# Patient Record
Sex: Female | Born: 1950 | Race: White | Hispanic: No | Smoking: Former smoker
Health system: Southern US, Community
[De-identification: ages and names within clinical notes are randomized; demographics above are authoritative.]

## PROBLEM LIST (undated history)

## (undated) DIAGNOSIS — R599 Enlarged lymph nodes, unspecified: Secondary | ICD-10-CM

## (undated) DIAGNOSIS — K219 Gastro-esophageal reflux disease without esophagitis: Secondary | ICD-10-CM

## (undated) DIAGNOSIS — C801 Malignant (primary) neoplasm, unspecified: Secondary | ICD-10-CM

## (undated) DIAGNOSIS — H269 Unspecified cataract: Secondary | ICD-10-CM

## (undated) DIAGNOSIS — E785 Hyperlipidemia, unspecified: Secondary | ICD-10-CM

## (undated) HISTORY — DX: Hyperlipidemia, unspecified: E78.5

## (undated) HISTORY — DX: Gastro-esophageal reflux disease without esophagitis: K21.9

## (undated) HISTORY — DX: Unspecified cataract: H26.9

## (undated) HISTORY — PX: COLONOSCOPY: SHX174

## (undated) HISTORY — PX: RADIAL KERATOTOMY: SHX217

## (undated) HISTORY — PX: BREAST SURGERY: SHX581

## (undated) HISTORY — DX: Enlarged lymph nodes, unspecified: R59.9

## (undated) HISTORY — DX: Malignant (primary) neoplasm, unspecified: C80.1

---

## 1997-10-04 LAB — HM HEPATITIS C SCREENING LAB: HM HEPATITIS C SCREENING: NEGATIVE

## 1999-10-05 DIAGNOSIS — C801 Malignant (primary) neoplasm, unspecified: Secondary | ICD-10-CM

## 1999-10-05 HISTORY — DX: Malignant (primary) neoplasm, unspecified: C80.1

## 2000-08-17 ENCOUNTER — Encounter (INDEPENDENT_AMBULATORY_CARE_PROVIDER_SITE_OTHER): Payer: Self-pay | Admitting: Specialist

## 2000-08-17 ENCOUNTER — Other Ambulatory Visit: Admission: RE | Admit: 2000-08-17 | Discharge: 2000-08-17 | Payer: Self-pay | Admitting: Radiology

## 2000-09-22 ENCOUNTER — Other Ambulatory Visit: Admission: RE | Admit: 2000-09-22 | Discharge: 2000-09-22 | Payer: Self-pay | Admitting: Surgery

## 2000-09-30 ENCOUNTER — Encounter: Admission: RE | Admit: 2000-09-30 | Discharge: 2000-12-29 | Payer: Self-pay | Admitting: Radiation Oncology

## 2000-10-10 ENCOUNTER — Other Ambulatory Visit: Admission: RE | Admit: 2000-10-10 | Discharge: 2000-10-10 | Payer: Self-pay | Admitting: Surgery

## 2000-10-10 ENCOUNTER — Encounter (INDEPENDENT_AMBULATORY_CARE_PROVIDER_SITE_OTHER): Payer: Self-pay | Admitting: Specialist

## 2003-05-14 ENCOUNTER — Other Ambulatory Visit: Admission: RE | Admit: 2003-05-14 | Discharge: 2003-05-14 | Payer: Self-pay | Admitting: Obstetrics and Gynecology

## 2004-05-25 ENCOUNTER — Other Ambulatory Visit: Admission: RE | Admit: 2004-05-25 | Discharge: 2004-05-25 | Payer: Self-pay | Admitting: Obstetrics and Gynecology

## 2004-07-27 ENCOUNTER — Other Ambulatory Visit: Admission: RE | Admit: 2004-07-27 | Discharge: 2004-07-27 | Payer: Self-pay | Admitting: Radiology

## 2005-02-12 ENCOUNTER — Ambulatory Visit: Payer: Self-pay | Admitting: Oncology

## 2005-05-31 ENCOUNTER — Other Ambulatory Visit: Admission: RE | Admit: 2005-05-31 | Discharge: 2005-05-31 | Payer: Self-pay | Admitting: Obstetrics and Gynecology

## 2005-09-28 ENCOUNTER — Ambulatory Visit: Payer: Self-pay | Admitting: Oncology

## 2006-06-27 ENCOUNTER — Other Ambulatory Visit: Admission: RE | Admit: 2006-06-27 | Discharge: 2006-06-27 | Payer: Self-pay | Admitting: Obstetrics and Gynecology

## 2006-09-22 ENCOUNTER — Ambulatory Visit: Payer: Self-pay | Admitting: Oncology

## 2006-10-04 DIAGNOSIS — N893 Dysplasia of vagina, unspecified: Secondary | ICD-10-CM | POA: Insufficient documentation

## 2006-10-31 LAB — COMPREHENSIVE METABOLIC PANEL
Alkaline Phosphatase: 51 U/L (ref 39–117)
Glucose, Bld: 135 mg/dL — ABNORMAL HIGH (ref 70–99)
Sodium: 139 mEq/L (ref 135–145)
Total Bilirubin: 0.4 mg/dL (ref 0.3–1.2)
Total Protein: 6.4 g/dL (ref 6.0–8.3)

## 2006-10-31 LAB — CBC WITH DIFFERENTIAL/PLATELET
Eosinophils Absolute: 0.1 10*3/uL (ref 0.0–0.5)
LYMPH%: 28.7 % (ref 14.0–48.0)
MCH: 31.5 pg (ref 26.0–34.0)
MCHC: 34.2 g/dL (ref 32.0–36.0)
MCV: 92.1 fL (ref 81.0–101.0)
MONO%: 5.1 % (ref 0.0–13.0)
NEUT#: 4.7 10*3/uL (ref 1.5–6.5)
Platelets: 219 10*3/uL (ref 145–400)
RBC: 3.79 10*6/uL (ref 3.70–5.32)

## 2006-10-31 LAB — LACTATE DEHYDROGENASE: LDH: 151 U/L (ref 94–250)

## 2007-08-07 ENCOUNTER — Other Ambulatory Visit: Admission: RE | Admit: 2007-08-07 | Discharge: 2007-08-07 | Payer: Self-pay | Admitting: Obstetrics and Gynecology

## 2012-02-24 ENCOUNTER — Encounter: Payer: Self-pay | Admitting: Internal Medicine

## 2012-02-24 ENCOUNTER — Ambulatory Visit (INDEPENDENT_AMBULATORY_CARE_PROVIDER_SITE_OTHER): Payer: BC Managed Care – PPO | Admitting: Internal Medicine

## 2012-02-24 VITALS — BP 126/78 | HR 70 | Temp 97.8°F | Resp 18 | Ht 67.0 in | Wt 174.2 lb

## 2012-02-24 DIAGNOSIS — Z789 Other specified health status: Secondary | ICD-10-CM

## 2012-02-24 DIAGNOSIS — C50919 Malignant neoplasm of unspecified site of unspecified female breast: Secondary | ICD-10-CM | POA: Insufficient documentation

## 2012-02-24 DIAGNOSIS — F39 Unspecified mood [affective] disorder: Secondary | ICD-10-CM

## 2012-02-24 DIAGNOSIS — J301 Allergic rhinitis due to pollen: Secondary | ICD-10-CM

## 2012-02-24 DIAGNOSIS — J019 Acute sinusitis, unspecified: Secondary | ICD-10-CM

## 2012-02-24 DIAGNOSIS — R05 Cough: Secondary | ICD-10-CM

## 2012-02-24 DIAGNOSIS — J329 Chronic sinusitis, unspecified: Secondary | ICD-10-CM

## 2012-02-24 DIAGNOSIS — R5383 Other fatigue: Secondary | ICD-10-CM

## 2012-02-24 LAB — POCT CBC
Granulocyte percent: 54.2 %G (ref 37–80)
HCT, POC: 41.1 % (ref 37.7–47.9)
Hemoglobin: 13.6 g/dL (ref 12.2–16.2)
Lymph, poc: 2.3 (ref 0.6–3.4)
MCH, POC: 30.8 pg (ref 27–31.2)
MCHC: 33.1 g/dL (ref 31.8–35.4)
MCV: 9.31 fL — AB (ref 80–97)
MID (cbc): 0.5 (ref 0–0.9)
MPV: 12 fL (ref 0–99.8)
POC Granulocyte: 3.3 (ref 2–6.9)
POC LYMPH PERCENT: 37.9 %L (ref 10–50)
POC MID %: 7.9 %M (ref 0–12)
Platelet Count, POC: 278 10*3/uL (ref 142–424)
RBC: 4.41 M/uL (ref 4.04–5.48)
RDW, POC: 13.5 %
WBC: 6 10*3/uL (ref 4.6–10.2)

## 2012-02-24 LAB — COMPREHENSIVE METABOLIC PANEL
ALT: 15 U/L (ref 0–35)
AST: 17 U/L (ref 0–37)
Albumin: 4.4 g/dL (ref 3.5–5.2)
Alkaline Phosphatase: 60 U/L (ref 39–117)
Calcium: 9.5 mg/dL (ref 8.4–10.5)
Chloride: 105 mEq/L (ref 96–112)
Creat: 0.68 mg/dL (ref 0.50–1.10)
Potassium: 4.4 mEq/L (ref 3.5–5.3)

## 2012-02-24 LAB — TSH: TSH: 2.267 u[IU]/mL (ref 0.350–4.500)

## 2012-02-24 MED ORDER — HYDROCODONE-ACETAMINOPHEN 7.5-500 MG/15ML PO SOLN: 5.0000 mL | Freq: Four times a day (QID) | ORAL | Status: AC | PRN

## 2012-02-24 MED ORDER — FLUTICASONE PROPIONATE 50 MCG/ACT NA SUSP: 2.0000 | Freq: Every day | NASAL | Status: DC

## 2012-02-24 MED ORDER — CEFTRIAXONE SODIUM 1 G IJ SOLR
1.0000 g | INTRAMUSCULAR | Status: DC
  Administered 2012-02-24: 1 g via INTRAMUSCULAR

## 2012-02-24 MED ORDER — AMOXICILLIN 500 MG PO CAPS: 1000.0000 mg | ORAL_CAPSULE | Freq: Two times a day (BID) | ORAL | Status: AC

## 2012-02-24 NOTE — Patient Instructions (Signed)

## 2012-02-24 NOTE — Progress Notes (Signed)
  Subjective:    Patient ID: Theresa Pena, female    DOB: 1951/01/13, 61 y.o.   MRN: [id number]  HPI Has had congestion and fatigue, has spring allergies, no smoke and no asthma. Has copious reen foul discharge out of Right nostril only   Review of Systems Healthy    Objective:   Physical Exam  Constitutional: She is oriented to person, place, and time. She appears well-developed and well-nourished.  HENT:  Right Ear: External ear normal.  Left Ear: External ear normal.  Nose: Mucosal edema, rhinorrhea and sinus tenderness present. Right sinus exhibits maxillary sinus tenderness.  Mouth/Throat: Oropharynx is clear and moist.  Cardiovascular: Normal rate, regular rhythm and normal heart sounds.   Pulmonary/Chest: Effort normal and breath sounds normal.  Neurological: She is alert and oriented to person, place, and time. Coordination normal.  Skin: Skin is warm and dry.  Psychiatric: She has a normal mood and affect.   Cbc,cmet,tsh Results for orders placed in visit on 02/24/12  POCT CBC      Component Value Range   WBC 6.0  4.6 - 10.2 (K/uL)   Lymph, poc 2.3  0.6 - 3.4    POC LYMPH PERCENT 37.9  10 - 50 (%L)   MID (cbc) 0.5  0 - 0.9    POC MID % 7.9  0 - 12 (%M)   POC Granulocyte 3.3  2 - 6.9    Granulocyte percent 54.2  37 - 80 (%G)   RBC 4.41  4.04 - 5.48 (M/uL)   Hemoglobin 13.6  12.2 - 16.2 (g/dL)   HCT, POC 14.7  82.9 - 47.9 (%)   MCV 9.31 (*) 80 - 97 (fL)   MCH, POC 30.8  27 - 31.2 (pg)   MCHC 33.1  31.8 - 35.4 (g/dL)   RDW, POC 56.2     Platelet Count, POC 278  142 - 424 (K/uL)   MPV 12.0  0 - 99.8 (fL)         Assessment & Plan:  Schedule CPE soon Rocephin 1g Lortab elixir, amoxil1g

## 2013-08-24 ENCOUNTER — Ambulatory Visit (INDEPENDENT_AMBULATORY_CARE_PROVIDER_SITE_OTHER): Payer: BC Managed Care – PPO | Admitting: Physician Assistant

## 2013-08-24 VITALS — BP 106/64 | HR 68 | Temp 99.0°F | Resp 18 | Ht 66.75 in | Wt 179.0 lb

## 2013-08-24 DIAGNOSIS — H6990 Unspecified Eustachian tube disorder, unspecified ear: Secondary | ICD-10-CM

## 2013-08-24 DIAGNOSIS — J3489 Other specified disorders of nose and nasal sinuses: Secondary | ICD-10-CM

## 2013-08-24 DIAGNOSIS — R0981 Nasal congestion: Secondary | ICD-10-CM

## 2013-08-24 DIAGNOSIS — H6993 Unspecified Eustachian tube disorder, bilateral: Secondary | ICD-10-CM

## 2013-08-24 DIAGNOSIS — H6980 Other specified disorders of Eustachian tube, unspecified ear: Secondary | ICD-10-CM

## 2013-08-24 DIAGNOSIS — J329 Chronic sinusitis, unspecified: Secondary | ICD-10-CM

## 2013-08-24 DIAGNOSIS — H6983 Other specified disorders of Eustachian tube, bilateral: Secondary | ICD-10-CM

## 2013-08-24 MED ORDER — IPRATROPIUM BROMIDE 0.03 % NA SOLN: 2.0000 | Freq: Two times a day (BID) | NASAL | Status: DC

## 2013-08-24 MED ORDER — AMOXICILLIN-POT CLAVULANATE 875-125 MG PO TABS: 1.0000 | ORAL_TABLET | Freq: Two times a day (BID) | ORAL | Status: DC

## 2013-08-24 NOTE — Progress Notes (Signed)
  Subjective:    Patient ID: Theresa Pena, female    DOB: Apr 25, 1951, 62 y.o.   MRN: [id number]  Cough Associated symptoms include rhinorrhea. Pertinent negatives include no chest pain, chills, ear pain, fever, headaches, sore throat, shortness of breath or wheezing.   62 year old female presents for evaluation of nasal congestion, sinus pressure, bilateral ear pressure/popping, cough, and PND.  Denies fever, chills, nausea, vomiting, headache, dizziness, otalgia, sore throat, SOB, wheezing, or hemoptysis.  She has been taking OTC dayquil/nyquil which has not helped much.  She does have a hx of similar illness each fall. No known sick contacts.  Patient is otherwise doing well with no other concerns today.     Review of Systems  Constitutional: Negative for fever and chills.  HENT: Positive for congestion, rhinorrhea and sinus pressure. Negative for ear pain, sore throat and trouble swallowing.   Respiratory: Positive for cough. Negative for chest tightness, shortness of breath and wheezing.   Cardiovascular: Negative for chest pain.  Gastrointestinal: Negative for nausea, vomiting and abdominal pain.  Neurological: Negative for dizziness and headaches.       Objective:   Physical Exam  Constitutional: She is oriented to person, place, and time. She appears well-developed and well-nourished.  HENT:  Head: Normocephalic and atraumatic.  Right Ear: Hearing, external ear and ear canal normal. A middle ear effusion is present.  Left Ear: Hearing, external ear and ear canal normal. A middle ear effusion is present.  Mouth/Throat: Uvula is midline, oropharynx is clear and moist and mucous membranes are normal.  Eyes: Conjunctivae are normal.  Neck: Normal range of motion. Neck supple.  Cardiovascular: Normal rate, regular rhythm and normal heart sounds.   Pulmonary/Chest: Effort normal and breath sounds normal.  Lymphadenopathy:    She has no cervical adenopathy.  Neurological: She is  alert and oriented to person, place, and time.  Psychiatric: She has a normal mood and affect. Her behavior is normal. Judgment and thought content normal.          Assessment & Plan:  Nasal congestion - Plan: ipratropium (ATROVENT) 0.03 % nasal spray  Sinusitis - Plan: amoxicillin-clavulanate (AUGMENTIN) 875-125 MG per tablet  ETD (eustachian tube dysfunction), bilateral - Plan: ipratropium (ATROVENT) 0.03 % nasal spray  Will treat with Augmentin 875 mg bid x 10 days Atrovent NS twice daily to help with PND and ETD Recommend OTC Delsym for cough. Ok to rx tessalon perles if needed May also take Claritin or Zyrtec daily to help with ETD RTC if symptoms worsening or fail to improve.

## 2013-11-06 ENCOUNTER — Ambulatory Visit (INDEPENDENT_AMBULATORY_CARE_PROVIDER_SITE_OTHER): Payer: BC Managed Care – PPO | Admitting: Family Medicine

## 2013-11-06 VITALS — BP 122/76 | HR 88 | Temp 98.9°F | Resp 16 | Ht 66.0 in | Wt 179.0 lb

## 2013-11-06 DIAGNOSIS — R05 Cough: Secondary | ICD-10-CM

## 2013-11-06 DIAGNOSIS — J101 Influenza due to other identified influenza virus with other respiratory manifestations: Secondary | ICD-10-CM

## 2013-11-06 DIAGNOSIS — R059 Cough, unspecified: Secondary | ICD-10-CM

## 2013-11-06 LAB — POCT INFLUENZA A/B
Influenza A, POC: POSITIVE
Influenza B, POC: NEGATIVE

## 2013-11-06 MED ORDER — OSELTAMIVIR PHOSPHATE 75 MG PO CAPS: 75.0000 mg | ORAL_CAPSULE | Freq: Two times a day (BID) | ORAL | Status: DC

## 2013-11-06 MED ORDER — HYDROCODONE-HOMATROPINE 5-1.5 MG/5ML PO SYRP: 5.0000 mL | ORAL_SOLUTION | Freq: Three times a day (TID) | ORAL | Status: DC | PRN

## 2013-11-06 NOTE — Patient Instructions (Signed)

## 2013-11-06 NOTE — Progress Notes (Signed)
Subjective:  This chart was scribed for Robyn Haber, MD by Donato Schultz, Medical Scribe. This patient was seen in Room 5 and the patient's care was started at 8:38 PM.   Patient ID: Theresa Pena, female    DOB: 04/06/1951, 63 y.o.   MRN: [id number]  HPI HPI Comments: Theresa Pena is a 63 y.o. female who presents to the [facility] complaining of constant voice change and cough that started two days ago.  She states that her symptoms started with chills.  The patient lists abdominal pain and decreased appetite as associated symptoms.  She denies myalgias as an associated symptom.The patient denies getting the flu shot.       Past Medical History  Diagnosis Date   Cancer    Past Surgical History  Procedure Laterality Date   Breast surgery     Family History  Problem Relation Age of Onset   Hyperlipidemia Mother    History   Social History   Marital Status: Single    Spouse Name: N/A    Number of Children: N/A   Years of Education: N/A   Occupational History   Not on file.   Social History Main Topics   Smoking status: Current Every Day Smoker   Smokeless tobacco: Not on file   Alcohol Use: 3.0 oz/week    6 drink(s) per week   Drug Use: No   Sexual Activity: Not on file   Other Topics Concern   Not on file   Social History Narrative   No narrative on file   Allergies  Allergen Reactions   Codeine Nausea Only    Also has dizziness    Review of Systems  Constitutional: Positive for chills and appetite change (decreased).  HENT: Positive for voice change.   Respiratory: Positive for cough.   Gastrointestinal: Positive for abdominal pain.  Musculoskeletal: Negative for myalgias.  All other systems reviewed and are negative.       Objective:  Physical Exam  Nursing note and vitals reviewed. Constitutional: She is oriented to person, place, and time. She appears well-developed and well-nourished. No distress.    HENT:  Head: Normocephalic and atraumatic.  Eyes: EOM are normal.  Neck: Neck supple. No tracheal deviation present.  Cardiovascular: Normal rate.   Pulmonary/Chest: Effort normal and breath sounds normal. No respiratory distress.  Musculoskeletal: Normal range of motion.  Neurological: She is alert and oriented to person, place, and time.  Skin: Skin is warm and dry.  Psychiatric: She has a normal mood and affect. Her behavior is normal.   Chest:  insp and exp ronchi  Results for orders placed in visit on 11/06/13  POCT INFLUENZA A/B      Result Value Range   Influenza A, POC Positive     Influenza B, POC Negative         BP 122/76   Pulse 88   Temp(Src) 98.9 F (37.2 C) (Oral)   Resp 16   Ht 5\' 6"  (1.676 m)   Wt 179 lb (81.194 kg)   BMI 28.91 kg/m2   SpO2 97%  Assessment & Plan:    I personally performed the services described in this documentation, which was scribed in my presence. The recorded information has been reviewed and is accurate.  Influenza A (H1N1) - Plan: oseltamivir (TAMIFLU) 75 MG capsule, HYDROcodone-homatropine (HYCODAN) 5-1.5 MG/5ML syrup  Cough - Plan: POCT Influenza A/B, HYDROcodone-homatropine (HYCODAN) 5-1.5 MG/5ML syrup  Signed, Robyn Haber, MD

## 2015-02-17 ENCOUNTER — Ambulatory Visit: Payer: Self-pay | Admitting: Internal Medicine

## 2015-03-14 ENCOUNTER — Ambulatory Visit (INDEPENDENT_AMBULATORY_CARE_PROVIDER_SITE_OTHER): Payer: 59 | Admitting: Internal Medicine

## 2015-03-14 ENCOUNTER — Encounter: Payer: Self-pay | Admitting: Internal Medicine

## 2015-03-14 VITALS — BP 120/92 | HR 65 | Temp 98.2°F | Resp 18 | Ht 67.0 in | Wt 180.8 lb

## 2015-03-14 DIAGNOSIS — Z0000 Encounter for general adult medical examination without abnormal findings: Secondary | ICD-10-CM

## 2015-03-14 DIAGNOSIS — Z85820 Personal history of malignant melanoma of skin: Secondary | ICD-10-CM

## 2015-03-14 DIAGNOSIS — G4700 Insomnia, unspecified: Secondary | ICD-10-CM | POA: Diagnosis not present

## 2015-03-14 DIAGNOSIS — F411 Generalized anxiety disorder: Secondary | ICD-10-CM | POA: Diagnosis not present

## 2015-03-14 DIAGNOSIS — Z853 Personal history of malignant neoplasm of breast: Secondary | ICD-10-CM | POA: Insufficient documentation

## 2015-03-14 NOTE — Progress Notes (Signed)
Patient ID: Theresa Pena, female   DOB: 24-Mar-1951, 65 y.o.   MRN: [id number]    Location:    P[facility]    Place of Service:    OFFICE   Advanced Directive information   [facility] POA and Living Will  Chief Complaint  Patient presents with  . Establish Care    Establish care    HPI:  64 yo female seen today as a new pt. She has never had a primary care provider. She had diarrhea from mid Feb through mid May but it has resolved. No bloody stools. Last colonoscopy in her 76s and it she had 1 polyp removed that was benign. She had fatigue but that has resolved also. No N/V. She has IBS and had a duodenal ulcer in her 28s. She takes zegerid which helps. She was taking a lot of Magnesium but has reduced dose and that helped with diarrhea. She was gaining weight with diarrhea but that calmed down also. Now at baseline weight.   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 She needs referral to see derm for annual skin check.  She has a past hx right breast cancer in 2001 s/p lumpectomy x 2 and XRT. No chemotx req'd. She completed 5 yrs of tamoxifen tx. Her GYN is at [facility]. Last visit 2 weeks ago and exam nml. 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 Cancer     Past Surgical History  Procedure Laterality Date  . Breast surgery      Patient Care Team: Harle Battiest, MD as PCP - General (Obstetrics and Gynecology)  History   Social History  . Marital Status: Divorced    Spouse Name: N/A  . Number of Children: N/A  . Years of Education: N/A   Occupational History  . Not on file.   Social History Main Topics  . Smoking status: Former Research scientist (life sciences)  . Smokeless tobacco: Never Used  . Alcohol Use: 3.0 - 4.2 oz/week    5-7 Standard drinks or equivalent per week  . Drug Use: No  . Sexual Activity: Not on file   Other Topics Concern  . Not on file   Social History Narrative    Diet:      Do you drink/ eat things with caffeine? Coffee 2-3 cups /day      Marital status:  Divorced                             What year were you married ? 2004      Do you live in a house, apartment,assistred living, condo, trailer, etc.)? House      Is it one or more stories? 1      How many persons live in your home ? 1      Do you have any pets in your home ?(please list)  1 dog      Current or past profession: Residential cleaning      Do you exercise?                              Type & how often: Rarely      Do you have a living will? Yes      Do you have a DNR form?  No                    If not, do you want to discuss one? Yes      Do you have signed POA?HPOA forms?   Yes              If so, please bring to your        appointment           reports that she has quit smoking. She has never used smokeless tobacco. She reports that she drinks about 3.0 - 4.2 oz of alcohol per week. She reports that she does not use illicit drugs.  Family History  Problem Relation Age of Onset  . Hyperlipidemia Mother    Family Status  Relation Status Death Age  . Mother Alive   . Father Deceased   . Sister Alive   . Maternal Grandmother Deceased   . Maternal Grandfather Deceased   . Paternal Grandmother Deceased   . Paternal Grandfather Deceased      There is no immunization history on file for this patient.  Allergies  Allergen Reactions  . Codeine Nausea Only    Also has dizziness    Medications: Patient's Medications  New Prescriptions   No medications on file  Previous Medications   CHOLECALCIFEROL (VITAMIN D) 1000 UNITS TABLET    Take 1,000 Units by mouth daily. Patient unsure of dosage   FISH OIL-OMEGA-3 FATTY ACIDS 1000 MG CAPSULE    Take 1 g by mouth daily. Not sure why she takes this. Not working for fatigue   FLUTICASONE (FLONASE) 50 MCG/ACT NASAL SPRAY    Place 2 sprays into the nose daily.   LORAZEPAM (ATIVAN) 1 MG TABLET    Take 1 tablet by  mouth 3 times a day   MULTIPLE VITAMIN (MULTIVITAMIN) TABLET    Take 1 tablet by mouth daily.   RA CALCIUM HI-CAL PO    Take by mouth. Take 3 tablets 1 time daily   VENLAFAXINE (EFFEXOR) 37.5 MG TABLET    Take 37.5 mg by mouth 2 (two) times daily.   ZOLPIDEM (AMBIEN) 10 MG TABLET    Take 10 mg by mouth once.  Modified Medications   No medications on file  Discontinued Medications   CALCIUM CARBONATE (OS-CAL) 600 MG TABS    Take 600 mg by mouth 2 (two) times daily with a meal. Patient unsure of dosage   HYDROCODONE-HOMATROPINE (HYCODAN) 5-1.5 MG/5ML SYRUP    Take 5 mLs by mouth every 8 (eight) hours as needed for cough.   IPRATROPIUM (ATROVENT) 0.03 % NASAL SPRAY    Place 2 sprays into the nose 2 (two) times daily.   OSELTAMIVIR (TAMIFLU) 75 MG CAPSULE    Take 1 capsule (75 mg total) by mouth 2 (two) times daily.    Review of Systems  Constitutional: Positive for unexpected weight change (weight gain). Negative for fever, chills, diaphoresis, activity change, appetite change and fatigue.  HENT: Negative for ear pain and sore throat.   Eyes: Positive for visual disturbance (eyeglasses).       Dry eyes; cats  Respiratory: Negative for cough, chest tightness and shortness of breath.   Cardiovascular: Negative for chest pain, palpitations and leg swelling.  Gastrointestinal: Positive for diarrhea. Negative for nausea, vomiting, abdominal pain, constipation and blood in stool.  Genitourinary: Negative for dysuria.       Urinary incontinence  Musculoskeletal: Negative for arthralgias.       Joint  stiffness  Neurological: Positive for dizziness (occasional). Negative for tremors, numbness and headaches.  Psychiatric/Behavioral: Negative for sleep disturbance. The patient is not nervous/anxious.     Filed Vitals:   03/14/15 0823  BP: 120/92  Pulse: 65  Temp: 98.2 F (36.8 C)  TempSrc: Oral  Resp: 18  Height: 5\' 7"  (1.702 m)  Weight: 180 lb 12.8 oz (82.01 kg)  SpO2: 97%   Body mass  index is 28.31 kg/(m^2).  Physical Exam  Constitutional: She is oriented to person, place, and time. She appears well-developed and well-nourished.  HENT:  Mouth/Throat: Oropharynx is clear and moist. No oropharyngeal exudate.  Eyes: Pupils are equal, round, and reactive to light. No scleral icterus.  Neck: Neck supple. Carotid bruit is not present. No tracheal deviation present. No thyromegaly present.  Cardiovascular: Normal rate, regular rhythm, normal heart sounds and intact distal pulses.  Exam reveals no gallop and no friction rub.   No murmur heard. No LE edema b/l. no calf TTP.   Pulmonary/Chest: Effort normal and breath sounds normal. No stridor. No respiratory distress. She has no wheezes. She has no rales.  Abdominal: Soft. Bowel sounds are normal. She exhibits no distension and no mass. There is no hepatosplenomegaly. There is no tenderness. There is no rebound and no guarding.  Lymphadenopathy:    She has no cervical adenopathy.  Neurological: She is alert and oriented to person, place, and time. She has normal reflexes.  Skin: Skin is warm and dry. No rash noted.  Psychiatric: She has a normal mood and affect. Her behavior is normal. Judgment and thought content normal.     Labs reviewed: No visits with results within 3 Month(s) from this visit. Latest known visit with results is:  Office Visit on 11/06/2013  Component Date Value Ref Range Status  . Influenza A, POC 11/06/2013 Positive   Final  . Influenza B, POC 11/06/2013 Negative   Final    No results found.   Assessment/Plan   ICD-9-CM ICD-10-CM   1. History of melanoma V10.82 Z85.820 Ambulatory referral to Dermatology  2. Generalized anxiety disorder - stable 300.02 F41.1 CBC with Differential     CMP     TSH  3. Insomnia - stable 780.52 G47.00 TSH  4. History of breast cancer in female V10.3 Z85.3 CBC with Differential     CMP     TSH  5. Well adult exam V70.0 Z00.00 Lipid Panel   --Pt is UTD on  health maintenance. Vaccinations are UTD. Pt maintains a healthy lifestyle overall. Encouraged pt to exercise 30-45 minutes 4-5 times per week. Eat a well balanced diet. Avoid smoking. Limit alcohol intake. Wear seatbelt when riding in the car. Wear sun block (SPF >50) when spending extended times outside.  --Will call with referral appts  --Continue current medications as ordered  --bring copy of advanced directives to be scanned into chart  --Follow up in 1 yr for CPE w pap   Cylis Ayars S. Perlie Gold  [facility] [address] [place], [place] [address] [phone] Cell (Monday-Friday 8 AM - 5 PM) [phone] After 5 PM and follow prompts

## 2015-03-15 LAB — CBC WITH DIFFERENTIAL/PLATELET
Basophils Absolute: 0 10*3/uL (ref 0.0–0.2)
Basos: 1 %
EOS (ABSOLUTE): 0.1 10*3/uL (ref 0.0–0.4)
EOS: 2 %
Hematocrit: 41.2 % (ref 34.0–46.6)
Hemoglobin: 13.6 g/dL (ref 11.1–15.9)
IMMATURE GRANULOCYTES: 0 %
Immature Grans (Abs): 0 10*3/uL (ref 0.0–0.1)
Lymphocytes Absolute: 1.9 10*3/uL (ref 0.7–3.1)
Lymphs: 37 %
MCH: 30.8 pg (ref 26.6–33.0)
MCHC: 33 g/dL (ref 31.5–35.7)
MCV: 93 fL (ref 79–97)
MONOCYTES: 11 %
Monocytes Absolute: 0.6 10*3/uL (ref 0.1–0.9)
NEUTROS PCT: 49 %
Neutrophils Absolute: 2.6 10*3/uL (ref 1.4–7.0)
Platelets: 249 10*3/uL (ref 150–379)
RBC: 4.42 x10E6/uL (ref 3.77–5.28)
RDW: 13.9 % (ref 12.3–15.4)
WBC: 5.2 10*3/uL (ref 3.4–10.8)

## 2015-03-15 LAB — LIPID PANEL
Chol/HDL Ratio: 3.5 ratio units (ref 0.0–4.4)
Cholesterol, Total: 284 mg/dL — ABNORMAL HIGH (ref 100–199)
HDL: 82 mg/dL (ref >39)
LDL CALC: 182 mg/dL — AB (ref 0–99)
Triglycerides: 101 mg/dL (ref 0–149)
VLDL Cholesterol Cal: 20 mg/dL (ref 5–40)

## 2015-03-15 LAB — COMPREHENSIVE METABOLIC PANEL
ALBUMIN: 4.5 g/dL (ref 3.6–4.8)
ALT: 15 IU/L (ref 0–32)
AST: 22 IU/L (ref 0–40)
Albumin/Globulin Ratio: 1.7 (ref 1.1–2.5)
Alkaline Phosphatase: 51 IU/L (ref 39–117)
BUN / CREAT RATIO: 12 (ref 11–26)
BUN: 9 mg/dL (ref 8–27)
Bilirubin Total: 0.6 mg/dL (ref 0.0–1.2)
CO2: 26 mmol/L (ref 18–29)
Calcium: 9.5 mg/dL (ref 8.7–10.3)
Chloride: 99 mmol/L (ref 97–108)
Creatinine, Ser: 0.74 mg/dL (ref 0.57–1.00)
GFR calc non Af Amer: 86 mL/min/{1.73_m2} (ref >59)
GFR, EST AFRICAN AMERICAN: 99 mL/min/{1.73_m2} (ref >59)
GLUCOSE: 85 mg/dL (ref 65–99)
Globulin, Total: 2.6 g/dL (ref 1.5–4.5)
POTASSIUM: 4.5 mmol/L (ref 3.5–5.2)
Sodium: 139 mmol/L (ref 134–144)
Total Protein: 7.1 g/dL (ref 6.0–8.5)

## 2015-03-15 LAB — TSH: TSH: 3.48 u[IU]/mL (ref 0.450–4.500)

## 2015-03-16 NOTE — Patient Instructions (Addendum)
She maintains a healthy lifestyle overall. Encouraged her to exercise 30-45 minutes 4-5 times per week. Eat a well balanced diet. Avoid smoking. Limit alcohol intake. Wear seatbelt when riding in the car. Wear sun block (SPF >50) when spending extended times outside.  Will call with referral appts  Continue current medications as ordered  Bring copy of HC POA and living will to be scanned into system  Follow up in 1 yr for CPE

## 2015-03-20 ENCOUNTER — Other Ambulatory Visit: Payer: Self-pay | Admitting: *Deleted

## 2015-03-20 DIAGNOSIS — E785 Hyperlipidemia, unspecified: Secondary | ICD-10-CM

## 2015-03-20 MED ORDER — SIMVASTATIN 10 MG PO TABS: 10.0000 mg | ORAL_TABLET | Freq: Every day | ORAL | Status: DC

## 2015-03-20 NOTE — Telephone Encounter (Signed)
Medication orders and labs placed due to labwork results per Dr. Eulas Post

## 2015-06-05 ENCOUNTER — Other Ambulatory Visit: Payer: Medicare Other

## 2015-06-05 DIAGNOSIS — E785 Hyperlipidemia, unspecified: Secondary | ICD-10-CM | POA: Diagnosis not present

## 2015-06-06 LAB — ALT: ALT: 18 IU/L (ref 0–32)

## 2015-06-06 LAB — LIPID PANEL
CHOL/HDL RATIO: 3.1 ratio (ref 0.0–4.4)
Cholesterol, Total: 204 mg/dL — ABNORMAL HIGH (ref 100–199)
HDL: 65 mg/dL (ref >39)
LDL CALC: 116 mg/dL — AB (ref 0–99)
Triglycerides: 116 mg/dL (ref 0–149)
VLDL Cholesterol Cal: 23 mg/dL (ref 5–40)

## 2015-08-11 ENCOUNTER — Other Ambulatory Visit: Payer: Self-pay | Admitting: *Deleted

## 2015-08-11 MED ORDER — SIMVASTATIN 10 MG PO TABS: ORAL_TABLET | ORAL | Status: DC

## 2015-08-11 NOTE — Telephone Encounter (Signed)
[facility] 

## 2015-09-11 DIAGNOSIS — Z1231 Encounter for screening mammogram for malignant neoplasm of breast: Secondary | ICD-10-CM | POA: Diagnosis not present

## 2015-09-11 LAB — HM MAMMOGRAPHY

## 2015-09-12 ENCOUNTER — Encounter: Payer: Self-pay | Admitting: *Deleted

## 2016-01-30 ENCOUNTER — Encounter: Payer: Self-pay | Admitting: Internal Medicine

## 2016-01-30 ENCOUNTER — Ambulatory Visit (INDEPENDENT_AMBULATORY_CARE_PROVIDER_SITE_OTHER): Payer: Medicare Other | Admitting: Internal Medicine

## 2016-01-30 VITALS — BP 118/80 | HR 84 | Temp 99.2°F | Resp 20 | Ht 67.0 in | Wt 181.2 lb

## 2016-01-30 DIAGNOSIS — H6692 Otitis media, unspecified, left ear: Secondary | ICD-10-CM | POA: Diagnosis not present

## 2016-01-30 DIAGNOSIS — J301 Allergic rhinitis due to pollen: Secondary | ICD-10-CM | POA: Diagnosis not present

## 2016-01-30 MED ORDER — METHYLPREDNISOLONE 4 MG PO TBPK: ORAL_TABLET | ORAL | Status: DC

## 2016-01-30 MED ORDER — CEFUROXIME AXETIL 250 MG PO TABS: 250.0000 mg | ORAL_TABLET | Freq: Two times a day (BID) | ORAL | Status: DC

## 2016-01-30 MED ORDER — BENZONATATE 200 MG PO CAPS: 200.0000 mg | ORAL_CAPSULE | Freq: Three times a day (TID) | ORAL | Status: DC | PRN

## 2016-01-30 NOTE — Patient Instructions (Addendum)
Influenza test - negative  Push fluids and rest  Take prednisone taper as ordered  Take ceftin 2 times daily x 10 days  Recommend OTC plain claritin, allegra or zyrtec daily for seasonal allergy  Use saline nasal spray as needed to keep nose moist  Follow up as scheduled

## 2016-01-30 NOTE — Progress Notes (Signed)
Patient ID: Theresa Pena, female   DOB: 09-20-1951, 65 y.o.   MRN: [id number]    Location:    PAM   Place of Service:  OFFICE   Chief Complaint  Patient presents with  . Acute Visit    Patient c/o has bad cold and cough    HPI:  64 yo female seen today for cough and cold. She reports 1 day hx harsh cough, nonproductive. No f/c. Denies CP but has chronic SOB. She reports rhinorrhea,, sore throat. No HA/dizzinesss, f/c, nausea, sinus pressure/pain, ear pain/pressure, nasal congestion, post nasal drip. No sick contacts. Aleve helps. She has taken flonase in the past but no dx seasonal allergy. Hx breast CA  Past Medical History  Diagnosis Date  . Cancer [facility])     Past Surgical History  Procedure Laterality Date  . Breast surgery      Patient Care Team: Gildardo Cranker, DO as PCP - General (Internal Medicine)  Social History   Social History  . Marital Status: Divorced    Spouse Name: N/A  . Number of Children: N/A  . Years of Education: N/A   Occupational History  . Not on file.   Social History Main Topics  . Smoking status: Former Research scientist (life sciences)  . Smokeless tobacco: Never Used  . Alcohol Use: 3.0 - 4.2 oz/week    5-7 Standard drinks or equivalent per week  . Drug Use: No  . Sexual Activity: Not on file   Other Topics Concern  . Not on file   Social History Narrative   Diet:      Do you drink/ eat things with caffeine? Coffee 2-3 cups /day      Marital status:  Divorced                             What year were you married ? 2004      Do you live in a house, apartment,assistred living, condo, trailer, etc.)? House      Is it one or more stories? 1      How many persons live in your home ? 1      Do you have any pets in your home ?(please list)  1 dog      Current or past profession: Residential cleaning      Do you exercise?                              Type & how often: Rarely      Do you have a living will? Yes      Do you have a DNR form?   No                     If not, do you want to discuss one? Yes      Do you have signed POA?HPOA forms?   Yes              If so, please bring to your        appointment           reports that she has quit smoking. She has never used smokeless tobacco. She reports that she drinks about 3.0 - 4.2 oz of alcohol per week. She reports that she does not use illicit drugs.  Allergies  Allergen Reactions  . Codeine Nausea Only    Also has dizziness  Medications: Patient's Medications  New Prescriptions   No medications on file  Previous Medications   CHOLECALCIFEROL (VITAMIN D) 1000 UNITS TABLET    Take 1,000 Units by mouth daily. Patient unsure of dosage   FISH OIL-OMEGA-3 FATTY ACIDS 1000 MG CAPSULE    Take 1 g by mouth daily. Not sure why she takes this. Not working for fatigue   FLUTICASONE (FLONASE) 50 MCG/ACT NASAL SPRAY    Place 2 sprays into the nose daily.   LORAZEPAM (ATIVAN) 1 MG TABLET    Take 1 tablet by mouth 3 times a day   MULTIPLE VITAMIN (MULTIVITAMIN) TABLET    Take 1 tablet by mouth daily.   RA CALCIUM HI-CAL PO    Take by mouth. Take 3 tablets 1 time daily   SIMVASTATIN (ZOCOR) 10 MG TABLET    Take one tablet by mouth once daily for cholesterol   VENLAFAXINE (EFFEXOR) 37.5 MG TABLET    Take 37.5 mg by mouth 2 (two) times daily.   ZOLPIDEM (AMBIEN) 10 MG TABLET    Take 10 mg by mouth once.  Modified Medications   No medications on file  Discontinued Medications   No medications on file    Review of Systems  Constitutional: Positive for fatigue.  HENT: Positive for rhinorrhea and sore throat.   Respiratory: Positive for cough and shortness of breath.   All other systems reviewed and are negative.   Filed Vitals:   01/30/16 1034  BP: 118/80  Pulse: 84  Temp: 99.2 F (37.3 C)  TempSrc: Oral  Resp: 20  Height: 5\' 7"  (1.702 m)  Weight: 181 lb 3.2 oz (82.192 kg)  SpO2: 97%   Body mass index is 28.37 kg/(m^2).  Physical Exam  Constitutional: She is oriented to  person, place, and time. She appears well-developed and well-nourished.  Looks ill in NAD  HENT:  Right TM intact, nonbulging no redness. Left TM red, dull, nonbulging but intact. No sinus TTP. Nares with enlarged grey turbinates. Oropharynx cobblestoning and red but no exudate  Eyes: Pupils are equal, round, and reactive to light. Right eye exhibits no discharge. Left eye exhibits no discharge. No scleral icterus.  Neck: Neck supple.  Cardiovascular: Normal rate, regular rhythm, normal heart sounds and intact distal pulses.  Exam reveals no gallop and no friction rub.   No murmur heard. Pulmonary/Chest: No respiratory distress. She has no wheezes. She has no rales. She exhibits no tenderness.  Lymphadenopathy:    She has no cervical adenopathy.  Neurological: She is alert and oriented to person, place, and time.  Skin: Skin is warm and dry. No rash noted.     Labs reviewed: No visits with results within 3 Month(s) from this visit. Latest known visit with results is:  Abstract on 09/12/2015  Component Date Value Ref Range Status  . HM Mammogram 09/11/2015 [facility]: No mammographic evidence of malignancy   Final    No results found.   Assessment/Plan   ICD-9-CM ICD-10-CM   1. Acute left otitis media, recurrence not specified, unspecified otitis media type 382.9 H66.92 cefUROXime (CEFTIN) 250 MG tablet  2. Allergic rhinitis due to pollen 477.0 J30.1 methylPREDNISolone (MEDROL DOSEPAK) 4 MG TBPK tablet     benzonatate (TESSALON) 200 MG capsule    Rapid influenza test done - Neg  Push fluids and rest  Start ceftin 250mg  BID x 10 days  Start prednisone taper as ordered  Recommend OTC plain claritin, allegra or zyrtec daily for seasonal allergy  Use  saline nasal spray as needed to keep nose moist  Follow up as scheduled  Theresa Pena Theresa Pena  [facility] [address] [place], [place] [address] [phone] Cell  (Monday-Friday 8 AM - 5 PM) [phone] After 5 PM and follow prompts

## 2016-04-23 ENCOUNTER — Other Ambulatory Visit: Payer: Self-pay | Admitting: Internal Medicine

## 2016-05-24 ENCOUNTER — Other Ambulatory Visit: Payer: Self-pay | Admitting: Internal Medicine

## 2016-05-27 ENCOUNTER — Other Ambulatory Visit: Payer: Self-pay

## 2016-05-27 DIAGNOSIS — E785 Hyperlipidemia, unspecified: Secondary | ICD-10-CM

## 2016-05-27 DIAGNOSIS — Z5181 Encounter for therapeutic drug level monitoring: Secondary | ICD-10-CM

## 2016-06-03 DIAGNOSIS — H524 Presbyopia: Secondary | ICD-10-CM | POA: Diagnosis not present

## 2016-06-16 ENCOUNTER — Other Ambulatory Visit: Payer: Medicare Other

## 2016-06-16 DIAGNOSIS — Z5181 Encounter for therapeutic drug level monitoring: Secondary | ICD-10-CM

## 2016-06-16 DIAGNOSIS — E785 Hyperlipidemia, unspecified: Secondary | ICD-10-CM

## 2016-06-16 LAB — CBC WITH DIFFERENTIAL/PLATELET
Basophils Absolute: 57 cells/uL (ref 0–200)
Basophils Relative: 1 %
Eosinophils Absolute: 114 cells/uL (ref 15–500)
Eosinophils Relative: 2 %
HEMATOCRIT: 39.2 % (ref 35.0–45.0)
HEMOGLOBIN: 13.2 g/dL (ref 11.7–15.5)
LYMPHS ABS: 1938 {cells}/uL (ref 850–3900)
Lymphocytes Relative: 34 %
MCH: 31.1 pg (ref 27.0–33.0)
MCHC: 33.7 g/dL (ref 32.0–36.0)
MCV: 92.2 fL (ref 80.0–100.0)
MONO ABS: 570 {cells}/uL (ref 200–950)
MPV: 11.8 fL (ref 7.5–12.5)
Monocytes Relative: 10 %
NEUTROS PCT: 53 %
Neutro Abs: 3021 cells/uL (ref 1500–7800)
Platelets: 261 10*3/uL (ref 140–400)
RBC: 4.25 MIL/uL (ref 3.80–5.10)
RDW: 13.4 % (ref 11.0–15.0)
WBC: 5.7 10*3/uL (ref 3.8–10.8)

## 2016-06-16 LAB — COMPLETE METABOLIC PANEL WITH GFR
ALBUMIN: 3.9 g/dL (ref 3.6–5.1)
ALK PHOS: 43 U/L (ref 33–130)
ALT: 15 U/L (ref 6–29)
AST: 20 U/L (ref 10–35)
BILIRUBIN TOTAL: 0.6 mg/dL (ref 0.2–1.2)
BUN: 11 mg/dL (ref 7–25)
CALCIUM: 9.3 mg/dL (ref 8.6–10.4)
CO2: 27 mmol/L (ref 20–31)
CREATININE: 0.73 mg/dL (ref 0.50–0.99)
Chloride: 102 mmol/L (ref 98–110)
GFR, Est African American: >89 mL/min (ref >=60)
GFR, Est Non African American: 86 mL/min (ref >=60)
Glucose, Bld: 97 mg/dL (ref 65–99)
POTASSIUM: 4.7 mmol/L (ref 3.5–5.3)
Sodium: 139 mmol/L (ref 135–146)
TOTAL PROTEIN: 6.7 g/dL (ref 6.1–8.1)

## 2016-06-16 LAB — LIPID PANEL
CHOLESTEROL: 220 mg/dL — AB (ref 125–200)
HDL: 77 mg/dL (ref >=46)
LDL Cholesterol: 121 mg/dL (ref <130)
Total CHOL/HDL Ratio: 2.9 Ratio (ref <=5.0)
Triglycerides: 110 mg/dL (ref <150)
VLDL: 22 mg/dL (ref <30)

## 2016-06-16 LAB — TSH: TSH: 2.9 mIU/L

## 2016-06-18 ENCOUNTER — Encounter: Payer: Self-pay | Admitting: Internal Medicine

## 2016-06-18 ENCOUNTER — Ambulatory Visit (INDEPENDENT_AMBULATORY_CARE_PROVIDER_SITE_OTHER): Payer: Medicare Other | Admitting: Internal Medicine

## 2016-06-18 VITALS — BP 118/78 | HR 65 | Temp 98.3°F | Ht 66.54 in | Wt 181.4 lb

## 2016-06-18 DIAGNOSIS — S93401A Sprain of unspecified ligament of right ankle, initial encounter: Secondary | ICD-10-CM | POA: Diagnosis not present

## 2016-06-18 DIAGNOSIS — Z23 Encounter for immunization: Secondary | ICD-10-CM

## 2016-06-18 DIAGNOSIS — Z0000 Encounter for general adult medical examination without abnormal findings: Secondary | ICD-10-CM | POA: Diagnosis not present

## 2016-06-18 DIAGNOSIS — E2839 Other primary ovarian failure: Secondary | ICD-10-CM | POA: Diagnosis not present

## 2016-06-18 DIAGNOSIS — Z1211 Encounter for screening for malignant neoplasm of colon: Secondary | ICD-10-CM

## 2016-06-18 DIAGNOSIS — Z853 Personal history of malignant neoplasm of breast: Secondary | ICD-10-CM | POA: Diagnosis not present

## 2016-06-18 DIAGNOSIS — G4700 Insomnia, unspecified: Secondary | ICD-10-CM

## 2016-06-18 MED ORDER — ZOLPIDEM TARTRATE 10 MG PO TABS: 10.0000 mg | ORAL_TABLET | Freq: Every evening | ORAL | 0 refills | Status: DC | PRN

## 2016-06-18 NOTE — Progress Notes (Signed)
Patient ID: Radwa Fellinger, female   DOB: Mar 11, 1951, 65 y.o.   MRN: [id number]   Location:  [facility]  Place of Service:  OFFICE  Provider: Arletha Grippe, DO  Patient Care Team: Gildardo Cranker, DO as PCP - General (Internal Medicine) Webb Laws, OD as Referring Physician (Optometry) Druscilla Brownie, MD as Consulting Physician (Dermatology)  Extended Emergency Contact Information Primary Emergency Contact: Karner,Carolyn Address: [address]          [place], [place] [address] [place] Phone: [phone] Work Phone: [phone] Mobile Phone: [phone] Relation: Sister  Code Status: FULL CODE Goals of Care: Advanced Directive information Advanced Directives 06/18/2016  Does patient have an advance directive? Yes  Type of Paramedic of [place];Living will  Does patient want to make changes to advanced directive? No - Patient declined  Copy of advanced directive(s) in chart? No - copy requested     Chief Complaint  Patient presents with  . Annual Exam  . Flu Vaccine    decline    HPI: Patient is a 65 y.o. female seen in today for an annual wellness exam. She sprain her ankle several weeks ago when she stepped into a hole while walking dog. She does not want further w/u.   Last colonoscopy in her 71s and it she had 1 polyp removed that was benign. She had fatigue but that has resolved also. No N/V. She has IBS and had a duodenal ulcer in her 37s. She takes zegerid which helps. She was taking a lot of Magnesium but has reduced dose and that helped with diarrhea. She was gaining weight with diarrhea but that calmed down also. Now at baseline weight.   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 She needs referral to see derm for annual skin check.  She has a past hx right breast cancer in 2001 s/p lumpectomy x 2 and XRT. No chemotx req'd. She completed 5 yrs of tamoxifen tx. Her GYN is at [facility]. 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 Yes No No  Number falls in past yr: 1 - -  Injury with Fall? Yes - -   MMSE - Mini Mental State Exam 06/18/2016  Orientation to time 5  Orientation to Place 5  Registration 3  Attention/ Calculation 5  Recall 2  Language- name 2 objects 2  Language- repeat 1  Language- follow 3 step command 3  Language- read & follow direction 1  Write a sentence 1  Copy design 1  Total score 29     Health Maintenance  Topic Date Due  . Hepatitis C Screening  Jul 11, 1951  . TETANUS/TDAP  06/12/1969  . COLONOSCOPY  06/12/2000  . DEXA SCAN  06/13/2015  . PNA vac Low Risk Adult (1 of 2 - PCV13) 06/13/2015  . INFLUENZA VACCINE  05/04/2016  . MAMMOGRAM  09/10/2017  . ZOSTAVAX  Completed    Urinary incontinence? YES - stress incontinence  Functional Status Survey: Is the patient deaf or have difficulty hearing?: No Does the patient have difficulty seeing, even when wearing glasses/contacts?: No Does the patient have difficulty concentrating, remembering, or making decisions?: No Does the patient have  difficulty walking or climbing stairs?: No Does the patient have difficulty dressing or bathing?: No Does the patient have difficulty doing errands alone such as visiting a doctor's office or shopping?: No  Exercise? No regular routine  Diet? Mostly healthy food choices  Vision Screening Comments: Patients last eye visit was 06/03/2016 with Dr. Einar Gip  Hearing: no issues    Dentition: she gets routine cleaning on a regular basis  Pain: right ankle sprain 2 mos ago after stepping into hole. She did not seek medical attention  Past Medical History:  Diagnosis Date  . Cancer [facility])     Past Surgical History:    Procedure Laterality Date  . BREAST SURGERY      Family History  Problem Relation Age of Onset  . Hyperlipidemia Mother    Family Status  Relation Status  . Mother Alive  . Father Deceased  . Sister Alive  . Maternal Grandmother Deceased  . Maternal Grandfather Deceased  . Paternal Grandmother Deceased  . Paternal Grandfather Deceased    Social History   Social History  . Marital status: Divorced    Spouse name: N/A  . Number of children: N/A  . Years of education: N/A   Occupational History  . Not on file.   Social History Main Topics  . Smoking status: Former Research scientist (life sciences)  . Smokeless tobacco: Never Used  . Alcohol use 7.8 - 9.0 oz/week    5 - 7 Standard drinks or equivalent, 8 Glasses of wine per week  . Drug use: No  . Sexual activity: Not on file   Other Topics Concern  . Not on file   Social History Narrative   Diet:      Do you drink/ eat things with caffeine? Coffee 2-3 cups /day      Marital status:  Divorced                             What year were you married ? 2004      Do you live in a house, apartment,assistred living, condo, trailer, etc.)? House      Is it one or more stories? 1      How many persons live in your home ? 1      Do you have any pets in your home ?(please list)  1 dog      Current or past profession: Residential cleaning      Do you exercise?                              Type & how often: Rarely      Do you have a living will? Yes      Do you have a DNR form?   No                    If not, do you want to discuss one? Yes      Do you have signed POA?HPOA forms?   Yes              If so, please bring to your        appointment          Allergies  Allergen Reactions  . Codeine Nausea Only    Also has dizziness      Medication List       Accurate as of 06/18/16  3:33  PM. Always use your most recent med list.          B-12 PO Take 1 tablet by mouth daily at 3 pm.   benzonatate 200 MG capsule Commonly  known as:  TESSALON Take 1 capsule (200 mg total) by mouth 3 (three) times daily as needed for cough.   cefUROXime 250 MG tablet Commonly known as:  CEFTIN Take 1 tablet (250 mg total) by mouth 2 (two) times daily with a meal.   cholecalciferol 1000 units tablet Commonly known as:  VITAMIN D Take 1,000 Units by mouth daily. Patient unsure of dosage   fish oil-omega-3 fatty acids 1000 MG capsule Take 1 g by mouth daily. Not sure why she takes this. Not working for fatigue   fluticasone 50 MCG/ACT nasal spray Commonly known as:  FLONASE Place 2 sprays into the nose daily.   LORazepam 1 MG tablet Commonly known as:  ATIVAN Take 1 tablet by mouth 3 times a day   methylPREDNISolone 4 MG Tbpk tablet Commonly known as:  MEDROL DOSEPAK Use as directed   multivitamin tablet Take 1 tablet by mouth daily.   RA CALCIUM HI-CAL PO Take by mouth. Take 3 tablets 1 time daily   simvastatin 10 MG tablet Commonly known as:  ZOCOR Take one tablet by mouth once daily for cholesterol   venlafaxine 37.5 MG tablet Commonly known as:  EFFEXOR TAKE 1 TABLET BY MOUTH TWICE DAILY   zolpidem 10 MG tablet Commonly known as:  AMBIEN Take 10 mg by mouth once.        Review of Systems:  Review of Systems  Genitourinary: Positive for frequency and urgency.  All other systems reviewed and are negative.   Physical Exam: Vitals:   06/18/16 1451  BP: 118/78  Pulse: 65  Temp: 98.3 F (36.8 C)  TempSrc: Oral  SpO2: 95%  Weight: 181 lb 6.4 oz (82.3 kg)  Height: 5' 6.53" (1.69 m)   Body mass index is 28.81 kg/m. Physical Exam  Constitutional: She is oriented to person, place, and time. She appears well-developed and well-nourished. No distress.  HENT:  Head: Normocephalic and atraumatic.  Right Ear: Hearing, tympanic membrane, external ear and ear canal normal.  Left Ear: Hearing, tympanic membrane, external ear and ear canal normal.  Mouth/Throat: Uvula is midline, oropharynx is  clear and moist and mucous membranes are normal. She does not have dentures.  Eyes: Conjunctivae, EOM and lids are normal. Pupils are equal, round, and reactive to light. No scleral icterus.  Neck: Trachea normal and normal range of motion. Neck supple. Carotid bruit is not present. No thyroid mass and no thyromegaly present.  Cardiovascular: Normal rate, regular rhythm, normal heart sounds and intact distal pulses.  Exam reveals no gallop and no friction rub.   No murmur heard. No carotid bruit b/l. No LE edema b/l. No calf TTP.   Pulmonary/Chest: Effort normal and breath sounds normal. She has no wheezes. She has no rhonchi. She has no rales. Right breast exhibits skin change (scar tissue palpable lateral breast). Right breast exhibits no inverted nipple, no mass, no nipple discharge and no tenderness. Left breast exhibits no inverted nipple, no mass, no nipple discharge, no skin change and no tenderness. Breasts are asymmetrical.  Right breast scar  Abdominal: Soft. Normal appearance, normal aorta and bowel sounds are normal. She exhibits no pulsatile midline mass and no mass. There is no hepatosplenomegaly. There is no tenderness. There is no rigidity, no rebound and no guarding.  No hernia.  Musculoskeletal: Normal range of motion. She exhibits edema (right foot/ankle swelling with reduced ROM) and tenderness.  Lymphadenopathy:       Head (right side): No posterior auricular adenopathy present.       Head (left side): No posterior auricular adenopathy present.    She has no cervical adenopathy.       Right: No supraclavicular adenopathy present.       Left: No supraclavicular adenopathy present.  Neurological: She is alert and oriented to person, place, and time. She has normal strength and normal reflexes. No cranial nerve deficit. Gait normal.  Skin: Skin is warm, dry and intact. No rash noted. Nails show no clubbing.  Psychiatric: She has a normal mood and affect. Her speech is normal and  behavior is normal. Thought content normal. Cognition and memory are normal.    Labs reviewed:  Basic Metabolic Panel:  Recent Labs  06/16/16 0827  NA 139  K 4.7  CL 102  CO2 27  GLUCOSE 97  BUN 11  CREATININE 0.73  CALCIUM 9.3  TSH 2.90   Liver Function Tests:  Recent Labs  06/16/16 0827  AST 20  ALT 15  ALKPHOS 43  BILITOT 0.6  PROT 6.7  ALBUMIN 3.9   No results for input(s): LIPASE, AMYLASE in the last 8760 hours. No results for input(s): AMMONIA in the last 8760 hours. CBC:  Recent Labs  06/16/16 0827  WBC 5.7  NEUTROABS 3,021  HGB 13.2  HCT 39.2  MCV 92.2  PLT 261   Lipid Panel:  Recent Labs  06/16/16 0827  CHOL 220*  HDL 77  LDLCALC 121  TRIG 110  CHOLHDL 2.9   No results found for: HGBA1C  Procedures: No results found.  Assessment/Plan   ICD-9-CM ICD-10-CM   1. Well adult exam V70.0 Z00.00   2. Right ankle sprain, initial encounter 845.00 S93.401A   3. Insomnia 780.52 G47.00 zolpidem (AMBIEN) 10 MG tablet  4. History of breast cancer in female V10.3 Z85.3   5. Estrogen deficiency 256.39 E28.39 DG Bone Density  6. Colon cancer screening V76.51 Z12.11 Ambulatory referral to Gastroenterology  7. Encounter for immunization Z23 Z23 Flu Vaccine QUAD 36+ mos IM  8. Need for vaccination with 13-polyvalent pneumococcal conjugate vaccine V03.82 Z23 Pneumococcal conjugate vaccine 13-valent      Pt is UTD on health maintenance. Vaccinations are UTD. Pt maintains a healthy lifestyle. Encouraged pt to exercise 30-45 minutes 4-5 times per week. Eat a well balanced diet. Avoid smoking. Limit alcohol intake. Wear seatbelt when riding in the car. Wear sun block (SPF >50) when spending extended times outside.  T/c xray right foot/ankle if no better in next 4 weeks  Continue current medications as ordered  Follow up in 6 mos for routine visit.  Ivylynn Hoppes S. Perlie Gold  [facility] [address] [place], [place] [address] [phone] Cell (Monday-Friday 8 AM - 5 PM) [phone] After 5 PM and follow prompts

## 2016-06-18 NOTE — Patient Instructions (Signed)
Encouraged her to exercise 30-45 minutes 4-5 times per week. Eat a well balanced diet. Avoid smoking. Limit alcohol intake. Wear seatbelt when riding in the car. Wear sun block (SPF >50) when spending extended times outside.  Will call with referral/imaging appts  Flu shot and prevnar vaccine given today  Continue current medications as ordered  Follow up in 6 mos for routine visit.

## 2016-07-07 ENCOUNTER — Encounter: Payer: Self-pay | Admitting: Internal Medicine

## 2016-07-21 ENCOUNTER — Encounter: Payer: Self-pay | Admitting: Internal Medicine

## 2016-07-29 DIAGNOSIS — D485 Neoplasm of uncertain behavior of skin: Secondary | ICD-10-CM | POA: Diagnosis not present

## 2016-07-29 DIAGNOSIS — Z85820 Personal history of malignant melanoma of skin: Secondary | ICD-10-CM | POA: Diagnosis not present

## 2016-07-29 DIAGNOSIS — D235 Other benign neoplasm of skin of trunk: Secondary | ICD-10-CM | POA: Diagnosis not present

## 2016-07-29 DIAGNOSIS — L821 Other seborrheic keratosis: Secondary | ICD-10-CM | POA: Diagnosis not present

## 2016-07-29 DIAGNOSIS — L570 Actinic keratosis: Secondary | ICD-10-CM | POA: Diagnosis not present

## 2016-07-29 DIAGNOSIS — L820 Inflamed seborrheic keratosis: Secondary | ICD-10-CM | POA: Diagnosis not present

## 2016-08-13 ENCOUNTER — Encounter: Payer: Self-pay | Admitting: Internal Medicine

## 2016-08-13 ENCOUNTER — Encounter: Payer: Self-pay | Admitting: Gastroenterology

## 2016-09-10 ENCOUNTER — Telehealth: Payer: Self-pay | Admitting: Internal Medicine

## 2016-09-10 NOTE — Telephone Encounter (Signed)
left msg asking pt to confirm this lab/AWV appt w/ nurse and CPE w/ Dr. Eulas Post. VDM (DD)

## 2016-10-07 DIAGNOSIS — Z853 Personal history of malignant neoplasm of breast: Secondary | ICD-10-CM | POA: Diagnosis not present

## 2016-10-07 DIAGNOSIS — Z1231 Encounter for screening mammogram for malignant neoplasm of breast: Secondary | ICD-10-CM | POA: Diagnosis not present

## 2016-10-07 DIAGNOSIS — M8588 Other specified disorders of bone density and structure, other site: Secondary | ICD-10-CM | POA: Diagnosis not present

## 2016-10-07 DIAGNOSIS — Z780 Asymptomatic menopausal state: Secondary | ICD-10-CM | POA: Diagnosis not present

## 2016-10-07 LAB — HM DEXA SCAN

## 2016-10-07 LAB — HM MAMMOGRAPHY

## 2016-10-11 ENCOUNTER — Encounter: Payer: Self-pay | Admitting: *Deleted

## 2016-10-13 ENCOUNTER — Encounter: Payer: Self-pay | Admitting: *Deleted

## 2016-10-13 ENCOUNTER — Ambulatory Visit: Payer: Medicare Other | Admitting: *Deleted

## 2016-10-13 VITALS — Ht 67.0 in | Wt 184.2 lb

## 2016-10-13 DIAGNOSIS — Z1211 Encounter for screening for malignant neoplasm of colon: Secondary | ICD-10-CM

## 2016-10-13 NOTE — Progress Notes (Signed)
Pt presented for PV 10/13/16, scheduled colon 10/22/16. During visit pt stated that she has some upper abdominal pain issues, pain with drinking cold fluids, burning,reflux, heartburn. Has had hx of duodenal ulcer and has a cousin who had esophageal cancer. Pt requested office visit for evaluation. Per pt request cancelled colonoscopy, scheduled ov with Dr. Loletha Carrow for evaluation. If endoscopy needed, ECL can be scheduled or colonoscopy rescheduled. Pt information, history and medications completed.

## 2016-10-21 ENCOUNTER — Other Ambulatory Visit: Payer: Self-pay | Admitting: *Deleted

## 2016-10-21 ENCOUNTER — Other Ambulatory Visit: Payer: Self-pay | Admitting: Internal Medicine

## 2016-10-21 MED ORDER — ZOLPIDEM TARTRATE 10 MG PO TABS: 10.0000 mg | ORAL_TABLET | Freq: Every evening | ORAL | 0 refills | Status: DC | PRN

## 2016-10-21 NOTE — Telephone Encounter (Signed)
[facility] 

## 2016-10-22 ENCOUNTER — Encounter: Payer: Self-pay | Admitting: Gastroenterology

## 2016-11-19 ENCOUNTER — Ambulatory Visit: Payer: Medicare Other | Admitting: Gastroenterology

## 2016-11-22 ENCOUNTER — Other Ambulatory Visit: Payer: Self-pay | Admitting: Internal Medicine

## 2016-12-20 ENCOUNTER — Other Ambulatory Visit: Payer: Self-pay | Admitting: Internal Medicine

## 2016-12-24 ENCOUNTER — Encounter: Payer: Self-pay | Admitting: Gastroenterology

## 2016-12-24 ENCOUNTER — Ambulatory Visit (INDEPENDENT_AMBULATORY_CARE_PROVIDER_SITE_OTHER): Payer: Medicare Other | Admitting: Gastroenterology

## 2016-12-24 ENCOUNTER — Encounter (INDEPENDENT_AMBULATORY_CARE_PROVIDER_SITE_OTHER): Payer: Self-pay

## 2016-12-24 VITALS — BP 132/84 | Ht 67.5 in | Wt 180.0 lb

## 2016-12-24 DIAGNOSIS — Z1211 Encounter for screening for malignant neoplasm of colon: Secondary | ICD-10-CM | POA: Diagnosis not present

## 2016-12-24 DIAGNOSIS — K219 Gastro-esophageal reflux disease without esophagitis: Secondary | ICD-10-CM

## 2016-12-24 DIAGNOSIS — R1013 Epigastric pain: Secondary | ICD-10-CM

## 2016-12-24 MED ORDER — NA SULFATE-K SULFATE-MG SULF 17.5-3.13-1.6 GM/177ML PO SOLN: 1.0000 | Freq: Once | ORAL | 0 refills | Status: AC

## 2016-12-24 MED ORDER — RANITIDINE HCL 150 MG PO TABS: 150.0000 mg | ORAL_TABLET | Freq: Two times a day (BID) | ORAL | 6 refills | Status: DC

## 2016-12-24 NOTE — Progress Notes (Signed)
[facility] Consult Note:  History: Theresa Pena 12/24/2016  Referring physician: Gildardo Cranker, DO  Reason for consult/chief complaint: Gastroesophageal Reflux (stomach burning, increase of reflux in last 2 yrs) and family history of esophageal cancer (Cousin had esophgeal cancer)   Subjective  HPI:  This is a 66 year old woman recently referred by primary care for a screening colonoscopy. She reports having had a colonoscopy approximately age 52, findings are unknown with no report available. In the nurse previsit she described upper digestive symptoms and a history of an ulcer. She therefore wished to cancel her colonoscopy and wait for an office appointment to discuss her symptoms before proceeding. She describes many years of a burning dyspepsia triggered by certain foods. She does not get nausea, vomiting, dysphagia, early satiety. She has had some purposeful weight loss over the last year. She became increasingly concerned because the symptoms seemed more frequent in the last couple of years and she lost a young cousin to esophageal cancer. ROS:  Review of Systems  Constitutional: Negative for appetite change and unexpected weight change.  HENT: Negative for mouth sores and voice change.   Eyes: Negative for pain and redness.  Respiratory: Negative for cough and shortness of breath.   Cardiovascular: Negative for chest pain and palpitations.  Genitourinary: Negative for dysuria and hematuria.  Musculoskeletal: Negative for arthralgias and myalgias.  Skin: Negative for pallor and rash.  Neurological: Negative for weakness and headaches.  Hematological: Negative for adenopathy.     Past Medical History: Past Medical History:  Diagnosis Date  . Cancer ([facility]) 2001   breast   . Cataracts, bilateral   . GERD (gastroesophageal reflux disease)   . Hyperlipidemia      Past Surgical History: Past Surgical History:  Procedure Laterality Date  .  BREAST SURGERY    . RADIAL KERATOTOMY       Family History: Family History  Problem Relation Age of Onset  . Hyperlipidemia Mother   . Esophageal cancer Cousin 41  . Colon cancer Neg Hx   . Stomach cancer Neg Hx     Social History: Social History   Social History  . Marital status: Divorced    Spouse name: N/A  . Number of children: N/A  . Years of education: N/A   Social History Main Topics  . Smoking status: Former Smoker    Quit date: 10/04/1990  . Smokeless tobacco: Never Used  . Alcohol use 7.8 - 9.0 oz/week    8 Glasses of wine, 5 - 7 Standard drinks or equivalent per week  . Drug use: No  . Sexual activity: Not Asked   Other Topics Concern  . None   Social History Narrative   Diet:      Do you drink/ eat things with caffeine? Coffee 2-3 cups /day      Marital status:  Divorced                             What year were you married ? 2004      Do you live in a house, apartment,assistred living, condo, trailer, etc.)? House      Is it one or more stories? 1      How many persons live in your home ? 1      Do you have any pets in your home ?(please list)  1 dog      Current or past profession: Residential cleaning  Do you exercise?                              Type & how often: Rarely      Do you have a living will? Yes      Do you have a DNR form?   No                    If not, do you want to discuss one? Yes      Do you have signed POA?HPOA forms?   Yes              If so, please bring to your        appointment          Allergies: Allergies  Allergen Reactions  . Codeine Nausea Only    Also has dizziness  . Omeprazole Other (See Comments)    GI upset     Outpatient Meds: Current Outpatient Prescriptions  Medication Sig Dispense Refill  . cholecalciferol (VITAMIN D) 1000 UNITS tablet Take 1,000 Units by mouth daily. Patient unsure of dosage    . Cyanocobalamin (B-12 PO) Take 1 tablet by mouth daily at 3 pm.    . fish oil-omega-3  fatty acids 1000 MG capsule Take 1 g by mouth daily. Not sure why she takes this. Not working for fatigue    . LORazepam (ATIVAN) 1 MG tablet Take 1 tablet by mouth 3 times a day    . Multiple Vitamin (MULTIVITAMIN) tablet Take 1 tablet by mouth daily.    Marland Kitchen RA CALCIUM HI-CAL PO Take by mouth. Take 3 tablets 1 time daily    . ranitidine (ZANTAC) 150 MG tablet Take 1 tablet (150 mg total) by mouth 2 (two) times daily. One in the AM and 1/2 QHS 45 tablet 6  . simvastatin (ZOCOR) 10 MG tablet TAKE 1 TABLET BY MOUTH ONCE DAILY FOR CHOLESTEROL 90 tablet 1  . terbinafine (LAMISIL) 250 MG tablet Take 250 mg by mouth daily.  3  . venlafaxine (EFFEXOR) 37.5 MG tablet TAKE 1 TABLET BY MOUTH TWICE DAILY 60 tablet 0  . zolpidem (AMBIEN) 10 MG tablet Take 1 tablet (10 mg total) by mouth at bedtime as needed for sleep. 90 tablet 0  . Na Sulfate-K Sulfate-Mg Sulf 17.5-3.13-1.6 GM/180ML SOLN Take 1 kit by mouth once. 354 mL 0   No current facility-administered medications for this visit.       ___________________________________________________________________ Objective   Exam:  BP 132/84   Ht 5' 7.5" (1.715 m)   Wt 180 lb (81.6 kg)   BMI 27.78 kg/m    General: this is a(n) well-appearing woman, normal muscle mass, normal vocal quality   Eyes: sclera anicteric, no redness  ENT: oral mucosa moist without lesions, no cervical or supraclavicular lymphadenopathy, good dentition  CV: RRR without murmur, S1/S2, no JVD, no peripheral edema  Resp: clear to auscultation bilaterally, normal RR and effort noted  GI: soft, no tenderness, with active bowel sounds. No guarding or palpable organomegaly noted.  Skin; warm and dry, no rash or jaundice noted  Neuro: awake, alert and oriented x 3. Normal gross motor function and fluent speech  Assessment: Encounter Diagnoses  Name Primary?  . Gastroesophageal reflux disease, esophagitis presence not specified Yes  . Dyspepsia   . Special screening for  malignant neoplasms, colon     She carries a diagnosis of duodenal ulcer made when  she was in her early 88s, perhaps on an upper GI series. This diagnosis is somewhat in doubt. She does not take aspirin or NSAIDs. Possibly H. pylori or nonulcer dyspepsia.  Plan:  EGD Screening colonoscopy. She is agreeable to both.  The benefits and risks of the planned procedure were described in detail with the patient or (when appropriate) their health care proxy.  Risks were outlined as including, but not limited to, bleeding, infection, perforation, adverse medication reaction leading to cardiac or pulmonary decompensation, or pancreatitis (if ERCP).  The limitation of incomplete mucosal visualization was also discussed.  No guarantees or warranties were given.   Thank you for the courtesy of this consult.  Please call me with any questions or concerns.  Nelida Meuse III  CC: Gildardo Cranker, DO

## 2016-12-24 NOTE — Patient Instructions (Signed)
If you are age 67 or older, your body mass index should be between 23-30. Your Body mass index is 27.78 kg/m. If this is out of the aforementioned range listed, please consider follow up with your Primary Care Provider.  If you are age 65 or younger, your body mass index should be between 19-25. Your Body mass index is 27.78 kg/m. If this is out of the aformentioned range listed, please consider follow up with your Primary Care Provider.   You have been scheduled for an endoscopy and colonoscopy. Please follow the written instructions given to you at your visit today. Please pick up your prep supplies at the pharmacy within the next 1-3 days. If you use inhalers (even only as needed), please bring them with you on the day of your procedure. Your physician has requested that you go to www.startemmi.com and enter the access code given to you at your visit today. This web site gives a general overview about your procedure. However, you should still follow specific instructions given to you by our office regarding your preparation for the procedure.  Thank you for choosing [facility]  Dr Wilfrid Lund III

## 2017-01-05 ENCOUNTER — Telehealth: Payer: Self-pay | Admitting: Internal Medicine

## 2017-01-05 NOTE — Telephone Encounter (Signed)
left msg asking pt to call about AWV/CPE appts. explained that AWV (initial) can be done now and CPE is due in Sep. 2018. VDM (DD)

## 2017-01-14 ENCOUNTER — Encounter: Payer: Self-pay | Admitting: Gastroenterology

## 2017-01-20 ENCOUNTER — Other Ambulatory Visit: Payer: Self-pay | Admitting: Internal Medicine

## 2017-01-25 ENCOUNTER — Other Ambulatory Visit: Payer: Self-pay | Admitting: Internal Medicine

## 2017-01-28 ENCOUNTER — Ambulatory Visit (AMBULATORY_SURGERY_CENTER): Payer: Medicare Other | Admitting: Gastroenterology

## 2017-01-28 ENCOUNTER — Encounter: Payer: Self-pay | Admitting: Gastroenterology

## 2017-01-28 VITALS — BP 153/75 | HR 63 | Temp 98.9°F | Resp 14 | Ht 67.5 in | Wt 180.0 lb

## 2017-01-28 DIAGNOSIS — Z1211 Encounter for screening for malignant neoplasm of colon: Secondary | ICD-10-CM

## 2017-01-28 DIAGNOSIS — K219 Gastro-esophageal reflux disease without esophagitis: Secondary | ICD-10-CM | POA: Diagnosis not present

## 2017-01-28 DIAGNOSIS — D125 Benign neoplasm of sigmoid colon: Secondary | ICD-10-CM | POA: Diagnosis not present

## 2017-01-28 DIAGNOSIS — Z1212 Encounter for screening for malignant neoplasm of rectum: Secondary | ICD-10-CM

## 2017-01-28 DIAGNOSIS — D128 Benign neoplasm of rectum: Secondary | ICD-10-CM | POA: Diagnosis not present

## 2017-01-28 DIAGNOSIS — D129 Benign neoplasm of anus and anal canal: Secondary | ICD-10-CM

## 2017-01-28 DIAGNOSIS — K635 Polyp of colon: Secondary | ICD-10-CM

## 2017-01-28 MED ORDER — SODIUM CHLORIDE 0.9 % IV SOLN: 500.0000 mL | INTRAVENOUS | Status: DC

## 2017-01-28 NOTE — Progress Notes (Signed)
To recovery, report to Scott, RN, VSS 

## 2017-01-28 NOTE — Op Note (Signed)
[place] Patient Name: Theresa Pena Procedure Date: 01/28/2017 3:27 PM MRN: [id number] Endoscopist: Mallie Mussel L. Loletha Carrow , MD Age: 66 Referring MD:  Date of Birth: 10-Dec-1950 Gender: Female Account #: [id number] Procedure:                Colonoscopy Indications:              Screening for colorectal malignant neoplasm                            (patient reports a normal colonoscopy over 10 years                            ago) Medicines:                Monitored Anesthesia Care Procedure:                Pre-Anesthesia Assessment:                           - Prior to the procedure, a History and Physical                            was performed, and patient medications and                            allergies were reviewed. The patient's tolerance of                            previous anesthesia was also reviewed. The risks                            and benefits of the procedure and the sedation                            options and risks were discussed with the patient.                            All questions were answered, and informed consent                            was obtained. Prior Anticoagulants: The patient has                            taken no previous anticoagulant or antiplatelet                            agents. ASA Grade Assessment: II - A patient with                            mild systemic disease. After reviewing the risks                            and benefits, the patient was deemed in  satisfactory condition to undergo the procedure.                           After obtaining informed consent, the colonoscope                            was passed under direct vision. Throughout the                            procedure, the patient's blood pressure, pulse, and                            oxygen saturations were monitored continuously. The                            Colonoscope was introduced through the anus and                   advanced to the the cecum, identified by                            appendiceal orifice and ileocecal valve. The                            colonoscopy was performed without difficulty. The                            patient tolerated the procedure well. The quality                            of the bowel preparation was good. The ileocecal                            valve, appendiceal orifice, and rectum were                            photographed. The quality of the bowel preparation                            was evaluated using the BBPS [facility] Bowel                            Preparation Scale) with scores of: Right Colon = 2,                            Transverse Colon = 2 and Left Colon = 2. The total                            BBPS score equals 6. The bowel preparation used was                            SUPREP. Scope In: 3:42:14 PM Scope Out: 4:00:08 PM Scope Withdrawal Time: 0 hours 12 minutes 45 seconds  Total Procedure Duration: 0 hours 17 minutes 54 seconds  Findings:                 The perianal and digital rectal examinations were                            normal.                           Two sessile polyps were found in the rectum and                            distal sigmoid colon. The polyps were 4 mm in size.                            These polyps were removed with a cold snare.                            Resection and retrieval were complete.                           The exam was otherwise without abnormality on                            direct and retroflexion views. Complications:            No immediate complications. Estimated Blood Loss:     Estimated blood loss: none. Impression:               - Two 4 mm polyps in the rectum and in the distal                            sigmoid colon, removed with a cold snare. Resected                            and retrieved.                           - The examination was otherwise normal on direct                             and retroflexion views. Recommendation:           - Patient has a contact number available for                            emergencies. The signs and symptoms of potential                            delayed complications were discussed with the                            patient. Return to normal activities tomorrow.                            Written discharge instructions were provided to the  patient.                           - Resume previous diet.                           - Continue present medications.                           - Await pathology results.                           - Repeat colonoscopy is recommended for                            surveillance. The colonoscopy date will be                            determined after pathology results from today's                            exam become available for review. Henry L. Loletha Carrow, MD 01/28/2017 4:12:30 PM This report has been signed electronically.

## 2017-01-28 NOTE — Patient Instructions (Signed)
YOU HAD AN ENDOSCOPIC PROCEDURE TODAY AT [facility]:   Refer to the procedure report that was given to you for any specific questions about what was found during the examination.  If the procedure report does not answer your questions, please call your gastroenterologist to clarify.  If you requested that your care partner not be given the details of your procedure findings, then the procedure report has been included in a sealed envelope for you to review at your convenience later.  YOU SHOULD EXPECT: Some feelings of bloating in the abdomen. Passage of more gas than usual.  Walking can help get rid of the air that was put into your GI tract during the procedure and reduce the bloating. If you had a lower endoscopy (such as a colonoscopy or flexible sigmoidoscopy) you may notice spotting of blood in your stool or on the toilet paper. If you underwent a bowel prep for your procedure, you may not have a normal bowel movement for a few days.  Please Note:  You might notice some irritation and congestion in your nose or some drainage.  This is from the oxygen used during your procedure.  There is no need for concern and it should clear up in a day or so.  SYMPTOMS TO REPORT IMMEDIATELY:   Following lower endoscopy (colonoscopy or flexible sigmoidoscopy):  Excessive amounts of blood in the stool  Significant tenderness or worsening of abdominal pains  Swelling of the abdomen that is new, acute  Fever of 100F or higher   Following upper endoscopy (EGD)  Vomiting of blood or coffee ground material  New chest pain or pain under the shoulder blades  Painful or persistently difficult swallowing  New shortness of breath  Fever of 100F or higher  Black, tarry-looking stools  For urgent or emergent issues, a gastroenterologist can be reached at any hour by calling [phone].   DIET:  We do recommend a small meal at first, but then you may proceed to your regular diet.  Drink  plenty of fluids but you should avoid alcoholic beverages for 24 hours.  ACTIVITY:  You should plan to take it easy for the rest of today and you should NOT DRIVE or use heavy machinery until tomorrow (because of the sedation medicines used during the test).    FOLLOW UP: Our staff will call the number listed on your records the next business day following your procedure to check on you and address any questions or concerns that you may have regarding the information given to you following your procedure. If we do not reach you, we will leave a message.  However, if you are feeling well and you are not experiencing any problems, there is no need to return our call.  We will assume that you have returned to your regular daily activities without incident.  If any biopsies were taken you will be contacted by phone or by letter within the next 1-3 weeks.  Please call us at [phone] if you have not heard about the biopsies in 3 weeks.    SIGNATURES/CONFIDENTIALITY: You and/or your care partner have signed paperwork which will be entered into your electronic medical record.  These signatures attest to the fact that that the information above on your After Visit Summary has been reviewed and is understood.  Full responsibility of the confidentiality of this discharge information lies with you and/or your care-partner.  Polyp information given.  Hiatal hernia and anti reflux information given.

## 2017-01-28 NOTE — Progress Notes (Signed)
Pt's states no medical or surgical changes since previsit or office visit. 

## 2017-01-28 NOTE — Progress Notes (Signed)
Called to room to assist during endoscopic procedure.  Patient ID and intended procedure confirmed with present staff. Received instructions for my participation in the procedure from the performing physician.  

## 2017-01-28 NOTE — Op Note (Signed)
[place] Patient Name: Theresa Pena Procedure Date: 01/28/2017 3:27 PM MRN: [id number] Endoscopist: Mallie Mussel L. Loletha Carrow , MD Age: 66 Referring MD:  Date of Birth: 01-Jan-1951 Gender: Female Account #: [id number] Procedure:                Upper GI endoscopy Indications:              Heartburn Medicines:                Monitored Anesthesia Care Procedure:                Pre-Anesthesia Assessment:                           - Prior to the procedure, a History and Physical                            was performed, and patient medications and                            allergies were reviewed. The patient's tolerance of                            previous anesthesia was also reviewed. The risks                            and benefits of the procedure and the sedation                            options and risks were discussed with the patient.                            All questions were answered, and informed consent                            was obtained. Prior Anticoagulants: The patient has                            taken no previous anticoagulant or antiplatelet                            agents. ASA Grade Assessment: II - A patient with                            mild systemic disease. After reviewing the risks                            and benefits, the patient was deemed in                            satisfactory condition to undergo the procedure.                           After obtaining informed consent, the endoscope was  passed under direct vision. Throughout the                            procedure, the patient's blood pressure, pulse, and                            oxygen saturations were monitored continuously. The                            Endoscope was introduced through the mouth, and                            advanced to the second part of duodenum. The upper                            GI endoscopy was accomplished without  difficulty.                            The patient tolerated the procedure well. Scope In: Scope Out: Findings:                 A small hiatal hernia was present.                           The stomach was normal.                           The cardia and gastric fundus were normal on                            retroflexion.                           The examined duodenum was normal. Complications:            No immediate complications. Estimated Blood Loss:     Estimated blood loss: none. Impression:               - Small hiatal hernia.                           - Normal stomach.                           - Normal examined duodenum.                           - No specimens collected. Recommendation:           - Patient has a contact number available for                            emergencies. The signs and symptoms of potential                            delayed complications were discussed with the  patient. Return to normal activities tomorrow.                            Written discharge instructions were provided to the                            patient.                           - Resume previous diet.                           - Continue present medications.                           - Follow an antireflux regimen indefinitely.                           - See the other procedure note for documentation of                            additional recommendations. Davion Meara L. Loletha Carrow, MD 01/28/2017 4:10:08 PM This report has been signed electronically.

## 2017-01-31 ENCOUNTER — Telehealth: Payer: Self-pay | Admitting: *Deleted

## 2017-01-31 NOTE — Telephone Encounter (Signed)
  Follow up Call-  Call back number 01/28/2017  Post procedure Call Back phone  # [phone]  Permission to leave phone message Yes  Some recent data might be hidden     Patient questions:  Do you have a fever, pain , or abdominal swelling? No. Pain Score  0 *  Have you tolerated food without any problems? Yes.    Have you been able to return to your normal activities? Yes.    Do you have any questions about your discharge instructions: Diet   No. Medications  No. Follow up visit  No.  Do you have questions or concerns about your Care? No.  Actions: * If pain score is 4 or above: No action needed, pain <4.

## 2017-02-03 ENCOUNTER — Encounter: Payer: Self-pay | Admitting: Gastroenterology

## 2017-03-09 ENCOUNTER — Encounter: Payer: Self-pay | Admitting: Internal Medicine

## 2017-03-25 ENCOUNTER — Other Ambulatory Visit: Payer: Self-pay | Admitting: Internal Medicine

## 2017-06-20 ENCOUNTER — Ambulatory Visit: Payer: Medicare Other

## 2017-06-20 ENCOUNTER — Other Ambulatory Visit: Payer: Self-pay

## 2017-06-20 ENCOUNTER — Other Ambulatory Visit: Payer: Medicare Other

## 2017-06-20 DIAGNOSIS — E785 Hyperlipidemia, unspecified: Secondary | ICD-10-CM

## 2017-06-20 DIAGNOSIS — Z79899 Other long term (current) drug therapy: Secondary | ICD-10-CM

## 2017-06-22 ENCOUNTER — Other Ambulatory Visit: Payer: Medicare Other

## 2017-06-22 ENCOUNTER — Ambulatory Visit: Payer: Medicare Other

## 2017-06-24 ENCOUNTER — Encounter: Payer: Medicare Other | Admitting: Internal Medicine

## 2017-06-26 ENCOUNTER — Other Ambulatory Visit: Payer: Self-pay | Admitting: Gastroenterology

## 2017-06-26 ENCOUNTER — Other Ambulatory Visit: Payer: Self-pay | Admitting: Internal Medicine

## 2017-06-28 NOTE — Telephone Encounter (Signed)
Refill request for Zantac 150mg  BID.Last seen 01-2017.please advise.

## 2017-06-30 DIAGNOSIS — H25099 Other age-related incipient cataract, unspecified eye: Secondary | ICD-10-CM | POA: Diagnosis not present

## 2017-07-05 ENCOUNTER — Other Ambulatory Visit: Payer: Self-pay | Admitting: Internal Medicine

## 2017-07-22 DIAGNOSIS — Z124 Encounter for screening for malignant neoplasm of cervix: Secondary | ICD-10-CM | POA: Diagnosis not present

## 2017-07-22 DIAGNOSIS — N951 Menopausal and female climacteric states: Secondary | ICD-10-CM | POA: Insufficient documentation

## 2017-07-22 DIAGNOSIS — K219 Gastro-esophageal reflux disease without esophagitis: Secondary | ICD-10-CM | POA: Insufficient documentation

## 2017-07-22 DIAGNOSIS — Z01419 Encounter for gynecological examination (general) (routine) without abnormal findings: Secondary | ICD-10-CM | POA: Diagnosis not present

## 2017-08-18 DIAGNOSIS — L821 Other seborrheic keratosis: Secondary | ICD-10-CM | POA: Diagnosis not present

## 2017-08-18 DIAGNOSIS — D485 Neoplasm of uncertain behavior of skin: Secondary | ICD-10-CM | POA: Diagnosis not present

## 2017-08-18 DIAGNOSIS — L814 Other melanin hyperpigmentation: Secondary | ICD-10-CM | POA: Diagnosis not present

## 2017-08-18 DIAGNOSIS — L570 Actinic keratosis: Secondary | ICD-10-CM | POA: Diagnosis not present

## 2017-08-18 DIAGNOSIS — C44719 Basal cell carcinoma of skin of left lower limb, including hip: Secondary | ICD-10-CM | POA: Diagnosis not present

## 2017-08-18 DIAGNOSIS — D225 Melanocytic nevi of trunk: Secondary | ICD-10-CM | POA: Diagnosis not present

## 2017-09-20 ENCOUNTER — Other Ambulatory Visit: Payer: Medicare Other

## 2017-09-20 DIAGNOSIS — E785 Hyperlipidemia, unspecified: Secondary | ICD-10-CM

## 2017-09-20 DIAGNOSIS — Z79899 Other long term (current) drug therapy: Secondary | ICD-10-CM

## 2017-09-20 LAB — CBC WITH DIFFERENTIAL/PLATELET
BASOS ABS: 31 {cells}/uL (ref 0–200)
Basophils Relative: 0.6 %
EOS ABS: 88 {cells}/uL (ref 15–500)
Eosinophils Relative: 1.7 %
HEMATOCRIT: 37.7 % (ref 35.0–45.0)
Hemoglobin: 12.7 g/dL (ref 11.7–15.5)
LYMPHS ABS: 1560 {cells}/uL (ref 850–3900)
MCH: 31.3 pg (ref 27.0–33.0)
MCHC: 33.7 g/dL (ref 32.0–36.0)
MCV: 92.9 fL (ref 80.0–100.0)
MPV: 12.1 fL (ref 7.5–12.5)
Monocytes Relative: 8.5 %
NEUTROS PCT: 59.2 %
Neutro Abs: 3078 cells/uL (ref 1500–7800)
PLATELETS: 231 10*3/uL (ref 140–400)
RBC: 4.06 10*6/uL (ref 3.80–5.10)
RDW: 12.6 % (ref 11.0–15.0)
TOTAL LYMPHOCYTE: 30 %
WBC: 5.2 10*3/uL (ref 3.8–10.8)
WBCMIX: 442 {cells}/uL (ref 200–950)

## 2017-09-20 LAB — LIPID PANEL
CHOLESTEROL: 204 mg/dL — AB (ref <200)
HDL: 64 mg/dL (ref >50)
LDL CHOLESTEROL (CALC): 116 mg/dL — AB
Non-HDL Cholesterol (Calc): 140 mg/dL (calc) — ABNORMAL HIGH (ref <130)
Total CHOL/HDL Ratio: 3.2 (calc) (ref <5.0)
Triglycerides: 128 mg/dL (ref <150)

## 2017-09-20 LAB — COMPLETE METABOLIC PANEL WITH GFR
AG Ratio: 1.7 (calc) (ref 1.0–2.5)
ALBUMIN MSPROF: 3.9 g/dL (ref 3.6–5.1)
ALKALINE PHOSPHATASE (APISO): 41 U/L (ref 33–130)
ALT: 14 U/L (ref 6–29)
AST: 17 U/L (ref 10–35)
BUN: 15 mg/dL (ref 7–25)
CO2: 30 mmol/L (ref 20–32)
CREATININE: 0.69 mg/dL (ref 0.50–0.99)
Calcium: 9 mg/dL (ref 8.6–10.4)
Chloride: 100 mmol/L (ref 98–110)
GFR, EST AFRICAN AMERICAN: 104 mL/min/{1.73_m2} (ref >=60)
GFR, EST NON AFRICAN AMERICAN: 90 mL/min/{1.73_m2} (ref >=60)
GLOBULIN: 2.3 g/dL (ref 1.9–3.7)
Glucose, Bld: 96 mg/dL (ref 65–99)
Potassium: 4.5 mmol/L (ref 3.5–5.3)
SODIUM: 136 mmol/L (ref 135–146)
TOTAL PROTEIN: 6.2 g/dL (ref 6.1–8.1)
Total Bilirubin: 0.5 mg/dL (ref 0.2–1.2)

## 2017-09-20 LAB — TSH: TSH: 3.3 m[IU]/L (ref 0.40–4.50)

## 2017-09-23 ENCOUNTER — Encounter: Payer: Self-pay | Admitting: Internal Medicine

## 2017-09-23 ENCOUNTER — Ambulatory Visit (INDEPENDENT_AMBULATORY_CARE_PROVIDER_SITE_OTHER): Payer: Medicare Other | Admitting: Internal Medicine

## 2017-09-23 ENCOUNTER — Ambulatory Visit (INDEPENDENT_AMBULATORY_CARE_PROVIDER_SITE_OTHER): Payer: Medicare Other

## 2017-09-23 VITALS — BP 130/74 | HR 72 | Temp 98.2°F | Ht 68.0 in | Wt 181.0 lb

## 2017-09-23 DIAGNOSIS — G4700 Insomnia, unspecified: Secondary | ICD-10-CM | POA: Diagnosis not present

## 2017-09-23 DIAGNOSIS — F39 Unspecified mood [affective] disorder: Secondary | ICD-10-CM

## 2017-09-23 DIAGNOSIS — Z0000 Encounter for general adult medical examination without abnormal findings: Secondary | ICD-10-CM | POA: Diagnosis not present

## 2017-09-23 DIAGNOSIS — Z853 Personal history of malignant neoplasm of breast: Secondary | ICD-10-CM | POA: Diagnosis not present

## 2017-09-23 DIAGNOSIS — Z23 Encounter for immunization: Secondary | ICD-10-CM | POA: Diagnosis not present

## 2017-09-23 DIAGNOSIS — E782 Mixed hyperlipidemia: Secondary | ICD-10-CM

## 2017-09-23 DIAGNOSIS — Z79899 Other long term (current) drug therapy: Secondary | ICD-10-CM | POA: Diagnosis not present

## 2017-09-23 MED ORDER — VENLAFAXINE HCL 37.5 MG PO TABS: 37.5000 mg | ORAL_TABLET | Freq: Two times a day (BID) | ORAL | 3 refills | Status: DC

## 2017-09-23 MED ORDER — TETANUS-DIPHTH-ACELL PERTUSSIS 5-2.5-18.5 LF-MCG/0.5 IM SUSP: 0.5000 mL | Freq: Once | INTRAMUSCULAR | 0 refills | Status: AC

## 2017-09-23 NOTE — Patient Instructions (Signed)
Ms. Theresa Pena , Thank you for taking time to come for your Medicare Wellness Visit. I appreciate your ongoing commitment to your health goals. Please review the following plan we discussed and let me know if I can assist you in the future.   Screening recommendations/referrals: Colonoscopy up to date. Due 01/29/27 Mammogram up to date. Due 10/11/2018 Bone Density up to date Recommended yearly ophthalmology/optometry visit for glaucoma screening and checkup Recommended yearly dental visit for hygiene and checkup  Vaccinations: Influenza vaccine given today. Due 2019 fall season Pneumococcal vaccine 23 given today. Up to date Tdap vaccine due, prescription sent to pharmacy Shingles vaccine due, let Theresa Pena know if you decide to get this    Advanced directives: Please bring Theresa Pena a copy of your living will and health care power of attorney  Conditions/risks identified: none  Next appointment: [name], RN 09/29/2018 @ 10am   Preventive Care 40 Years and Older, Female Preventive care refers to lifestyle choices and visits with your health care provider that can promote health and wellness. What does preventive care include?  A yearly physical exam. This is also called an annual well check.  Dental exams once or twice a year.  Routine eye exams. Ask your health care provider how often you should have your eyes checked.  Personal lifestyle choices, including:  Daily care of your teeth and gums.  Regular physical activity.  Eating a healthy diet.  Avoiding tobacco and drug use.  Limiting alcohol use.  Practicing safe sex.  Taking low-dose aspirin every day.  Taking vitamin and mineral supplements as recommended by your health care provider. What happens during an annual well check? The services and screenings done by your health care provider during your annual well check will depend on your age, overall health, lifestyle risk factors, and family history of disease. Counseling    Your health care provider may ask you questions about your:  Alcohol use.  Tobacco use.  Drug use.  Emotional well-being.  Home and relationship well-being.  Sexual activity.  Eating habits.  History of falls.  Memory and ability to understand (cognition).  Work and work Statistician.  Reproductive health. Screening  You may have the following tests or measurements:  Height, weight, and BMI.  Blood pressure.  Lipid and cholesterol levels. These may be checked every 5 years, or more frequently if you are over 74 years old.  Skin check.  Lung cancer screening. You may have this screening every year starting at age 60 if you have a 30-pack-year history of smoking and currently smoke or have quit within the past 15 years.  Fecal occult blood test (FOBT) of the stool. You may have this test every year starting at age 42.  Flexible sigmoidoscopy or colonoscopy. You may have a sigmoidoscopy every 5 years or a colonoscopy every 10 years starting at age 33.  Hepatitis C blood test.  Hepatitis B blood test.  Sexually transmitted disease (STD) testing.  Diabetes screening. This is done by checking your blood sugar (glucose) after you have not eaten for a while (fasting). You may have this done every 1-3 years.  Bone density scan. This is done to screen for osteoporosis. You may have this done starting at age 36.  Mammogram. This may be done every 1-2 years. Talk to your health care provider about how often you should have regular mammograms. Talk with your health care provider about your test results, treatment options, and if necessary, the need for more tests. Vaccines  Your health care provider may recommend certain vaccines, such as:  Influenza vaccine. This is recommended every year.  Tetanus, diphtheria, and acellular pertussis (Tdap, Td) vaccine. You may need a Td booster every 10 years.  Zoster vaccine. You may need this after age 17.  Pneumococcal 13-valent  conjugate (PCV13) vaccine. One dose is recommended after age 14.  Pneumococcal polysaccharide (PPSV23) vaccine. One dose is recommended after age 55. Talk to your health care provider about which screenings and vaccines you need and how often you need them. This information is not intended to replace advice given to you by your health care provider. Make sure you discuss any questions you have with your health care provider. Document Released: 10/17/2015 Document Revised: 06/09/2016 Document Reviewed: 07/22/2015 Elsevier Interactive Patient Education  2017 [place] Prevention in the Home Falls can cause injuries. They can happen to people of all ages. There are many things you can do to make your home safe and to help prevent falls. What can I do on the outside of my home?  Regularly fix the edges of walkways and driveways and fix any cracks.  Remove anything that might make you trip as you walk through a door, such as a raised step or threshold.  Trim any bushes or trees on the path to your home.  Use bright outdoor lighting.  Clear any walking paths of anything that might make someone trip, such as rocks or tools.  Regularly check to see if handrails are loose or broken. Make sure that both sides of any steps have handrails.  Any raised decks and porches should have guardrails on the edges.  Have any leaves, snow, or ice cleared regularly.  Use sand or salt on walking paths during winter.  Clean up any spills in your garage right away. This includes oil or grease spills. What can I do in the bathroom?  Use night lights.  Install grab bars by the toilet and in the tub and shower. Do not use towel bars as grab bars.  Use non-skid mats or decals in the tub or shower.  If you need to sit down in the shower, use a plastic, non-slip stool.  Keep the floor dry. Clean up any water that spills on the floor as soon as it happens.  Remove soap buildup in the tub or  shower regularly.  Attach bath mats securely with double-sided non-slip rug tape.  Do not have throw rugs and other things on the floor that can make you trip. What can I do in the bedroom?  Use night lights.  Make sure that you have a light by your bed that is easy to reach.  Do not use any sheets or blankets that are too big for your bed. They should not hang down onto the floor.  Have a firm chair that has side arms. You can use this for support while you get dressed.  Do not have throw rugs and other things on the floor that can make you trip. What can I do in the kitchen?  Clean up any spills right away.  Avoid walking on wet floors.  Keep items that you use a lot in easy-to-reach places.  If you need to reach something above you, use a strong step stool that has a grab bar.  Keep electrical cords out of the way.  Do not use floor polish or wax that makes floors slippery. If you must use wax, use non-skid floor wax.  Do  not have throw rugs and other things on the floor that can make you trip. What can I do with my stairs?  Do not leave any items on the stairs.  Make sure that there are handrails on both sides of the stairs and use them. Fix handrails that are broken or loose. Make sure that handrails are as long as the stairways.  Check any carpeting to make sure that it is firmly attached to the stairs. Fix any carpet that is loose or worn.  Avoid having throw rugs at the top or bottom of the stairs. If you do have throw rugs, attach them to the floor with carpet tape.  Make sure that you have a light switch at the top of the stairs and the bottom of the stairs. If you do not have them, ask someone to add them for you. What else can I do to help prevent falls?  Wear shoes that:  Do not have high heels.  Have rubber bottoms.  Are comfortable and fit you well.  Are closed at the toe. Do not wear sandals.  If you use a stepladder:  Make sure that it is fully  opened. Do not climb a closed stepladder.  Make sure that both sides of the stepladder are locked into place.  Ask someone to hold it for you, if possible.  Clearly mark and make sure that you can see:  Any grab bars or handrails.  First and last steps.  Where the edge of each step is.  Use tools that help you move around (mobility aids) if they are needed. These include:  Canes.  Walkers.  Scooters.  Crutches.  Turn on the lights when you go into a dark area. Replace any light bulbs as soon as they burn out.  Set up your furniture so you have a clear path. Avoid moving your furniture around.  If any of your floors are uneven, fix them.  If there are any pets around you, be aware of where they are.  Review your medicines with your doctor. Some medicines can make you feel dizzy. This can increase your chance of falling. Ask your doctor what other things that you can do to help prevent falls. This information is not intended to replace advice given to you by your health care provider. Make sure you discuss any questions you have with your health care provider. Document Released: 07/17/2009 Document Revised: 02/26/2016 Document Reviewed: 10/25/2014 Elsevier Interactive Patient Education  2017 [employer].

## 2017-09-23 NOTE — Progress Notes (Signed)
Subjective:   Theresa Pena is a 66 y.o. female who presents for an Initial Medicare Annual Wellness Visit.        Objective:    Today's Vitals   09/23/17 0933  BP: 130/74  Pulse: 72  Temp: 98.2 F (36.8 C)  TempSrc: Oral  SpO2: 97%  Weight: 181 lb (82.1 kg)  Height: 5\' 8"  (1.727 m)   Body mass index is 27.52 kg/m.  Advanced Directives 09/23/2017 01/28/2017 06/18/2016 01/30/2016  Does Patient Have a Medical Advance Directive? Yes Yes Yes Yes  Type of Paramedic of [place];Living will [place];Living will [place];Living will [place];Living will  Does patient want to make changes to medical advance directive? No - Patient declined - No - Patient declined No - Patient declined  Copy of [place] in Chart? No - copy requested No - copy requested No - copy requested No - copy requested    Current Medications (verified) Outpatient Encounter Medications as of 09/23/2017  Medication Sig  . cholecalciferol (VITAMIN D) 1000 UNITS tablet Take 1,000 Units by mouth daily. Patient unsure of dosage  . Cyanocobalamin (B-12 PO) Take 1 tablet by mouth daily at 3 pm.  . Cyanocobalamin (VITAMIN B 12 PO) Take 1 tablet by mouth.  . fish oil-omega-3 fatty acids 1000 MG capsule Take 1 g by mouth daily. Not sure why she takes this. Not working for fatigue  . LORazepam (ATIVAN) 1 MG tablet Take 1 tablet by mouth 3 times a day  . Multiple Vitamin (MULTIVITAMIN) tablet Take 1 tablet by mouth daily.  Marland Kitchen RA CALCIUM HI-CAL PO Take by mouth. Take 3 tablets 1 time daily  . ranitidine (ZANTAC) 150 MG tablet TAKE 1 TABLET BY MOUTH EVERY MORNING AND 1/2 TABLET EVERY NIGHT AT BEDTIME  . simvastatin (ZOCOR) 10 MG tablet TAKE 1 TABLET BY MOUTH ONCE DAILY FOR CHOLESTEROL  . Tdap (BOOSTRIX) 5-2.5-18.5 LF-MCG/0.5 injection Inject 0.5 mLs into the muscle once.  . venlafaxine (EFFEXOR) 37.5 MG tablet TAKE 1  TABLET BY MOUTH TWICE DAILY  . zolpidem (AMBIEN) 10 MG tablet Take 1 tablet (10 mg total) by mouth at bedtime as needed for sleep.   Facility-Administered Encounter Medications as of 09/23/2017  Medication  . 0.9 %  sodium chloride infusion    Allergies (verified) Codeine and Omeprazole   History: Past Medical History:  Diagnosis Date  . Cancer ([place]) 2001   breast   . Cataracts, bilateral   . GERD (gastroesophageal reflux disease)   . Hyperlipidemia    Past Surgical History:  Procedure Laterality Date  . BREAST SURGERY    . RADIAL KERATOTOMY     Family History  Problem Relation Age of Onset  . Hyperlipidemia Mother   . Esophageal cancer Cousin 28  . Colon cancer Neg Hx   . Stomach cancer Neg Hx    Social History   Socioeconomic History  . Marital status: Divorced    Spouse name: None  . Number of children: None  . Years of education: None  . Highest education level: None  Social Needs  . Financial resource strain: Not hard at all  . Food insecurity - worry: Never true  . Food insecurity - inability: Never true  . Transportation needs - medical: No  . Transportation needs - non-medical: No  Occupational History  . None  Tobacco Use  . Smoking status: Former Smoker    Last attempt to quit: 10/04/1990  Years since quitting: 26.9  . Smokeless tobacco: Never Used  Substance and Sexual Activity  . Alcohol use: Yes    Alcohol/week: 7.8 - 9.0 oz    Types: 8 Glasses of wine, 5 - 7 Standard drinks or equivalent per week  . Drug use: No  . Sexual activity: None  Other Topics Concern  . None  Social History Narrative   Diet:      Do you drink/ eat things with caffeine? Coffee 2-3 cups /day      Marital status:  Divorced                             What year were you married ? 2004      Do you live in a house, apartment,assistred living, condo, trailer, etc.)? House      Is it one or more stories? 1      How many persons live in your home ? 1      Do you  have any pets in your home ?(please list)  1 dog      Current or past profession: Residential cleaning      Do you exercise?                              Type & how often: Rarely      Do you have a living will? Yes      Do you have a DNR form?   No                    If not, do you want to discuss one? Yes      Do you have signed POA?HPOA forms?   Yes              If so, please bring to your        appointment       Tobacco Counseling Counseling given: Not Answered   Clinical Intake:  Pre-visit preparation completed: No  Pain : No/denies pain     Nutritional Status: BMI 25 -29 Overweight Nutritional Risks: None Diabetes: No  How often do you need to have someone help you when you read instructions, pamphlets, or other written materials from your doctor or pharmacy?: 1 - Never What is the last grade level you completed in school?: [employer]  Interpreter Needed?: No  Information entered by :: [name], RN   Activities of Daily Living In your present state of health, do you have any difficulty performing the following activities: 09/23/2017  Hearing? N  Vision? N  Difficulty concentrating or making decisions? Y  Walking or climbing stairs? N  Dressing or bathing? N  Doing errands, shopping? N  Preparing Food and eating ? N  Using the Toilet? N  In the past six months, have you accidently leaked urine? Y  Do you have problems with loss of bowel control? N  Managing your Medications? N  Managing your Finances? N  Housekeeping or managing your Housekeeping? N  Some recent data might be hidden     Immunizations and Health Maintenance Immunization History  Administered Date(s) Administered  . Influenza,inj,Quad PF,6+ Mos 06/18/2016  . Pneumococcal Conjugate-13 06/18/2016  . Zoster 06/05/2015   Health Maintenance Due  Topic Date Due  . TETANUS/TDAP  06/12/1969  . INFLUENZA VACCINE  05/04/2017  . PNA vac Low Risk Adult (2 of 2 -  PPSV23) 06/18/2017     Patient Care Team: Gildardo Cranker, DO as PCP - General (Internal Medicine) Webb Laws, [place] as Referring Physician (Optometry) Druscilla Brownie, MD as Consulting Physician (Dermatology)  Indicate any recent Medical Services you may have received from other than [facility] providers in the past year (date may be approximate).     Assessment:   This is a routine wellness examination for [place].  Hearing/Vision screen Hearing Screening Comments: Pt reports a little decline with hearing- no need for hearing screen Vision Screening Comments: Sees Dr. Lynelle Doctor annually  Dietary issues and exercise activities discussed: Current Exercise Habits: The patient has a physically strenous job, but has no regular exercise apart from work., Exercise limited by: None identified  Goals    . Weight (lb) < 170 lb (77.1 kg)     Pt would like to get her weight to 170 lbs      Depression Screen PHQ 2/9 Scores 09/23/2017 06/18/2016  PHQ - 2 Score 1 2    Fall Risk Fall Risk  09/23/2017 06/18/2016 01/30/2016 03/14/2015  Falls in the past year? No Yes No No  Number falls in past yr: - 1 - -  Injury with Fall? - Yes - -    Is the patient's home free of loose throw rugs in walkways, pet beds, electrical cords, etc?   yes      Grab bars in the bathroom? yes      Handrails on the stairs?   yes      Adequate lighting?   yes  Timed Get Up and Go Performed 18 seconds, fall risk  Cognitive Function: MMSE - Mini Mental State Exam 09/23/2017 06/18/2016  Orientation to time 5 5  Orientation to Place 5 5  Registration 3 3  Attention/ Calculation 5 5  Recall 2 2  Language- name 2 objects 2 2  Language- repeat 1 1  Language- follow 3 step command 3 3  Language- read & follow direction 1 1  Write a sentence 1 1  Copy design 1 1  Total score 29 29        Screening Tests Health Maintenance  Topic Date Due  . TETANUS/TDAP  06/12/1969  . INFLUENZA VACCINE  05/04/2017  . PNA vac Low Risk Adult (2  of 2 - PPSV23) 06/18/2017  . MAMMOGRAM  10/07/2018  . COLONOSCOPY  01/28/2022  . DEXA SCAN  Completed  . Hepatitis C Screening  Completed    Qualifies for Shingles Vaccine? Yes, educated and will get later in 2019  Cancer Screenings: Lung: Low Dose CT Chest recommended if Age 13-80 years, 30 pack-year currently smoking OR have quit w/in 15years. Patient does not qualify. Breast: Up to date on Mammogram? Yes   Up to date of Bone Density/Dexa? Yes Colorectal: up to date  Additional Screenings:  Hepatitis B/HIV/Syphillis: Not indicated Hepatitis C Screening: Pt has had tested previously and was negative     Plan:    I have personally reviewed and addressed the Medicare Annual Wellness questionnaire and have noted the following in the patient's chart:  A. Medical and social history B. Use of alcohol, tobacco or illicit drugs  C. Current medications and supplements D. Functional ability and status E.  Nutritional status F.  Physical activity G. Advance directives H. List of other physicians I.  Hospitalizations, surgeries, and ER visits in previous 12 months J.  [place] to include hearing, vision, cognitive, depression L. Referrals and appointments - none  In addition,  I have reviewed and discussed with patient certain preventive protocols, quality metrics, and best practice recommendations. A written personalized care plan for preventive services as well as general preventive health recommendations were provided to patient.  See attached scanned questionnaire for additional information.   Signed,   [name], RN Nurse Health Advisor   Quick Notes   Health Maintenance: PNA 23 and flu vaccine given today. TDAP prescription sent to pharmacy. Pt will get Shingles vaccine later in 2019     Abnormal Screen: MMSE 29/30. Did not pass clock drawing     Patient Concerns: Would like zocor reviewed by doctor     Nurse Concerns: none

## 2017-09-23 NOTE — Patient Instructions (Addendum)
STOP EFFEXOR XR  START GENERIC EFFEXOR 37.5 mg 2 times daily   Continue other medications as ordered  Follow up with mammogram as scheduled  Follow up in 1 yr for CPE/EV. Fasting labs prior to appt  Keeping You Healthy  Get These Tests  Blood Pressure- Have your blood pressure checked by your healthcare provider at least once a year.  Normal blood pressure is 120/80.  Weight- Have your body mass index (BMI) calculated to screen for obesity.  BMI is a measure of body fat based on height and weight.  You can calculate your own BMI at [employer]  Cholesterol- Have your cholesterol checked every year.  Diabetes- Have your blood sugar checked every year if you have high blood pressure, high cholesterol, a family history of diabetes or if you are overweight.  Pap Test - Have a pap test every 1 to 5 years if you have been sexually active.  If you are older than 65 and recent pap tests have been normal you may not need additional pap tests.  In addition, if you have had a hysterectomy  for benign disease additional pap tests are not necessary.  Mammogram-Yearly mammograms are essential for early detection of breast cancer  Screening for Colon Cancer- Colonoscopy starting at age 50. Screening may begin sooner depending on your family history and other health conditions.  Follow up colonoscopy as directed by your Gastroenterologist.  Screening for Osteoporosis- Screening begins at age 79 with bone density scanning, sooner if you are at higher risk for developing Osteoporosis.  Get these medicines  Calcium with Vitamin D- Your body requires 1200-1500 mg of Calcium a day and [phone] IU of Vitamin D a day.  You can only absorb 500 mg of Calcium at a time therefore Calcium must be taken in 2 or 3 separate doses throughout the day.  Hormones- Hormone therapy has been associated with increased risk for certain cancers and heart disease.  Talk to your healthcare provider about if you  need relief from menopausal symptoms.  Aspirin- Ask your healthcare provider about taking Aspirin to prevent Heart Disease and Stroke.  Get these Immuniztions  Flu shot- Every fall  Pneumonia shot- Once after the age of 64; if you are younger ask your healthcare provider if you need a pneumonia shot.  Tetanus- Every ten years.  Zostavax- Once after the age of 44 to prevent shingles.  Take these steps  Don't smoke- Your healthcare provider can help you quit. For tips on how to quit, ask your healthcare provider or go to www.smokefree.gov or call 1-800 QUIT-NOW.  Be physically active- Exercise 5 days a week for a minimum of 30 minutes.  If you are not already physically active, start slow and gradually work up to 30 minutes of moderate physical activity.  Try walking, dancing, bike riding, swimming, etc.  Eat a healthy diet- Eat a variety of healthy foods such as fruits, vegetables, whole grains, low fat milk, low fat cheeses, yogurt, lean meats, chicken, fish, eggs, dried beans, tofu, etc.  For more information go to www.thenutritionsource.org  Dental visit- Brush and floss teeth twice daily; visit your dentist twice a year.  Eye exam- Visit your Optometrist or Ophthalmologist yearly.  Drink alcohol in moderation- Limit alcohol intake to one drink or less a day.  Never drink and drive.  Depression- Your emotional health is as important as your physical health.  If you're feeling down or losing interest in things you normally enjoy, please talk to your  healthcare provider.  Seat Belts- can save your life; always wear one  Smoke/Carbon Monoxide detectors- These detectors need to be installed on the appropriate level of your home.  Replace batteries at least once a year.  Violence- If anyone is threatening or hurting you, please tell your healthcare provider.  Living Will/ Health care power of attorney- Discuss with your healthcare provider and family.

## 2017-09-23 NOTE — Progress Notes (Signed)
Patient ID: Theresa Pena, female   DOB: 1951/09/26, 66 y.o.   MRN: [id number]   Location:  [facility]  Place of Service:  OFFICE  Provider: Arletha Grippe, DO  Patient Care Team: Gildardo Cranker, DO as PCP - General (Internal Medicine) Webb Laws, [place] as Referring Physician (Optometry) Druscilla Brownie, MD as Consulting Physician (Dermatology)  Extended Emergency Contact Information Primary Emergency Contact: Dishman,Carolyn Address: [address]          [place], [place] [address] Johnnette Litter of [place] Phone: [phone] Work Phone: [phone] Mobile Phone: [phone] Relation: Sister  Code Status: FULL CODE Goals of Care: Advanced Directive information Advanced Directives 09/23/2017  Does Patient Have a Medical Advance Directive? Yes  Type of Paramedic of [place];Living will  Does patient want to make changes to medical advance directive? No - Patient declined  Copy of [place] in Chart? No - copy requested     Chief Complaint  Patient presents with  . Annual Exam    Yearly Exam  . MMSE    29/30 Did Not Pass Clock Drawing    HPI: Patient is a 66 y.o. female seen in today for a comprehensive exam.  Reviewed AWV note from today. Lab results reviewed with pt  Hiatal hernia/hx duodenal ulcer/IBS - colonoscopy done in Apr 2018 revealed x2 tubular adenomas; EGD in Apr 2018 that showed hiatal hernia. She takes zantac BID. She has breakthrough heartburn. Omeprazole caused diarrhea.   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 Followed by dermatology. [place] excised from left leg in early Dec 2018 and she will f/u in Jan for further tx  hx right breast cancer - in 2001 s/p lumpectomy x 2 and XRT. No chemotx req'd. She completed 5 yrs of tamoxifen tx. Her GYN is at [facility]. 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 Mammogram scheduled for early  2019.  Anxiety/mood d/o- she reports breakthrough nervous stomach sensation, breathing heavy and tremors. Her effexor dose was changed to XR 75mg  daily instead of 37.5 BID by GYN last month. She was tolerated BID dosing without issues. Rarely needs lorazepam.  Insomnia - stable on prn ambien  Hyperlipidemia - mixed; stable on simvastatin. LDL 116 (prev 121).   Depression screen [facility] 2/9 09/23/2017 09/23/2017 06/18/2016  Decreased Interest 0 0 1  Down, Depressed, Hopeless 1 1 1   PHQ - 2 Score 1 1 2     Fall Risk  09/23/2017 09/23/2017 06/18/2016 01/30/2016 03/14/2015  Falls in the past year? No No Yes No No  Number falls in past yr: - - 1 - -  Injury with Fall? - - Yes - -   MMSE - Mini Mental State Exam 09/23/2017 06/18/2016  Orientation to time 5 5  Orientation to Place 5 5  Registration 3 3  Attention/ Calculation 5 5  Recall 2 2  Language- name 2 objects 2 2  Language- repeat 1 1  Language- follow 3 step command 3 3  Language- read & follow direction 1 1  Write a sentence 1 1  Copy design 1 1  Total score 29 29     Health Maintenance  Topic Date Due  . TETANUS/TDAP  06/12/1969  . MAMMOGRAM  10/07/2018  . COLONOSCOPY  01/28/2022  . INFLUENZA VACCINE  Completed  . DEXA SCAN  Completed  . Hepatitis C Screening  Completed  . PNA vac Low Risk Adult  Completed    Past Medical History:  Diagnosis Date  . Cancer ([place]) 2001   breast   . Cataracts, bilateral   . GERD (gastroesophageal reflux disease)   . Hyperlipidemia     Past Surgical History:  Procedure Laterality Date  . BREAST SURGERY    . RADIAL KERATOTOMY      Family History  Problem Relation Age of Onset  . Hyperlipidemia Mother   . Esophageal cancer Cousin 77  . Colon cancer Neg Hx   . Stomach cancer Neg Hx    Family Status  Relation Name Status  . Mother  Alive  . Father  Deceased  . Sister  Alive  . MGM  Deceased  . MGF  Deceased  . PGM  Deceased  . PGF  Deceased  . Cousin  (Not Specified)   . Neg Hx  (Not Specified)    Social History   Socioeconomic History  . Marital status: Divorced    Spouse name: Not on file  . Number of children: Not on file  . Years of education: Not on file  . Highest education level: Not on file  Social Needs  . Financial resource strain: Not hard at all  . Food insecurity - worry: Never true  . Food insecurity - inability: Never true  . Transportation needs - medical: No  . Transportation needs - non-medical: No  Occupational History  . Not on file  Tobacco Use  . Smoking status: Former Smoker    Last attempt to quit: 10/04/1990    Years since quitting: 26.9  . Smokeless tobacco: Never Used  Substance and Sexual Activity  . Alcohol use: Yes    Alcohol/week: 7.8 - 9.0 oz    Types: 8 Glasses of wine, 5 - 7 Standard drinks or equivalent per week  . Drug use: No  . Sexual activity: Not on file  Other Topics Concern  . Not on file  Social History Narrative   Diet:      Do you drink/ eat things with caffeine? Coffee 2-3 cups /day      Marital status:  Divorced                             What year were you married ? 2004      Do you live in a house, apartment,assistred living, condo, trailer, etc.)? House      Is it one or more stories? 1      How many persons live in your home ? 1      Do you have any pets in your home ?(please list)  1 dog      Current or past profession: Residential cleaning      Do you exercise?                              Type & how often: Rarely      Do you have a living will? Yes      Do you have a DNR form?   No                    If not, do you want to discuss one? Yes      Do you have signed POA?HPOA forms?   Yes              If so, please bring to your  appointment       Allergies  Allergen Reactions  . Codeine Nausea Only    Also has dizziness  . Omeprazole Other (See Comments)    GI upset     Allergies as of 09/23/2017      Reactions   Codeine Nausea Only   Also has dizziness    Omeprazole Other (See Comments)   GI upset       Medication List        Accurate as of 09/23/17 10:15 AM. Always use your most recent med list.          B-12 PO Take 1 tablet by mouth daily at 3 pm.   cholecalciferol 1000 units tablet Commonly known as:  VITAMIN D Take 1,000 Units by mouth daily. Patient unsure of dosage   fish oil-omega-3 fatty acids 1000 MG capsule Take 1 g by mouth daily. Not sure why she takes this. Not working for fatigue   LORazepam 1 MG tablet Commonly known as:  ATIVAN Take 1 tablet by mouth 3 times a day   multivitamin tablet Take 1 tablet by mouth daily.   RA CALCIUM HI-CAL PO Take by mouth. Take 3 tablets 1 time daily   ranitidine 150 MG tablet Commonly known as:  ZANTAC TAKE 1 TABLET BY MOUTH EVERY MORNING AND 1/2 TABLET EVERY NIGHT AT BEDTIME   simvastatin 10 MG tablet Commonly known as:  ZOCOR TAKE 1 TABLET BY MOUTH ONCE DAILY FOR CHOLESTEROL   Tdap 5-2.5-18.5 LF-MCG/0.5 injection Commonly known as:  BOOSTRIX Inject 0.5 mLs into the muscle once for 1 dose.   venlafaxine 37.5 MG tablet Commonly known as:  EFFEXOR TAKE 1 TABLET BY MOUTH TWICE DAILY   VITAMIN B 12 PO Take 1 tablet by mouth.   zolpidem 10 MG tablet Commonly known as:  AMBIEN Take 1 tablet (10 mg total) by mouth at bedtime as needed for sleep.        Review of Systems:  Review of Systems  Gastrointestinal: Positive for abdominal distention.  Psychiatric/Behavioral: Positive for sleep disturbance. The patient is nervous/anxious.   All other systems reviewed and are negative.   Physical Exam: Vitals:   09/23/17 0951  BP: 130/74  Pulse: 72  Temp: 98.2 F (36.8 C)  TempSrc: Oral  SpO2: 97%  Weight: 181 lb (82.1 kg)  Height: 5\' 8"  (1.727 m)   Body mass index is 27.52 kg/m. Physical Exam  Constitutional: She is oriented to person, place, and time. She appears well-developed and well-nourished. No distress.  HENT:  Head: Normocephalic and  atraumatic.  Right Ear: External ear normal.  Left Ear: External ear normal.  Mouth/Throat: Oropharynx is clear and moist. No oropharyngeal exudate.  MMM; no oral thrush  Eyes: EOM are normal. Pupils are equal, round, and reactive to light. No scleral icterus.  Neck: Normal range of motion. Neck supple. Carotid bruit is not present. No tracheal deviation present. No thyromegaly present.  Cardiovascular: Normal rate, regular rhythm and intact distal pulses. Exam reveals no gallop and no friction rub.  No murmur heard. No LE edema b/l. No calf TTP  Pulmonary/Chest: Effort normal and breath sounds normal. No respiratory distress. She has no wheezes. She has no rales. She exhibits no tenderness. Right breast exhibits skin change (right scar). Right breast exhibits no inverted nipple, no mass, no nipple discharge and no tenderness. Left breast exhibits skin change (left scar tissue palpable). Left breast exhibits no inverted nipple, no mass, no nipple discharge and no tenderness.  Breasts are asymmetrical.  No rhonchi  Abdominal: Soft. Bowel sounds are normal. She exhibits distension. She exhibits no mass. There is no hepatosplenomegaly or hepatomegaly. There is no tenderness. There is no rebound and no guarding. No hernia.  Genitourinary:  Genitourinary Comments: Deferred to GYN  Musculoskeletal: She exhibits edema (right ankle). She exhibits no deformity.  Lymphadenopathy:    She has no cervical adenopathy.  Neurological: She is alert and oriented to person, place, and time. She has normal reflexes.  Skin: Skin is warm and dry. No rash noted.  Right ACW tattoo  Psychiatric: She has a normal mood and affect. Her behavior is normal. Judgment and thought content normal.  Vitals reviewed.   Labs reviewed:  Basic Metabolic Panel: Recent Labs    09/20/17 0828  NA 136  K 4.5  CL 100  CO2 30  GLUCOSE 96  BUN 15  CREATININE 0.69  CALCIUM 9.0  TSH 3.30   Liver Function Tests: Recent Labs      09/20/17 0828  AST 17  ALT 14  BILITOT 0.5  PROT 6.2   No results for input(s): LIPASE, AMYLASE in the last 8760 hours. No results for input(s): AMMONIA in the last 8760 hours. CBC: Recent Labs    09/20/17 0828  WBC 5.2  NEUTROABS 3,078  HGB 12.7  HCT 37.7  MCV 92.9  PLT 231   Lipid Panel: Recent Labs    09/20/17 0828  CHOL 204*  HDL 64  TRIG 128  CHOLHDL 3.2   No results found for: HGBA1C  Procedures: No results found. ECG OBTAINED AND REVIEWED BY MYSELF - NSR @ 60 bpm, nml axis, PACs, LAE, RBBB. No acute ischemic changes. No other ECG available to compare.  Assessment/Plan   ICD-10-CM   1. Well adult exam Z00.00   2. Mixed hyperlipidemia E78.2   3. High risk medication use Z79.899   4. Mood disorder (HCC) F39 venlafaxine (EFFEXOR) 37.5 MG tablet  5. Insomnia, unspecified type G47.00   6. History of breast cancer in female Z85.3    STOP EFFEXOR XR  START GENERIC EFFEXOR 37.5 mg 2 times daily   Continue other medications as ordered  Follow up with mammogram as scheduled  Follow up in 1 yr for CPE/EV. Fasting labs prior to appt (cbc w diff, cmp, lipid, tsh)  Keeping You Healthy handout given  Cordella Register. Perlie Gold  [facility] [address] [place], [place] [address] [phone] Cell (Monday-Friday 8 AM - 5 PM) [phone] After 5 PM and follow prompts

## 2017-10-20 DIAGNOSIS — C44719 Basal cell carcinoma of skin of left lower limb, including hip: Secondary | ICD-10-CM | POA: Diagnosis not present

## 2017-10-20 DIAGNOSIS — L988 Other specified disorders of the skin and subcutaneous tissue: Secondary | ICD-10-CM | POA: Diagnosis not present

## 2017-11-04 DIAGNOSIS — Z1231 Encounter for screening mammogram for malignant neoplasm of breast: Secondary | ICD-10-CM | POA: Diagnosis not present

## 2017-11-04 DIAGNOSIS — Z853 Personal history of malignant neoplasm of breast: Secondary | ICD-10-CM | POA: Diagnosis not present

## 2017-11-04 LAB — HM MAMMOGRAPHY

## 2017-12-15 DIAGNOSIS — L905 Scar conditions and fibrosis of skin: Secondary | ICD-10-CM | POA: Diagnosis not present

## 2017-12-15 DIAGNOSIS — Z85828 Personal history of other malignant neoplasm of skin: Secondary | ICD-10-CM | POA: Diagnosis not present

## 2018-01-03 ENCOUNTER — Other Ambulatory Visit: Payer: Self-pay | Admitting: Internal Medicine

## 2018-02-23 ENCOUNTER — Other Ambulatory Visit: Payer: Self-pay | Admitting: Internal Medicine

## 2018-02-23 DIAGNOSIS — F39 Unspecified mood [affective] disorder: Secondary | ICD-10-CM

## 2018-04-25 ENCOUNTER — Encounter: Payer: Self-pay | Admitting: Internal Medicine

## 2018-05-24 ENCOUNTER — Encounter: Payer: Self-pay | Admitting: Internal Medicine

## 2018-05-31 ENCOUNTER — Other Ambulatory Visit: Payer: Self-pay

## 2018-05-31 DIAGNOSIS — Z79899 Other long term (current) drug therapy: Secondary | ICD-10-CM

## 2018-05-31 DIAGNOSIS — E782 Mixed hyperlipidemia: Secondary | ICD-10-CM

## 2018-06-29 DIAGNOSIS — D225 Melanocytic nevi of trunk: Secondary | ICD-10-CM | POA: Diagnosis not present

## 2018-06-29 DIAGNOSIS — Z85820 Personal history of malignant melanoma of skin: Secondary | ICD-10-CM | POA: Diagnosis not present

## 2018-06-29 DIAGNOSIS — D1801 Hemangioma of skin and subcutaneous tissue: Secondary | ICD-10-CM | POA: Diagnosis not present

## 2018-06-29 DIAGNOSIS — L821 Other seborrheic keratosis: Secondary | ICD-10-CM | POA: Diagnosis not present

## 2018-07-14 ENCOUNTER — Other Ambulatory Visit: Payer: Self-pay | Admitting: Internal Medicine

## 2018-09-25 ENCOUNTER — Other Ambulatory Visit: Payer: Medicare Other

## 2018-09-29 ENCOUNTER — Encounter: Payer: Medicare Other | Admitting: Internal Medicine

## 2018-09-29 ENCOUNTER — Ambulatory Visit (INDEPENDENT_AMBULATORY_CARE_PROVIDER_SITE_OTHER): Payer: Medicare Other

## 2018-09-29 VITALS — BP 142/82 | HR 65 | Temp 98.0°F | Ht 68.0 in | Wt 184.0 lb

## 2018-09-29 DIAGNOSIS — Z1239 Encounter for other screening for malignant neoplasm of breast: Secondary | ICD-10-CM

## 2018-09-29 DIAGNOSIS — E2839 Other primary ovarian failure: Secondary | ICD-10-CM

## 2018-09-29 DIAGNOSIS — E782 Mixed hyperlipidemia: Secondary | ICD-10-CM | POA: Diagnosis not present

## 2018-09-29 DIAGNOSIS — Z79899 Other long term (current) drug therapy: Secondary | ICD-10-CM

## 2018-09-29 DIAGNOSIS — Z0000 Encounter for general adult medical examination without abnormal findings: Secondary | ICD-10-CM

## 2018-09-29 LAB — CBC WITH DIFFERENTIAL/PLATELET
Absolute Monocytes: 449 cells/uL (ref 200–950)
BASOS ABS: 41 {cells}/uL (ref 0–200)
Basophils Relative: 0.8 %
EOS PCT: 1.4 %
Eosinophils Absolute: 71 cells/uL (ref 15–500)
HEMATOCRIT: 39.2 % (ref 35.0–45.0)
Hemoglobin: 13.2 g/dL (ref 11.7–15.5)
LYMPHS ABS: 1775 {cells}/uL (ref 850–3900)
MCH: 30.7 pg (ref 27.0–33.0)
MCHC: 33.7 g/dL (ref 32.0–36.0)
MCV: 91.2 fL (ref 80.0–100.0)
MPV: 12.2 fL (ref 7.5–12.5)
Monocytes Relative: 8.8 %
NEUTROS PCT: 54.2 %
Neutro Abs: 2764 cells/uL (ref 1500–7800)
Platelets: 234 10*3/uL (ref 140–400)
RBC: 4.3 10*6/uL (ref 3.80–5.10)
RDW: 12.8 % (ref 11.0–15.0)
Total Lymphocyte: 34.8 %
WBC: 5.1 10*3/uL (ref 3.8–10.8)

## 2018-09-29 LAB — LIPID PANEL
Cholesterol: 251 mg/dL — ABNORMAL HIGH (ref <200)
HDL: 74 mg/dL (ref >50)
LDL Cholesterol (Calc): 155 mg/dL (calc) — ABNORMAL HIGH
Non-HDL Cholesterol (Calc): 177 mg/dL (calc) — ABNORMAL HIGH (ref <130)
TRIGLYCERIDES: 108 mg/dL (ref <150)
Total CHOL/HDL Ratio: 3.4 (calc) (ref <5.0)

## 2018-09-29 LAB — COMPLETE METABOLIC PANEL WITH GFR
AG Ratio: 1.6 (calc) (ref 1.0–2.5)
ALT: 15 U/L (ref 6–29)
AST: 17 U/L (ref 10–35)
Albumin: 4.1 g/dL (ref 3.6–5.1)
Alkaline phosphatase (APISO): 52 U/L (ref 33–130)
BILIRUBIN TOTAL: 0.4 mg/dL (ref 0.2–1.2)
BUN: 12 mg/dL (ref 7–25)
CHLORIDE: 103 mmol/L (ref 98–110)
CO2: 27 mmol/L (ref 20–32)
Calcium: 9.4 mg/dL (ref 8.6–10.4)
Creat: 0.78 mg/dL (ref 0.50–0.99)
GFR, EST AFRICAN AMERICAN: 91 mL/min/{1.73_m2} (ref >=60)
GFR, Est Non African American: 78 mL/min/{1.73_m2} (ref >=60)
GLOBULIN: 2.6 g/dL (ref 1.9–3.7)
Glucose, Bld: 84 mg/dL (ref 65–99)
POTASSIUM: 4.4 mmol/L (ref 3.5–5.3)
Sodium: 137 mmol/L (ref 135–146)
TOTAL PROTEIN: 6.7 g/dL (ref 6.1–8.1)

## 2018-09-29 LAB — TSH: TSH: 2.46 mIU/L (ref 0.40–4.50)

## 2018-09-29 NOTE — Patient Instructions (Signed)
Ms. Theresa Pena , Thank you for taking time to come for your Medicare Wellness Visit. I appreciate your ongoing commitment to your health goals. Please review the following plan we discussed and let me know if I can assist you in the future.   Screening recommendations/referrals: Colonoscopy up to date, due 01/29/2027 Mammogram up to date, due 11/2018, ordered  Bone Density due, ordered  Recommended yearly ophthalmology/optometry visit for glaucoma screening and checkup Recommended yearly dental visit for hygiene and checkup  Vaccinations: Influenza vaccine up to date Pneumococcal vaccine up to date, completed Tdap vaccine due, ordered to pharmacy Shingles vaccine due, ordered to pharmacy    Advanced directives: In chart  Conditions/risks identified: none  Next appointment: [name], NP 10/20/2018 @ 1:30pm            Medicare Wellness Visit 10/18/2066 @ 10am   Preventive Care 68 Years and Older, Female Preventive care refers to lifestyle choices and visits with your health care provider that can promote health and wellness. What does preventive care include?  A yearly physical exam. This is also called an annual well check.  Dental exams once or twice a year.  Routine eye exams. Ask your health care provider how often you should have your eyes checked.  Personal lifestyle choices, including:  Daily care of your teeth and gums.  Regular physical activity.  Eating a healthy diet.  Avoiding tobacco and drug use.  Limiting alcohol use.  Practicing safe sex.  Taking low-dose aspirin every day.  Taking vitamin and mineral supplements as recommended by your health care provider. What happens during an annual well check? The services and screenings done by your health care provider during your annual well check will depend on your age, overall health, lifestyle risk factors, and family history of disease. Counseling  Your health care provider may ask you questions about  your:  Alcohol use.  Tobacco use.  Drug use.  Emotional well-being.  Home and relationship well-being.  Sexual activity.  Eating habits.  History of falls.  Memory and ability to understand (cognition).  Work and work Statistician.  Reproductive health. Screening  You may have the following tests or measurements:  Height, weight, and BMI.  Blood pressure.  Lipid and cholesterol levels. These may be checked every 5 years, or more frequently if you are over 90 years old.  Skin check.  Lung cancer screening. You may have this screening every year starting at age 84 if you have a 30-pack-year history of smoking and currently smoke or have quit within the past 15 years.  Fecal occult blood test (FOBT) of the stool. You may have this test every year starting at age 22.  Flexible sigmoidoscopy or colonoscopy. You may have a sigmoidoscopy every 5 years or a colonoscopy every 10 years starting at age 48.  Hepatitis C blood test.  Hepatitis B blood test.  Sexually transmitted disease (STD) testing.  Diabetes screening. This is done by checking your blood sugar (glucose) after you have not eaten for a while (fasting). You may have this done every 1-3 years.  Bone density scan. This is done to screen for osteoporosis. You may have this done starting at age 39.  Mammogram. This may be done every 1-2 years. Talk to your health care provider about how often you should have regular mammograms. Talk with your health care provider about your test results, treatment options, and if necessary, the need for more tests. Vaccines  Your health care provider may recommend certain  vaccines, such as:  Influenza vaccine. This is recommended every year.  Tetanus, diphtheria, and acellular pertussis (Tdap, Td) vaccine. You may need a Td booster every 10 years.  Zoster vaccine. You may need this after age 31.  Pneumococcal 13-valent conjugate (PCV13) vaccine. One dose is recommended  after age 42.  Pneumococcal polysaccharide (PPSV23) vaccine. One dose is recommended after age 57. Talk to your health care provider about which screenings and vaccines you need and how often you need them. This information is not intended to replace advice given to you by your health care provider. Make sure you discuss any questions you have with your health care provider. Document Released: 10/17/2015 Document Revised: 06/09/2016 Document Reviewed: 07/22/2015 Elsevier Interactive Patient Education  2017 [place] Prevention in the Home Falls can cause injuries. They can happen to people of all ages. There are many things you can do to make your home safe and to help prevent falls. What can I do on the outside of my home?  Regularly fix the edges of walkways and driveways and fix any cracks.  Remove anything that might make you trip as you walk through a door, such as a raised step or threshold.  Trim any bushes or trees on the path to your home.  Use bright outdoor lighting.  Clear any walking paths of anything that might make someone trip, such as rocks or tools.  Regularly check to see if handrails are loose or broken. Make sure that both sides of any steps have handrails.  Any raised decks and porches should have guardrails on the edges.  Have any leaves, snow, or ice cleared regularly.  Use sand or salt on walking paths during winter.  Clean up any spills in your garage right away. This includes oil or grease spills. What can I do in the bathroom?  Use night lights.  Install grab bars by the toilet and in the tub and shower. Do not use towel bars as grab bars.  Use non-skid mats or decals in the tub or shower.  If you need to sit down in the shower, use a plastic, non-slip stool.  Keep the floor dry. Clean up any water that spills on the floor as soon as it happens.  Remove soap buildup in the tub or shower regularly.  Attach bath mats securely with  double-sided non-slip rug tape.  Do not have throw rugs and other things on the floor that can make you trip. What can I do in the bedroom?  Use night lights.  Make sure that you have a light by your bed that is easy to reach.  Do not use any sheets or blankets that are too big for your bed. They should not hang down onto the floor.  Have a firm chair that has side arms. You can use this for support while you get dressed.  Do not have throw rugs and other things on the floor that can make you trip. What can I do in the kitchen?  Clean up any spills right away.  Avoid walking on wet floors.  Keep items that you use a lot in easy-to-reach places.  If you need to reach something above you, use a strong step stool that has a grab bar.  Keep electrical cords out of the way.  Do not use floor polish or wax that makes floors slippery. If you must use wax, use non-skid floor wax.  Do not have throw rugs and other things  on the floor that can make you trip. What can I do with my stairs?  Do not leave any items on the stairs.  Make sure that there are handrails on both sides of the stairs and use them. Fix handrails that are broken or loose. Make sure that handrails are as long as the stairways.  Check any carpeting to make sure that it is firmly attached to the stairs. Fix any carpet that is loose or worn.  Avoid having throw rugs at the top or bottom of the stairs. If you do have throw rugs, attach them to the floor with carpet tape.  Make sure that you have a light switch at the top of the stairs and the bottom of the stairs. If you do not have them, ask someone to add them for you. What else can I do to help prevent falls?  Wear shoes that:  Do not have high heels.  Have rubber bottoms.  Are comfortable and fit you well.  Are closed at the toe. Do not wear sandals.  If you use a stepladder:  Make sure that it is fully opened. Do not climb a closed stepladder.  Make  sure that both sides of the stepladder are locked into place.  Ask someone to hold it for you, if possible.  Clearly mark and make sure that you can see:  Any grab bars or handrails.  First and last steps.  Where the edge of each step is.  Use tools that help you move around (mobility aids) if they are needed. These include:  Canes.  Walkers.  Scooters.  Crutches.  Turn on the lights when you go into a dark area. Replace any light bulbs as soon as they burn out.  Set up your furniture so you have a clear path. Avoid moving your furniture around.  If any of your floors are uneven, fix them.  If there are any pets around you, be aware of where they are.  Review your medicines with your doctor. Some medicines can make you feel dizzy. This can increase your chance of falling. Ask your doctor what other things that you can do to help prevent falls. This information is not intended to replace advice given to you by your health care provider. Make sure you discuss any questions you have with your health care provider. Document Released: 07/17/2009 Document Revised: 02/26/2016 Document Reviewed: 10/25/2014 Elsevier Interactive Patient Education  2017 [employer].

## 2018-09-29 NOTE — Progress Notes (Addendum)
Subjective:   Brieanne Mignone is a 67 y.o. female who presents for Medicare Annual (Subsequent) preventive examination.  Last AWV-09/23/2017    Objective:     Vitals: BP (!) 142/82 (BP Location: Left Arm, Patient Position: Sitting)   Pulse 65   Temp 98 F (36.7 C) (Oral)   Ht 5\' 8"  (1.727 m)   Wt 184 lb (83.5 kg)   SpO2 97%   BMI 27.98 kg/m   Body mass index is 27.98 kg/m.  Advanced Directives 09/29/2018 09/23/2017 01/28/2017 06/18/2016 01/30/2016  Does Patient Have a Medical Advance Directive? Yes Yes Yes Yes Yes  Type of Paramedic of [place];Living will [place];Living will [place];Living will [place];Living will [place];Living will  Does patient want to make changes to medical advance directive? No - Patient declined No - Patient declined - No - Patient declined No - Patient declined  Copy of [place] in Chart? No - copy requested No - copy requested No - copy requested No - copy requested No - copy requested    Tobacco Social History   Tobacco Use  Smoking Status Former Smoker  . Last attempt to quit: 10/04/1990  . Years since quitting: 28.0  Smokeless Tobacco Never Used     Counseling given: Not Answered   Clinical Intake:  Pre-visit preparation completed: No  Pain : No/denies pain     Diabetes: No  How often do you need to have someone help you when you read instructions, pamphlets, or other written materials from your doctor or pharmacy?: 1 - Never What is the last grade level you completed in school?: 2 years college  Interpreter Needed?: No  Information entered by :: [name], RN  Past Medical History:  Diagnosis Date  . Cancer ([place]) 2001   breast   . Cataracts, bilateral   . GERD (gastroesophageal reflux disease)   . Hyperlipidemia    Past Surgical History:  Procedure Laterality Date  . BREAST SURGERY    .  RADIAL KERATOTOMY     Family History  Problem Relation Age of Onset  . Hyperlipidemia Mother   . Esophageal cancer Cousin 45  . Colon cancer Neg Hx   . Stomach cancer Neg Hx    Social History   Socioeconomic History  . Marital status: Divorced    Spouse name: Not on file  . Number of children: Not on file  . Years of education: Not on file  . Highest education level: Not on file  Occupational History  . Not on file  Social Needs  . Financial resource strain: Not hard at all  . Food insecurity:    Worry: Never true    Inability: Never true  . Transportation needs:    Medical: No    Non-medical: No  Tobacco Use  . Smoking status: Former Smoker    Last attempt to quit: 10/04/1990    Years since quitting: 28.0  . Smokeless tobacco: Never Used  Substance and Sexual Activity  . Alcohol use: Yes    Alcohol/week: 13.0 - 15.0 standard drinks    Types: 8 Glasses of wine, 5 - 7 Standard drinks or equivalent per week  . Drug use: No  . Sexual activity: Not on file  Lifestyle  . Physical activity:    Days per week: 4 days    Minutes per session: 60 min  . Stress: Only a little  Relationships  .  Social connections:    Talks on phone: Twice a week    Gets together: Once a week    Attends religious service: Never    Active member of club or organization: No    Attends meetings of clubs or organizations: Never    Relationship status: Divorced  Other Topics Concern  . Not on file  Social History Narrative   Diet:      Do you drink/ eat things with caffeine? Coffee 2-3 cups /day      Marital status:  Divorced                             What year were you married ? 2004      Do you live in a house, apartment,assistred living, condo, trailer, etc.)? House      Is it one or more stories? 1      How many persons live in your home ? 1      Do you have any pets in your home ?(please list)  1 dog      Current or past profession: Residential cleaning      Do you exercise?                               Type & how often: Rarely      Do you have a living will? Yes      Do you have a DNR form?   No                    If not, do you want to discuss one? Yes      Do you have signed POA?HPOA forms?   Yes              If so, please bring to your        appointment       Outpatient Encounter Medications as of 09/29/2018  Medication Sig  . cholecalciferol (VITAMIN D) 1000 UNITS tablet Take 1,000 Units by mouth daily. Patient unsure of dosage  . Cyanocobalamin (VITAMIN B 12 PO) Take 1 tablet by mouth.  . fish oil-omega-3 fatty acids 1000 MG capsule Take 1 g by mouth daily. Not sure why she takes this. Not working for fatigue  . LORazepam (ATIVAN) 1 MG tablet Take 1 tablet by mouth 3 times a day  . Multiple Vitamin (MULTIVITAMIN) tablet Take 1 tablet by mouth daily.  Marland Kitchen RA CALCIUM HI-CAL PO Take by mouth. Take 3 tablets 1 time daily  . ranitidine (ZANTAC) 150 MG tablet TAKE 1 TABLET BY MOUTH EVERY MORNING AND 1/2 TABLET EVERY NIGHT AT BEDTIME  . simvastatin (ZOCOR) 10 MG tablet TAKE 1 TABLET BY MOUTH ONCE DAILY FOR CHOLESTEROL  . Tdap (BOOSTRIX) 5-2.5-18.5 LF-MCG/0.5 injection Inject 0.5 mLs into the muscle once.  . venlafaxine (EFFEXOR) 37.5 MG tablet TAKE 1 TABLET(37.5 MG) BY MOUTH TWICE DAILY  . zolpidem (AMBIEN) 10 MG tablet Take 1 tablet (10 mg total) by mouth at bedtime as needed for sleep.  Marland Kitchen Zoster Vaccine Adjuvanted [facility]) injection Inject 0.5 mLs into the muscle once.  . [DISCONTINUED] Cyanocobalamin (B-12 PO) Take 1 tablet by mouth daily at 3 pm.   No facility-administered encounter medications on file as of 09/29/2018.     Activities of Daily Living In your present state of health, do you have any difficulty performing the following  activities: 09/29/2018  Hearing? N  Vision? N  Difficulty concentrating or making decisions? N  Walking or climbing stairs? N  Dressing or bathing? N  Doing errands, shopping? N  Preparing Food and eating ? N  Using  the Toilet? N  In the past six months, have you accidently leaked urine? Y  Comment wears pads for stress incontinence  Do you have problems with loss of bowel control? N  Managing your Medications? N  Managing your Finances? N  Housekeeping or managing your Housekeeping? N  Some recent data might be hidden    Patient Care Team: Lauree Chandler, NP as PCP - General (Geriatric Medicine) Webb Laws, [place] as Referring Physician (Optometry) Druscilla Brownie, MD as Consulting Physician (Dermatology)    Assessment:   This is a routine wellness examination for [place].  Exercise Activities and Dietary recommendations Current Exercise Habits: The patient has a physically strenous job, but has no regular exercise apart from work., Exercise limited by: None identified  Goals    . Weight (lb) < 170 lb (77.1 kg)     Pt would like to get her weight to 170 lbs       Fall Risk Fall Risk  09/29/2018 09/23/2017 09/23/2017 06/18/2016 01/30/2016  Falls in the past year? 0 No No Yes No  Number falls in past yr: - - - 1 -  Injury with Fall? - - - Yes -   Is the patient's home free of loose throw rugs in walkways, pet beds, electrical cords, etc?   yes      Grab bars in the bathroom? no      Handrails on the stairs?   yes      Adequate lighting?   yes  Depression Screen PHQ 2/9 Scores 09/29/2018 09/23/2017 09/23/2017 06/18/2016  PHQ - 2 Score 0 1 1 2      Cognitive Function MMSE - Mini Mental State Exam 09/29/2018 09/23/2017 06/18/2016  Orientation to time 5 5 5   Orientation to Place 5 5 5   Registration 3 3 3   Attention/ Calculation 5 5 5   Recall 3 2 2   Language- name 2 objects 2 2 2   Language- repeat 1 1 1   Language- follow 3 step command 3 3 3   Language- read & follow direction 1 1 1   Write a sentence 1 1 1   Copy design 1 1 1   Total score 30 29 29         Immunization History  Administered Date(s) Administered  . Influenza,inj,Quad PF,6+ Mos 06/18/2016, 09/23/2017  .  Influenza-Unspecified 09/01/2018  . Pneumococcal Conjugate-13 06/18/2016  . Pneumococcal Polysaccharide-23 09/23/2017  . Zoster 06/05/2015    Qualifies for Shingles Vaccine? Yes, educated and ordered to pharmacy  Screening Tests Health Maintenance  Topic Date Due  . Samul Dada  06[birth year]/1969  . INFLUENZA VACCINE  05/04/2018  . MAMMOGRAM  10/07/2018  . COLONOSCOPY  01/28/2022  . DEXA SCAN  Completed  . Hepatitis C Screening  Completed  . PNA vac Low Risk Adult  Completed    Cancer Screenings: Lung: Low Dose CT Chest recommended if Age 89-80 years, 30 pack-year currently smoking OR have quit w/in 15years. Patient does not qualify. Breast:  Up to date on Mammogram? Due, ordered Up to date of Bone Density/Dexa? Due, ordered Colorectal: up to date  Additional Screenings:  Hepatitis C Screening: declined TDAP due: ordered    Plan:    I have personally reviewed and addressed the Medicare Annual Wellness questionnaire and have noted the  following in the patient's chart:  A. Medical and social history B. Use of alcohol, tobacco or illicit drugs  C. Current medications and supplements D. Functional ability and status E.  Nutritional status F.  Physical activity G. Advance directives H. List of other physicians I.  Hospitalizations, surgeries, and ER visits in previous 12 months J.  [place] to include hearing, vision, cognitive, depression L. Referrals and appointments - none  In addition, I have reviewed and discussed with patient certain preventive protocols, quality metrics, and best practice recommendations. A written personalized care plan for preventive services as well as general preventive health recommendations were provided to patient.  See attached scanned questionnaire for additional information.   Signed,   [name], RN Nurse Health Advisor  Patient Concerns: None

## 2018-10-03 ENCOUNTER — Encounter: Payer: Self-pay | Admitting: Nurse Practitioner

## 2018-10-20 ENCOUNTER — Ambulatory Visit (INDEPENDENT_AMBULATORY_CARE_PROVIDER_SITE_OTHER): Payer: Medicare Other | Admitting: Nurse Practitioner

## 2018-10-20 ENCOUNTER — Encounter: Payer: Self-pay | Admitting: Nurse Practitioner

## 2018-10-20 VITALS — BP 118/78 | HR 60 | Temp 98.3°F | Ht 68.0 in | Wt 184.0 lb

## 2018-10-20 DIAGNOSIS — K219 Gastro-esophageal reflux disease without esophagitis: Secondary | ICD-10-CM

## 2018-10-20 DIAGNOSIS — F419 Anxiety disorder, unspecified: Secondary | ICD-10-CM | POA: Diagnosis not present

## 2018-10-20 DIAGNOSIS — Z0000 Encounter for general adult medical examination without abnormal findings: Secondary | ICD-10-CM | POA: Diagnosis not present

## 2018-10-20 DIAGNOSIS — G4700 Insomnia, unspecified: Secondary | ICD-10-CM

## 2018-10-20 DIAGNOSIS — E782 Mixed hyperlipidemia: Secondary | ICD-10-CM | POA: Diagnosis not present

## 2018-10-20 DIAGNOSIS — J069 Acute upper respiratory infection, unspecified: Secondary | ICD-10-CM

## 2018-10-20 DIAGNOSIS — C50919 Malignant neoplasm of unspecified site of unspecified female breast: Secondary | ICD-10-CM

## 2018-10-20 MED ORDER — LORAZEPAM 0.5 MG PO TABS: 0.2500 mg | ORAL_TABLET | Freq: Every day | ORAL | Status: DC | PRN

## 2018-10-20 MED ORDER — ZOLPIDEM TARTRATE 5 MG PO TABS: 2.5000 mg | ORAL_TABLET | Freq: Every evening | ORAL | 0 refills | Status: DC | PRN

## 2018-10-20 NOTE — Progress Notes (Signed)
Provider: Lauree Chandler, NP  Patient Care Team: Lauree Chandler, NP as PCP - General (Geriatric Medicine) Webb Laws, [place] as Referring Physician (Optometry) Druscilla Brownie, MD as Consulting Physician (Dermatology)  Extended Emergency Contact Information Primary Emergency Contact: Hardaway,Carolyn Address: [address]          [place], [place] [address] Johnnette Litter of [place] Phone: [phone] Work Phone: [phone] Mobile Phone: [phone] Relation: Sister Allergies  Allergen Reactions  . Codeine Nausea Only    Also has dizziness  . Omeprazole Other (See Comments)    GI upset    Code Status: FULL  Goals of Care: Advanced Directive information Advanced Directives 09/29/2018  Does Patient Have a Medical Advance Directive? Yes  Type of Paramedic of [place];Living will  Does patient want to make changes to medical advance directive? No - Patient declined  Copy of [place] in Chart? No - copy requested     Chief Complaint  Patient presents with  . Medical Management of Chronic Issues    Extended Visit and EKG. AWV completed 09/29/2018. Patient c/o stomach discomfort   . Rash    Patient c/o rash under left breast   . URI    Cold Symptoms     HPI: Patient is a 68 y.o. female seen in today for an annual wellness exam.    Diet? Attempts to follow heart healthy diet.   Exercise? 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    Dentition: seeing dentist every 6 months.   Ophthalmology appt: yearly  Routine specialist: GYN ([facility]), gets breast exams with them. Dermatology   Hyperlipidemia- was not consistent with taking simvastatin through December. LDL elevated and she started taking medication back consistently after labs resulted.   Insomnia- will need Ambien. occasionally, will only take 2.5 mg at night IF Needed. A Rx will last along time. Last refill was 2018  URI- has  had sinus congestion for several days, today is better. Worse in the morning but improves throughout the day. Feeling better today.   Depression screen [facility] 2/9 09/29/2018 09/23/2017 09/23/2017 06/18/2016  Decreased Interest 0 0 0 1  Down, Depressed, Hopeless 0 1 1 1   PHQ - 2 Score 0 1 1 2     Fall Risk  10/20/2018 09/29/2018 09/23/2017 09/23/2017 06/18/2016  Falls in the past year? 1 0 No No Yes  Number falls in past yr: 0 - - - 1  Injury with Fall? 0 - - - Yes   MMSE - Mini Mental State Exam 09/29/2018 09/23/2017 06/18/2016  Orientation to time 5 5 5   Orientation to Place 5 5 5   Registration 3 3 3   Attention/ Calculation 5 5 5   Recall 3 2 2   Language- name 2 objects 2 2 2   Language- repeat 1 1 1   Language- follow 3 step command 3 3 3   Language- read & follow direction 1 1 1   Write a sentence 1 1 1   Copy design 1 1 1   Total score 30 29 29      Health Maintenance  Topic Date Due  . TETANUS/TDAP  06/12/1969  . MAMMOGRAM  11/05/2019  . COLONOSCOPY  01/28/2022  . INFLUENZA VACCINE  Completed  . DEXA SCAN  Completed  . Hepatitis C Screening  Completed  . PNA vac Low Risk Adult  Completed     Past Medical History:  Diagnosis Date  . Cancer ([place]) 2001   breast   . Cataracts,  bilateral   . GERD (gastroesophageal reflux disease)   . Hyperlipidemia     Past Surgical History:  Procedure Laterality Date  . BREAST SURGERY    . RADIAL KERATOTOMY      Social History   Socioeconomic History  . Marital status: Divorced    Spouse name: Not on file  . Number of children: Not on file  . Years of education: Not on file  . Highest education level: Not on file  Occupational History  . Not on file  Social Needs  . Financial resource strain: Not hard at all  . Food insecurity:    Worry: Never true    Inability: Never true  . Transportation needs:    Medical: No    Non-medical: No  Tobacco Use  . Smoking status: Former Smoker    Last attempt to quit: 10/04/1990    Years  since quitting: 28.0  . Smokeless tobacco: Never Used  Substance and Sexual Activity  . Alcohol use: Yes    Alcohol/week: 13.0 - 15.0 standard drinks    Types: 8 Glasses of wine, 5 - 7 Standard drinks or equivalent per week  . Drug use: No  . Sexual activity: Not on file  Lifestyle  . Physical activity:    Days per week: 4 days    Minutes per session: 60 min  . Stress: Only a little  Relationships  . Social connections:    Talks on phone: Twice a week    Gets together: Once a week    Attends religious service: Never    Active member of club or organization: No    Attends meetings of clubs or organizations: Never    Relationship status: Divorced  Other Topics Concern  . Not on file  Social History Narrative   Diet:      Do you drink/ eat things with caffeine? Coffee 2-3 cups /day      Marital status:  Divorced                             What year were you married ? 2004      Do you live in a house, apartment,assistred living, condo, trailer, etc.)? House      Is it one or more stories? 1      How many persons live in your home ? 1      Do you have any pets in your home ?(please list)  1 dog      Current or past profession: Residential cleaning      Do you exercise?                              Type & how often: Rarely      Do you have a living will? Yes      Do you have a DNR form?   No                    If not, do you want to discuss one? Yes      Do you have signed POA?HPOA forms?   Yes              If so, please bring to your        appointment       Family History  Problem Relation Age of Onset  . Hyperlipidemia Mother   .  Esophageal cancer Cousin 74  . Colon cancer Neg Hx   . Stomach cancer Neg Hx     Review of Systems:  Review of Systems  Constitutional: Negative for chills, fever and weight loss.  HENT: Positive for congestion. Negative for sore throat and tinnitus.   Respiratory: Negative for cough, sputum production and shortness of breath.     Cardiovascular: Negative for chest pain, palpitations and leg swelling.  Gastrointestinal: Negative for abdominal pain, constipation, diarrhea and heartburn.  Genitourinary: Negative for dysuria, frequency and urgency.  Musculoskeletal: Negative for back pain, falls, joint pain and myalgias.  Skin: Negative.   Neurological: Negative for dizziness and headaches.  Psychiatric/Behavioral: Negative for depression and memory loss. The patient does not have insomnia.      Allergies as of 10/20/2018      Reactions   Codeine Nausea Only   Also has dizziness   Omeprazole Other (See Comments)   GI upset       Medication List       Accurate as of October 20, 2018  2:21 PM. Always use your most recent med list.        cholecalciferol 1000 units tablet Commonly known as:  VITAMIN D Take 1,000 Units by mouth daily. Patient unsure of dosage   fish oil-omega-3 fatty acids 1000 MG capsule Take 1 g by mouth daily. Not sure why she takes this. Not working for fatigue   LORazepam 0.5 MG tablet Commonly known as:  ATIVAN Take 0.5 tablets (0.25 mg total) by mouth daily as needed for anxiety.   multivitamin tablet Take 1 tablet by mouth daily.   RA CALCIUM HI-CAL PO Take by mouth. Take 3 tablets 1 time daily   simvastatin 10 MG tablet Commonly known as:  ZOCOR TAKE 1 TABLET BY MOUTH ONCE DAILY FOR CHOLESTEROL   TAGAMET PO Take by mouth 2 (two) times daily.   Tdap 5-2.5-18.5 LF-MCG/0.5 injection Commonly known as:  BOOSTRIX Inject 0.5 mLs into the muscle once.   venlafaxine 37.5 MG tablet Commonly known as:  EFFEXOR TAKE 1 TABLET(37.5 MG) BY MOUTH TWICE DAILY   VITAMIN B 12 PO Take 1 tablet by mouth.   zolpidem 5 MG tablet Commonly known as:  AMBIEN Take 0.5 tablets (2.5 mg total) by mouth at bedtime as needed for sleep.   Zoster Vaccine Adjuvanted injection Commonly known as:  SHINGRIX Inject 0.5 mLs into the muscle once.         Physical Exam: Vitals:   10/20/18  1339  BP: 118/78  Pulse: 60  Temp: 98.3 F (36.8 C)  TempSrc: Oral  SpO2: 96%  Weight: 184 lb (83.5 kg)  Height: 5\' 8"  (1.727 m)   Body mass index is 27.98 kg/m. Physical Exam Vitals signs reviewed.  Constitutional:      General: She is not in acute distress.    Appearance: She is well-developed.  HENT:     Head: Normocephalic and atraumatic.     Right Ear: External ear normal.     Left Ear: External ear normal.     Mouth/Throat:     Pharynx: No oropharyngeal exudate.  Eyes:     General: No scleral icterus.    Pupils: Pupils are equal, round, and reactive to light.  Neck:     Musculoskeletal: Normal range of motion and neck supple.     Thyroid: No thyromegaly.     Vascular: No carotid bruit.     Trachea: No tracheal deviation.  Cardiovascular:  Rate and Rhythm: Normal rate and regular rhythm.     Heart sounds: No murmur. No friction rub. No gallop.      Comments: No LE edema b/l. No calf TTP Pulmonary:     Effort: Pulmonary effort is normal. No respiratory distress.     Breath sounds: Normal breath sounds. No wheezing or rales.  Abdominal:     General: Bowel sounds are normal. There is no distension.     Palpations: Abdomen is soft. There is no hepatomegaly or mass.     Tenderness: There is no abdominal tenderness. There is no guarding or rebound.     Hernia: No hernia is present.  Genitourinary:    Comments: Deferred to GYN Musculoskeletal:        General: No deformity.  Lymphadenopathy:     Cervical: Cervical adenopathy present.     Left cervical: Superficial cervical adenopathy (tender, with URI) present. No deep or posterior cervical adenopathy.  Skin:    General: Skin is warm and dry.     Findings: No rash.     Comments: Right ACW tattoo  Neurological:     Mental Status: She is alert and oriented to person, place, and time.     Deep Tendon Reflexes: Reflexes are normal and symmetric.  Psychiatric:        Behavior: Behavior normal.        Thought  Content: Thought content normal.        Judgment: Judgment normal.     Labs reviewed: Basic Metabolic Panel: Recent Labs    09/29/18 1017  NA 137  K 4.4  CL 103  CO2 27  GLUCOSE 84  BUN 12  CREATININE 0.78  CALCIUM 9.4  TSH 2.46   Liver Function Tests: Recent Labs    09/29/18 1017  AST 17  ALT 15  BILITOT 0.4  PROT 6.7   No results for input(s): LIPASE, AMYLASE in the last 8760 hours. No results for input(s): AMMONIA in the last 8760 hours. CBC: Recent Labs    09/29/18 1017  WBC 5.1  NEUTROABS 2,764  HGB 13.2  HCT 39.2  MCV 91.2  PLT 234   Lipid Panel: Recent Labs    09/29/18 1017  CHOL 251*  HDL 74  LDLCALC 155*  TRIG 108  CHOLHDL 3.4   No results found for: HGBA1C  Procedures: No results found.  Assessment/Plan 1. Mixed hyperlipidemia -has restarted zocor, dietary modifications encouraged - EKG 12-Lead- SR with PVS, RBBB noted rate 68 - COMPLETE METABOLIC PANEL WITH GFR; Future - Lipid panel; Future  2. Anxiety -controlled on Effexor twice daily - LORazepam (ATIVAN) 0.5 MG tablet; Take 0.5 tablets (0.25 mg total) by mouth daily as needed for anxiety.  3. Insomnia, unspecified type -controlled occasionally  - zolpidem (AMBIEN) 5 MG tablet; Take 0.5 tablets (2.5 mg total) by mouth at bedtime as needed for sleep.  Dispense: 30 tablet; Refill: 0  4. Wellness examination - The patient was counseled regarding the appropriate use of alcohol, regular self-examination of the breasts on a monthly basis, prevention of dental and periodontal disease, diet, regular sustained exercise for at least 30 minutes 5 times per week, routine screening interval for mammogram as recommended by the [place] and ACOG, importance of regular PAP smears, tobacco use,  and recommended schedule for GI hemoccult testing, colonoscopy, cholesterol, thyroid and diabetes screening.  5. Gastroesophageal reflux disease without esophagitis She stopped zantac with  worsening of symptoms. Started on tagamet with improvement. Will  call with dose she is taking. Discussed dietary modifications.   6. Malignant neoplasm of female breast, unspecified estrogen receptor status, unspecified laterality, unspecified site of breast ([place]) Breast cancer in 2001 s/p lumpectomy x 2 and XRT. No chemotx req'd. She completed 5 yrs of tamoxifen tx. mammogram is up to date. Reports she has been released from oncologist.   7. URI improving. Continue symptom management.   Next appt: 6 months, lab work prior to appt.  Carlos American. [place], [facility] [phone]

## 2018-10-20 NOTE — Patient Instructions (Signed)
Call with dosage of tagamet    Health Maintenance, Female Adopting a healthy lifestyle and getting preventive care can go a long way to promote health and wellness. Talk with your health care provider about what schedule of regular examinations is right for you. This is a good chance for you to check in with your provider about disease prevention and staying healthy. In between checkups, there are plenty of things you can do on your own. Experts have done a lot of research about which lifestyle changes and preventive measures are most likely to keep you healthy. Ask your health care provider for more information. Weight and diet Eat a healthy diet  Be sure to include plenty of vegetables, fruits, low-fat dairy products, and lean protein.  Do not eat a lot of foods high in solid fats, added sugars, or salt.  Get regular exercise. This is one of the most important things you can do for your health. ? Most adults should exercise for at least 150 minutes each week. The exercise should increase your heart rate and make you sweat (moderate-intensity exercise). ? Most adults should also do strengthening exercises at least twice a week. This is in addition to the moderate-intensity exercise. Maintain a healthy weight  Body mass index (BMI) is a measurement that can be used to identify possible weight problems. It estimates body fat based on height and weight. Your health care provider can help determine your BMI and help you achieve or maintain a healthy weight.  For females 83 years of age and older: ? A BMI below 18.5 is considered underweight. ? A BMI of 18.5 to 24.9 is normal. ? A BMI of 25 to 29.9 is considered overweight. ? A BMI of 30 and above is considered obese. Watch levels of cholesterol and blood lipids  You should start having your blood tested for lipids and cholesterol at 69 years of age, then have this test every 5 years.  You may need to have your cholesterol levels checked  more often if: ? Your lipid or cholesterol levels are high. ? You are older than 69 years of age. ? You are at high risk for heart disease. Cancer screening Lung Cancer  Lung cancer screening is recommended for adults 44-24 years old who are at high risk for lung cancer because of a history of smoking.  A yearly low-dose CT scan of the lungs is recommended for people who: ? Currently smoke. ? Have quit within the past 15 years. ? Have at least a 30-pack-year history of smoking. A pack year is smoking an average of one pack of cigarettes a day for 1 year.  Yearly screening should continue until it has been 15 years since you quit.  Yearly screening should stop if you develop a health problem that would prevent you from having lung cancer treatment. Breast Cancer  Practice breast self-awareness. This means understanding how your breasts normally appear and feel.  It also means doing regular breast self-exams. Let your health care provider know about any changes, no matter how small.  If you are in your 20s or 30s, you should have a clinical breast exam (CBE) by a health care provider every 1-3 years as part of a regular health exam.  If you are 82 or older, have a CBE every year. Also consider having a breast X-ray (mammogram) every year.  If you have a family history of breast cancer, talk to your health care provider about genetic screening.  If you  are at high risk for breast cancer, talk to your health care provider about having an MRI and a mammogram every year.  Breast cancer gene (BRCA) assessment is recommended for women who have family members with BRCA-related cancers. BRCA-related cancers include: ? Breast. ? Ovarian. ? Tubal. ? Peritoneal cancers.  Results of the assessment will determine the need for genetic counseling and BRCA1 and BRCA2 testing. Cervical Cancer Your health care provider may recommend that you be screened regularly for cancer of the pelvic organs  (ovaries, uterus, and vagina). This screening involves a pelvic examination, including checking for microscopic changes to the surface of your cervix (Pap test). You may be encouraged to have this screening done every 3 years, beginning at age 21.  For women ages 30-65, health care providers may recommend pelvic exams and Pap testing every 3 years, or they may recommend the Pap and pelvic exam, combined with testing for human papilloma virus (HPV), every 5 years. Some types of HPV increase your risk of cervical cancer. Testing for HPV may also be done on women of any age with unclear Pap test results.  Other health care providers may not recommend any screening for nonpregnant women who are considered low risk for pelvic cancer and who do not have symptoms. Ask your health care provider if a screening pelvic exam is right for you.  If you have had past treatment for cervical cancer or a condition that could lead to cancer, you need Pap tests and screening for cancer for at least 20 years after your treatment. If Pap tests have been discontinued, your risk factors (such as having a new sexual partner) need to be reassessed to determine if screening should resume. Some women have medical problems that increase the chance of getting cervical cancer. In these cases, your health care provider may recommend more frequent screening and Pap tests. Colorectal Cancer  This type of cancer can be detected and often prevented.  Routine colorectal cancer screening usually begins at 69 years of age and continues through 69 years of age.  Your health care provider may recommend screening at an earlier age if you have risk factors for colon cancer.  Your health care provider may also recommend using home test kits to check for hidden blood in the stool.  A small camera at the end of a tube can be used to examine your colon directly (sigmoidoscopy or colonoscopy). This is done to check for the earliest forms of  colorectal cancer.  Routine screening usually begins at age 50.  Direct examination of the colon should be repeated every 5-10 years through 69 years of age. However, you may need to be screened more often if early forms of precancerous polyps or small growths are found. Skin Cancer  Check your skin from head to toe regularly.  Tell your health care provider about any new moles or changes in moles, especially if there is a change in a mole's shape or color.  Also tell your health care provider if you have a mole that is larger than the size of a pencil eraser.  Always use sunscreen. Apply sunscreen liberally and repeatedly throughout the day.  Protect yourself by wearing long sleeves, pants, a wide-brimmed hat, and sunglasses whenever you are outside. Heart disease, diabetes, and high blood pressure  High blood pressure causes heart disease and increases the risk of stroke. High blood pressure is more likely to develop in: ? People who have blood pressure in the high end of   the normal range (130-139/85-89 mm Hg). ? People who are overweight or obese. ? People who are African American.  If you are 56-68 years of age, have your blood pressure checked every 3-5 years. If you are 49 years of age or older, have your blood pressure checked every year. You should have your blood pressure measured twice-once when you are at a hospital or clinic, and once when you are not at a hospital or clinic. Record the average of the two measurements. To check your blood pressure when you are not at a hospital or clinic, you can use: ? An automated blood pressure machine at a pharmacy. ? A home blood pressure monitor.  If you are between 17 years and 65 years old, ask your health care provider if you should take aspirin to prevent strokes.  Have regular diabetes screenings. This involves taking a blood sample to check your fasting blood sugar level. ? If you are at a normal weight and have a low risk for  diabetes, have this test once every three years after 69 years of age. ? If you are overweight and have a high risk for diabetes, consider being tested at a younger age or more often. Preventing infection Hepatitis B  If you have a higher risk for hepatitis B, you should be screened for this virus. You are considered at high risk for hepatitis B if: ? You were born in a country where hepatitis B is common. Ask your health care provider which countries are considered high risk. ? Your parents were born in a high-risk country, and you have not been immunized against hepatitis B (hepatitis B vaccine). ? You have HIV or AIDS. ? You use needles to inject street drugs. ? You live with someone who has hepatitis B. ? You have had sex with someone who has hepatitis B. ? You get hemodialysis treatment. ? You take certain medicines for conditions, including cancer, organ transplantation, and autoimmune conditions. Hepatitis C  Blood testing is recommended for: ? Everyone born from [birth year] through 1966. ? Anyone with known risk factors for hepatitis C. Sexually transmitted infections (STIs)  You should be screened for sexually transmitted infections (STIs) including gonorrhea and chlamydia if: ? You are sexually active and are younger than 69 years of age. ? You are older than 69 years of age and your health care provider tells you that you are at risk for this type of infection. ? Your sexual activity has changed since you were last screened and you are at an increased risk for chlamydia or gonorrhea. Ask your health care provider if you are at risk.  If you do not have HIV, but are at risk, it may be recommended that you take a prescription medicine daily to prevent HIV infection. This is called pre-exposure prophylaxis (PrEP). You are considered at risk if: ? You are sexually active and do not regularly use condoms or know the HIV status of your partner(s). ? You take drugs by injection. ? You are  sexually active with a partner who has HIV. Talk with your health care provider about whether you are at high risk of being infected with HIV. If you choose to begin PrEP, you should first be tested for HIV. You should then be tested every 3 months for as long as you are taking PrEP. Pregnancy  If you are premenopausal and you may become pregnant, ask your health care provider about preconception counseling.  If you may become pregnant, take 400  to 800 micrograms (mcg) of folic acid every day.  If you want to prevent pregnancy, talk to your health care provider about birth control (contraception). Osteoporosis and menopause  Osteoporosis is a disease in which the bones lose minerals and strength with aging. This can result in serious bone fractures. Your risk for osteoporosis can be identified using a bone density scan.  If you are 19 years of age or older, or if you are at risk for osteoporosis and fractures, ask your health care provider if you should be screened.  Ask your health care provider whether you should take a calcium or vitamin D supplement to lower your risk for osteoporosis.  Menopause may have certain physical symptoms and risks.  Hormone replacement therapy may reduce some of these symptoms and risks. Talk to your health care provider about whether hormone replacement therapy is right for you. Follow these instructions at home:  Schedule regular health, dental, and eye exams.  Stay current with your immunizations.  Do not use any tobacco products including cigarettes, chewing tobacco, or electronic cigarettes.  If you are pregnant, do not drink alcohol.  If you are breastfeeding, limit how much and how often you drink alcohol.  Limit alcohol intake to no more than 1 drink per day for nonpregnant women. One drink equals 12 ounces of beer, 5 ounces of wine, or 1 ounces of hard liquor.  Do not use street drugs.  Do not share needles.  Ask your health care  provider for help if you need support or information about quitting drugs.  Tell your health care provider if you often feel depressed.  Tell your health care provider if you have ever been abused or do not feel safe at home. This information is not intended to replace advice given to you by your health care provider. Make sure you discuss any questions you have with your health care provider. Document Released: 04/05/2011 Document Revised: 02/26/2016 Document Reviewed: 06/24/2015 Elsevier Interactive Patient Education  2019 [employer]  Heartburn Heartburn is a type of pain or discomfort that can happen in the throat or chest. It is often described as a burning pain. It may also cause a bad, acid-like taste in the mouth. Heartburn may feel worse when you lie down or bend over, and it is often worse at night. Heartburn may be caused by stomach contents that move back up into the esophagus (reflux). Follow these instructions at home: Eating and drinking   Avoid certain foods and drinks as told by your health care provider. This may include: ? Coffee and tea (with or without caffeine). ? Drinks that contain alcohol. ? Energy drinks and sports drinks. ? Carbonated drinks or sodas. ? Chocolate and cocoa. ? Peppermint and mint flavorings. ? Garlic and onions. ? Horseradish. ? Spicy and acidic foods, including peppers, chili powder, curry powder, vinegar, hot sauces, and barbecue sauce. ? Citrus fruit juices and citrus fruits, such as oranges, lemons, and limes. ? Tomato-based foods, such as red sauce, chili, salsa, and pizza with red sauce. ? Fried and fatty foods, such as donuts, french fries, potato chips, and high-fat dressings. ? High-fat meats, such as hot dogs and fatty cuts of red and white meats, such as rib eye steak, sausage, ham, and bacon. ? High-fat dairy items, such as whole milk, butter, and cream cheese.  Eat small, frequent meals instead of large meals.  Avoid drinking  large amounts of liquid with your meals.  Avoid eating meals during the 2-3  hours before bedtime.  Avoid lying down right after you eat.  Do not exercise right after you eat. Lifestyle      If you are overweight, reduce your weight to an amount that is healthy for you. Ask your health care provider for guidance about a safe weight loss goal.  Do not use any products that contain nicotine or tobacco, such as cigarettes, e-cigarettes, and chewing tobacco. These can make your symptoms worse. If you need help quitting, ask your health care provider.  Wear loose-fitting clothing. Do not wear anything tight around your waist that causes pressure on your abdomen.  Raise (elevate) the head of your bed about 6 inches (15 cm) when you sleep.  Try to reduce your stress, such as with yoga or meditation. If you need help reducing stress, ask your health care provider. General instructions  Pay attention to any changes in your symptoms.  Take over-the-counter and prescription medicines only as told by your health care provider. ? Do not take aspirin, ibuprofen, or other NSAIDs unless your health care provider told you to do so. ? Stop medicines only as told by your health care provider. If you stop taking some medicines too quickly, your symptoms may get worse.  Keep all follow-up visits as told by your health care provider. This is important. Contact a health care provider if:  You have new symptoms.  You have unexplained weight loss.  You have difficulty swallowing, or it hurts to swallow.  You have wheezing or a persistent cough.  Your symptoms do not improve with treatment.  You have frequent heartburn for more than 2 weeks. Get help right away if:  You have pain in your arms, neck, jaw, teeth, or back.  You feel sweaty, dizzy, or light-headed.  You have chest pain or shortness of breath.  You vomit and your vomit looks like blood or coffee grounds.  Your stool is bloody or  black. These symptoms may represent a serious problem that is an emergency. Do not wait to see if the symptoms will go away. Get medical help right away. Call your local emergency services (911 in the [place] Do not drive yourself to the hospital. Summary  Heartburn is a type of pain or discomfort that can happen in the throat or chest. It is often described as a burning pain. It may also cause a bad, acid-like taste in the mouth.  Avoid certain foods and drinks as told by your health care provider.  Take over-the-counter and prescription medicines only as told by your health care provider. Do not take aspirin, ibuprofen, or other NSAIDs unless your health care provider told you to do so.  Contact a health care provider if your symptoms do not improve or they get worse. This information is not intended to replace advice given to you by your health care provider. Make sure you discuss any questions you have with your health care provider. Document Released: 02/06/2009 Document Revised: 02/20/2018 Document Reviewed: 02/20/2018 Elsevier Interactive Patient Education  2019 [employer].

## 2018-11-17 DIAGNOSIS — Z853 Personal history of malignant neoplasm of breast: Secondary | ICD-10-CM | POA: Diagnosis not present

## 2018-11-17 DIAGNOSIS — Z8269 Family history of other diseases of the musculoskeletal system and connective tissue: Secondary | ICD-10-CM | POA: Diagnosis not present

## 2018-11-17 DIAGNOSIS — Z8262 Family history of osteoporosis: Secondary | ICD-10-CM | POA: Diagnosis not present

## 2018-11-17 DIAGNOSIS — Z1231 Encounter for screening mammogram for malignant neoplasm of breast: Secondary | ICD-10-CM | POA: Diagnosis not present

## 2018-11-17 DIAGNOSIS — M8588 Other specified disorders of bone density and structure, other site: Secondary | ICD-10-CM | POA: Diagnosis not present

## 2018-11-17 LAB — HM DEXA SCAN

## 2018-11-17 LAB — HM MAMMOGRAPHY

## 2018-11-20 ENCOUNTER — Encounter: Payer: Self-pay | Admitting: *Deleted

## 2018-11-23 ENCOUNTER — Telehealth: Payer: Self-pay

## 2018-11-23 ENCOUNTER — Encounter: Payer: Self-pay | Admitting: *Deleted

## 2018-11-23 NOTE — Telephone Encounter (Signed)
Called patient regarding  Normal bone density results left message for her to return call

## 2018-11-24 NOTE — Telephone Encounter (Signed)
Call placed to patient and provided her with her bone density results

## 2019-01-22 ENCOUNTER — Other Ambulatory Visit: Payer: Self-pay | Admitting: *Deleted

## 2019-01-22 MED ORDER — SIMVASTATIN 10 MG PO TABS: ORAL_TABLET | ORAL | 1 refills | Status: DC

## 2019-01-22 NOTE — Telephone Encounter (Signed)
[facility] 

## 2019-01-28 ENCOUNTER — Encounter: Payer: Self-pay | Admitting: Nurse Practitioner

## 2019-01-29 ENCOUNTER — Other Ambulatory Visit: Payer: Self-pay | Admitting: *Deleted

## 2019-01-29 DIAGNOSIS — F39 Unspecified mood [affective] disorder: Secondary | ICD-10-CM

## 2019-01-29 MED ORDER — VENLAFAXINE HCL 37.5 MG PO TABS: ORAL_TABLET | ORAL | 3 refills | Status: DC

## 2019-01-29 NOTE — Telephone Encounter (Signed)
[facility] 

## 2019-04-20 ENCOUNTER — Other Ambulatory Visit: Payer: Medicare Other

## 2019-04-20 ENCOUNTER — Other Ambulatory Visit: Payer: Self-pay

## 2019-04-20 DIAGNOSIS — E782 Mixed hyperlipidemia: Secondary | ICD-10-CM

## 2019-04-21 LAB — COMPLETE METABOLIC PANEL WITH GFR
AG Ratio: 1.6 (calc) (ref 1.0–2.5)
ALT: 14 U/L (ref 6–29)
AST: 14 U/L (ref 10–35)
Albumin: 3.9 g/dL (ref 3.6–5.1)
Alkaline phosphatase (APISO): 44 U/L (ref 37–153)
BUN: 11 mg/dL (ref 7–25)
CO2: 27 mmol/L (ref 20–32)
Calcium: 9 mg/dL (ref 8.6–10.4)
Chloride: 105 mmol/L (ref 98–110)
Creat: 0.8 mg/dL (ref 0.50–0.99)
GFR, Est African American: 88 mL/min/{1.73_m2} (ref >=60)
GFR, Est Non African American: 76 mL/min/{1.73_m2} (ref >=60)
Globulin: 2.4 g/dL (calc) (ref 1.9–3.7)
Glucose, Bld: 95 mg/dL (ref 65–99)
Potassium: 4.9 mmol/L (ref 3.5–5.3)
Sodium: 139 mmol/L (ref 135–146)
Total Bilirubin: 0.3 mg/dL (ref 0.2–1.2)
Total Protein: 6.3 g/dL (ref 6.1–8.1)

## 2019-04-21 LAB — LIPID PANEL
Cholesterol: 215 mg/dL — ABNORMAL HIGH (ref <200)
HDL: 71 mg/dL (ref >=50)
LDL Cholesterol (Calc): 121 mg/dL (calc) — ABNORMAL HIGH
Non-HDL Cholesterol (Calc): 144 mg/dL (calc) — ABNORMAL HIGH (ref <130)
Total CHOL/HDL Ratio: 3 (calc) (ref <5.0)
Triglycerides: 122 mg/dL (ref <150)

## 2019-04-26 ENCOUNTER — Other Ambulatory Visit: Payer: Self-pay

## 2019-04-26 ENCOUNTER — Ambulatory Visit (INDEPENDENT_AMBULATORY_CARE_PROVIDER_SITE_OTHER): Payer: Medicare Other | Admitting: Nurse Practitioner

## 2019-04-26 ENCOUNTER — Encounter: Payer: Self-pay | Admitting: Nurse Practitioner

## 2019-04-26 VITALS — BP 138/80 | HR 70 | Temp 98.5°F | Ht 68.0 in | Wt 184.0 lb

## 2019-04-26 DIAGNOSIS — F39 Unspecified mood [affective] disorder: Secondary | ICD-10-CM

## 2019-04-26 DIAGNOSIS — G4700 Insomnia, unspecified: Secondary | ICD-10-CM | POA: Diagnosis not present

## 2019-04-26 DIAGNOSIS — K219 Gastro-esophageal reflux disease without esophagitis: Secondary | ICD-10-CM

## 2019-04-26 DIAGNOSIS — E782 Mixed hyperlipidemia: Secondary | ICD-10-CM

## 2019-04-26 MED ORDER — FAMOTIDINE 20 MG PO TABS: ORAL_TABLET | ORAL | 5 refills | Status: DC

## 2019-04-26 NOTE — Patient Instructions (Signed)

## 2019-04-26 NOTE — Progress Notes (Signed)
Careteam: Patient Care Team: Lauree Chandler, NP as PCP - General (Geriatric Medicine) Webb Laws, [place] as Referring Physician (Optometry) Druscilla Brownie, MD as Consulting Physician (Dermatology)  Advanced Directive information Does Patient Have a Medical Advance Directive?: Yes, Type of Advance Directive: [place];Living will, Does patient want to make changes to medical advance directive?: No - Patient declined  Allergies  Allergen Reactions  . Codeine Nausea Only    Also has dizziness  . Omeprazole Other (See Comments)    GI upset     Chief Complaint  Patient presents with  . Medical Management of Chronic Issues    6 month follow-up and discuss labs (copy printed)      HPI: Patient is a 68 y.o. female seen in the office today for routine follow up.  Today has been a day for her. Blood pressure a little up but feels like this is due to the day, generally blood pressure 120s-130s  Hyperlipidemia- working on diet and more consistently taking her cholesterol medication.    Mood disorder- stable on effexor.   Insomnia- stable, uses ambien rarely   GERD- controlled on pepcid, occasional break through   Hx of breast cancer- no longer following oncologist, continues to get mamogram.  Review of Systems:  Review of Systems  Constitutional: Negative for chills, fever and weight loss.  HENT: Negative for congestion, sore throat and tinnitus.   Respiratory: Negative for cough, sputum production and shortness of breath.   Cardiovascular: Negative for chest pain, palpitations and leg swelling.  Gastrointestinal: Negative for abdominal pain, constipation, diarrhea and heartburn.  Genitourinary: Negative for dysuria, frequency and urgency.  Musculoskeletal: Negative for back pain, falls, joint pain and myalgias.  Skin: Negative.   Neurological: Negative for dizziness and headaches.  Psychiatric/Behavioral: Negative for depression and memory loss.  The patient does not have insomnia.     Past Medical History:  Diagnosis Date  . Cancer ([place]) 2001   breast   . Cataracts, bilateral   . GERD (gastroesophageal reflux disease)   . Hyperlipidemia    Past Surgical History:  Procedure Laterality Date  . BREAST SURGERY    . RADIAL KERATOTOMY     Social History:   reports that she quit smoking about 28 years ago. She has never used smokeless tobacco. She reports current alcohol use of about 13.0 - 15.0 standard drinks of alcohol per week. She reports that she does not use drugs.  Family History  Problem Relation Age of Onset  . Hyperlipidemia Mother   . Esophageal cancer Cousin 38  . Colon cancer Neg Hx   . Stomach cancer Neg Hx     Medications: Patient's Medications  New Prescriptions   No medications on file  Previous Medications   CHOLECALCIFEROL (VITAMIN D) 1000 UNITS TABLET    Take 1,000 Units by mouth daily. Patient unsure of dosage   CYANOCOBALAMIN (VITAMIN B 12 PO)    Take 1 tablet by mouth.   FAMOTIDINE (PEPCID) 20 MG TABLET    1/2 tablet in the morning and 1 tablet at night   FISH OIL-OMEGA-3 FATTY ACIDS 1000 MG CAPSULE    Take 1 g by mouth daily. Not sure why she takes this. Not working for fatigue   LORAZEPAM (ATIVAN) 0.5 MG TABLET    Take 0.5 tablets (0.25 mg total) by mouth daily as needed for anxiety.   MULTIPLE VITAMIN (MULTIVITAMIN) TABLET    Take 1 tablet by mouth daily.   RA CALCIUM  HI-CAL PO    Take by mouth. Take 3 tablets 1 time daily   SIMVASTATIN (ZOCOR) 10 MG TABLET    Take one tablet by mouth once daily for cholesterol.   TDAP (BOOSTRIX) 5-2.5-18.5 LF-MCG/0.5 INJECTION    Inject 0.5 mLs into the muscle once.   VENLAFAXINE (EFFEXOR) 37.5 MG TABLET    Take one tablet by mouth twice daily   ZOLPIDEM (AMBIEN) 5 MG TABLET    Take 0.5 tablets (2.5 mg total) by mouth at bedtime as needed for sleep.   ZOSTER VACCINE ADJUVANTED [facility]) INJECTION    Inject 0.5 mLs into the muscle once.  Modified Medications    No medications on file  Discontinued Medications   CIMETIDINE (TAGAMET PO)    Take by mouth 2 (two) times daily.    Physical Exam:  Vitals:   04/26/19 1517 04/26/19 1556  BP: (!) 142/86 138/80  Pulse: 70   Temp: 98.5 F (36.9 C)   TempSrc: Oral   SpO2: 97%   Weight: 184 lb (83.5 kg)   Height: '5\' 8"'  (1.727 m)    Body mass index is 27.98 kg/m. Wt Readings from Last 3 Encounters:  04/26/19 184 lb (83.5 kg)  10/20/18 184 lb (83.5 kg)  09/29/18 184 lb (83.5 kg)    Physical Exam Vitals signs reviewed.  Constitutional:      General: She is not in acute distress.    Appearance: She is well-developed.  HENT:     Head: Normocephalic and atraumatic.     Right Ear: External ear normal.     Left Ear: External ear normal.     Mouth/Throat:     Pharynx: No oropharyngeal exudate.  Eyes:     General: No scleral icterus.    Pupils: Pupils are equal, round, and reactive to light.  Neck:     Musculoskeletal: Normal range of motion and neck supple.     Thyroid: No thyromegaly.     Vascular: No carotid bruit.     Trachea: No tracheal deviation.  Cardiovascular:     Rate and Rhythm: Normal rate and regular rhythm.     Heart sounds: No murmur. No friction rub. No gallop.   Pulmonary:     Effort: Pulmonary effort is normal. No respiratory distress.     Breath sounds: Normal breath sounds. No wheezing or rales.  Abdominal:     General: Bowel sounds are normal. There is no distension.     Palpations: Abdomen is soft. There is no hepatomegaly or mass.     Tenderness: There is no abdominal tenderness. There is no guarding or rebound.     Hernia: No hernia is present.  Musculoskeletal:        General: No deformity.  Skin:    General: Skin is warm and dry.     Findings: No rash.     Comments: Right ACW tattoo  Neurological:     Mental Status: She is alert and oriented to person, place, and time.     Deep Tendon Reflexes: Reflexes are normal and symmetric.  Psychiatric:         Behavior: Behavior normal.        Thought Content: Thought content normal.        Judgment: Judgment normal.     Labs reviewed: Basic Metabolic Panel: Recent Labs    09/29/18 1017 04/20/19 0841  NA 137 139  K 4.4 4.9  CL 103 105  CO2 27 27  GLUCOSE 84 95  BUN  12 11  CREATININE 0.78 0.80  CALCIUM 9.4 9.0  TSH 2.46  --    Liver Function Tests: Recent Labs    09/29/18 1017 04/20/19 0841  AST 17 14  ALT 15 14  BILITOT 0.4 0.3  PROT 6.7 6.3   No results for input(s): LIPASE, AMYLASE in the last 8760 hours. No results for input(s): AMMONIA in the last 8760 hours. CBC: Recent Labs    09/29/18 1017  WBC 5.1  NEUTROABS 2,764  HGB 13.2  HCT 39.2  MCV 91.2  PLT 234   Lipid Panel: Recent Labs    09/29/18 1017 04/20/19 0841  CHOL 251* 215*  HDL 74 71  LDLCALC 155* 121*  TRIG 108 122  CHOLHDL 3.4 3.0   TSH: Recent Labs    09/29/18 1017  TSH 2.46   A1C: No results found for: HGBA1C   Assessment/Plan 1. Mood disorder (HCC) Stable on Effexor and lorazepam as needed   2. Mixed hyperlipidemia -continues on zocor with diet modifications  - Lipid panel; Future - CMP with eGFR([facility]); Future  3. Insomnia, unspecified type Stable, continues on ambien as needed  4. Gastroesophageal reflux disease without esophagitis -stable, occasionally has breakthrough symptoms.  - famotidine (PEPCID) 20 MG tablet; 1/2 tablet in the morning and 1 tablet at night  Dispense: 45 tablet; Refill: 5 - CBC with Differential/Platelet; Future  Next appt: 6 months with lab work before  [employer]. [place], [facility] [phone]

## 2019-04-27 ENCOUNTER — Ambulatory Visit: Payer: Medicare Other | Admitting: Nurse Practitioner

## 2019-06-02 ENCOUNTER — Other Ambulatory Visit: Payer: Self-pay | Admitting: Nurse Practitioner

## 2019-06-02 DIAGNOSIS — F39 Unspecified mood [affective] disorder: Secondary | ICD-10-CM

## 2019-06-06 ENCOUNTER — Other Ambulatory Visit: Payer: Self-pay | Admitting: Nurse Practitioner

## 2019-06-06 DIAGNOSIS — G4700 Insomnia, unspecified: Secondary | ICD-10-CM

## 2019-06-06 NOTE — Telephone Encounter (Signed)
Last filled in Epic on 10/20/2018, RX request sent to Lauree Chandler, NP to review [place] Database and approval if necessary

## 2019-06-22 ENCOUNTER — Other Ambulatory Visit: Payer: Self-pay | Admitting: Nurse Practitioner

## 2019-07-05 DIAGNOSIS — L821 Other seborrheic keratosis: Secondary | ICD-10-CM | POA: Diagnosis not present

## 2019-07-05 DIAGNOSIS — D225 Melanocytic nevi of trunk: Secondary | ICD-10-CM | POA: Diagnosis not present

## 2019-07-05 DIAGNOSIS — L570 Actinic keratosis: Secondary | ICD-10-CM | POA: Diagnosis not present

## 2019-07-05 DIAGNOSIS — L814 Other melanin hyperpigmentation: Secondary | ICD-10-CM | POA: Diagnosis not present

## 2019-07-05 DIAGNOSIS — D1801 Hemangioma of skin and subcutaneous tissue: Secondary | ICD-10-CM | POA: Diagnosis not present

## 2019-10-19 ENCOUNTER — Ambulatory Visit (INDEPENDENT_AMBULATORY_CARE_PROVIDER_SITE_OTHER): Payer: Medicare Other | Admitting: Nurse Practitioner

## 2019-10-19 ENCOUNTER — Other Ambulatory Visit: Payer: Self-pay

## 2019-10-19 ENCOUNTER — Encounter: Payer: Self-pay | Admitting: Nurse Practitioner

## 2019-10-19 DIAGNOSIS — Z1159 Encounter for screening for other viral diseases: Secondary | ICD-10-CM

## 2019-10-19 DIAGNOSIS — G4700 Insomnia, unspecified: Secondary | ICD-10-CM | POA: Diagnosis not present

## 2019-10-19 DIAGNOSIS — Z0000 Encounter for general adult medical examination without abnormal findings: Secondary | ICD-10-CM | POA: Diagnosis not present

## 2019-10-19 DIAGNOSIS — F39 Unspecified mood [affective] disorder: Secondary | ICD-10-CM

## 2019-10-19 MED ORDER — ZOLPIDEM TARTRATE 5 MG PO TABS: ORAL_TABLET | ORAL | 0 refills | Status: DC

## 2019-10-19 MED ORDER — VENLAFAXINE HCL 37.5 MG PO TABS: ORAL_TABLET | ORAL | 1 refills | Status: DC

## 2019-10-19 NOTE — Progress Notes (Signed)
   This service is provided via telemedicine  No vital signs collected/recorded due to the encounter was a telemedicine visit.   Location of patient (ex: home, work):  Home  Patient consents to a telephone visit:  Yes  Location of the provider (ex: office, home):  [facility], Office   Name of any referring provider: N/A  Names of all persons participating in the telemedicine service and their role in the encounter:  S.Chrae B/CMA, Jessica Eubanks, NP, and Patient   Time spent on call: 10 min with medical assistant   

## 2019-10-19 NOTE — Progress Notes (Signed)
Subjective:   Theresa Pena is a 69 y.o. female who presents for Medicare Annual (Subsequent) preventive examination.  Review of Systems:   Cardiac Risk Factors include: advanced age (>26men, >52 women);dyslipidemia     Objective:     Vitals: There were no vitals taken for this visit.  There is no height or weight on file to calculate BMI.  Advanced Directives 10/19/2019 04/26/2019 09/29/2018 09/23/2017 01/28/2017 06/18/2016 01/30/2016  Does Patient Have a Medical Advance Directive? Yes Yes Yes Yes Yes Yes Yes  Type of Paramedic of [place];Living will [place];Living will [place];Living will [place];Living will [place];Living will [place];Living will [place];Living will  Does patient want to make changes to medical advance directive? No - Patient declined No - Patient declined No - Patient declined No - Patient declined - No - Patient declined No - Patient declined  Copy of [place] in Chart? Yes - validated most recent copy scanned in chart (See row information) Yes - validated most recent copy scanned in chart (See row information) No - copy requested No - copy requested No - copy requested No - copy requested No - copy requested    Tobacco Social History   Tobacco Use  Smoking Status Former Smoker  . Types: Cigarettes  . Quit date: 10/04/1990  . Years since quitting: 29.0  Smokeless Tobacco Never Used     Counseling given: Not Answered   Clinical Intake:  Pre-visit preparation completed: Yes  Pain : No/denies pain     BMI - recorded: 27.98 Nutritional Status: BMI 25 -29 Overweight Nutritional Risks: None Diabetes: No  How often do you need to have someone help you when you read instructions, pamphlets, or other written materials from your doctor or pharmacy?: 1 - Never What is the last grade level you  completed in school?: 2 years of college        Past Medical History:  Diagnosis Date  . Cancer ([place]) 2001   breast   . Cataracts, bilateral   . GERD (gastroesophageal reflux disease)   . Hyperlipidemia    Past Surgical History:  Procedure Laterality Date  . BREAST SURGERY    . RADIAL KERATOTOMY     Family History  Problem Relation Age of Onset  . Hyperlipidemia Mother   . Esophageal cancer Cousin 66  . Colon cancer Neg Hx   . Stomach cancer Neg Hx    Social History   Socioeconomic History  . Marital status: Divorced    Spouse name: Not on file  . Number of children: Not on file  . Years of education: Not on file  . Highest education level: Not on file  Occupational History  . Not on file  Tobacco Use  . Smoking status: Former Smoker    Types: Cigarettes    Quit date: 10/04/1990    Years since quitting: 29.0  . Smokeless tobacco: Never Used  Substance and Sexual Activity  . Alcohol use: Yes    Alcohol/week: 13.0 - 15.0 standard drinks    Types: 8 Glasses of wine, 5 - 7 Standard drinks or equivalent per week  . Drug use: No  . Sexual activity: Not on file  Other Topics Concern  . Not on file  Social History Narrative   Diet:      Do you drink/ eat things with caffeine? Coffee 2-3 cups /day  Marital status:  Divorced                             What year were you married ? 2004      Do you live in a house, apartment,assistred living, condo, trailer, etc.)? House      Is it one or more stories? 1      How many persons live in your home ? 1      Do you have any pets in your home ?(please list)  1 dog      Current or past profession: Residential cleaning      Do you exercise?                              Type & how often: Rarely      Do you have a living will? Yes      Do you have a DNR form?   No                    If not, do you want to discuss one? Yes      Do you have signed POA?HPOA forms?   Yes              If so, please bring to your          appointment      Social Determinants of Health   Financial Resource Strain:   . Difficulty of Paying Living Expenses: Not on file  Food Insecurity:   . Worried About Charity fundraiser in the Last Year: Not on file  . Ran Out of Food in the Last Year: Not on file  Transportation Needs:   . Lack of Transportation (Medical): Not on file  . Lack of Transportation (Non-Medical): Not on file  Physical Activity:   . Days of Exercise per Week: Not on file  . Minutes of Exercise per Session: Not on file  Stress:   . Feeling of Stress : Not on file  Social Connections:   . Frequency of Communication with Friends and Family: Not on file  . Frequency of Social Gatherings with Friends and Family: Not on file  . Attends Religious Services: Not on file  . Active Member of Clubs or Organizations: Not on file  . Attends Archivist Meetings: Not on file  . Marital Status: Not on file    Outpatient Encounter Medications as of 10/19/2019  Medication Sig  . cholecalciferol (VITAMIN D) 1000 UNITS tablet Take 1,000 Units by mouth daily. Patient unsure of dosage  . Cyanocobalamin (VITAMIN B 12 PO) Take 1 tablet by mouth.  . famotidine (PEPCID) 20 MG tablet Take 20 mg by mouth at bedtime.  . fish oil-omega-3 fatty acids 1000 MG capsule Take 1 g by mouth daily.   Marland Kitchen LORazepam (ATIVAN) 0.5 MG tablet Take 0.5 tablets (0.25 mg total) by mouth daily as needed for anxiety.  . Multiple Vitamin (MULTIVITAMIN) tablet Take 1 tablet by mouth daily.  Marland Kitchen RA CALCIUM HI-CAL PO Take by mouth. Take 3 tablets 1 time daily  . simvastatin (ZOCOR) 10 MG tablet TAKE 1 TABLET BY MOUTH ONCE DAILY FOR CHOLESTEROL.  . [DISCONTINUED] venlafaxine (EFFEXOR) 37.5 MG tablet TAKE 1 TABLET BY MOUTH TWICE DAILY  . [DISCONTINUED] zolpidem (AMBIEN) 5 MG tablet TAKE 1/2 TABLET(2.5 MG) BY MOUTH AT BEDTIME AS NEEDED FOR SLEEP  .  venlafaxine (EFFEXOR) 37.5 MG tablet TAKE 1 TABLET BY MOUTH TWICE DAILY  . zolpidem (AMBIEN) 5 MG  tablet TAKE 1/2 TABLET(2.5 MG) BY MOUTH AT BEDTIME AS NEEDED FOR SLEEP  . [DISCONTINUED] famotidine (PEPCID) 20 MG tablet 1/2 tablet in the morning and 1 tablet at night  . [DISCONTINUED] Tdap (BOOSTRIX) 5-2.5-18.5 LF-MCG/0.5 injection Inject 0.5 mLs into the muscle once.  . [DISCONTINUED] Zoster Vaccine Adjuvanted [facility]) injection Inject 0.5 mLs into the muscle once.   No facility-administered encounter medications on file as of 10/19/2019.    Activities of Daily Living In your present state of health, do you have any difficulty performing the following activities: 10/19/2019  Hearing? N  Vision? N  Difficulty concentrating or making decisions? N  Walking or climbing stairs? N  Dressing or bathing? N  Doing errands, shopping? N  Preparing Food and eating ? N  Using the Toilet? N  In the past six months, have you accidently leaked urine? Y  Do you have problems with loss of bowel control? N  Managing your Medications? N  Managing your Finances? N  Housekeeping or managing your Housekeeping? N  Some recent data might be hidden    Patient Care Team: Lauree Chandler, NP as PCP - General (Geriatric Medicine) Webb Laws, [place] as Referring Physician (Optometry) Druscilla Brownie, MD as Consulting Physician (Dermatology)    Assessment:   This is a routine wellness examination for [place].  Exercise Activities and Dietary recommendations Current Exercise Habits: The patient does not participate in regular exercise at present  Goals    . Weight (lb) < 180 lb (81.6 kg)     Pt would like to get her weight to 180 lbs       Fall Risk Fall Risk  10/19/2019 04/26/2019 10/20/2018 09/29/2018 09/23/2017  Falls in the past year? 0 0 1 0 No  Number falls in past yr: 0 0 0 - -  Injury with Fall? 0 0 0 - -   Is the patient's home free of loose throw rugs in walkways, pet beds, electrical cords, etc?   yes      Grab bars in the bathroom? yes      Handrails on the stairs?   yes       Adequate lighting?   yes  Timed Get Up and Go performed: na  Depression Screen PHQ 2/9 Scores 10/19/2019 09/29/2018 09/23/2017 09/23/2017  PHQ - 2 Score 0 0 1 1     Cognitive Function MMSE - Mini Mental State Exam 09/29/2018 09/23/2017 06/18/2016  Orientation to time 5 5 5   Orientation to Place 5 5 5   Registration 3 3 3   Attention/ Calculation 5 5 5   Recall 3 2 2   Language- name 2 objects 2 2 2   Language- repeat 1 1 1   Language- follow 3 step command 3 3 3   Language- read & follow direction 1 1 1   Write a sentence 1 1 1   Copy design 1 1 1   Total score 30 29 29      6CIT Screen 10/19/2019  What Year? 0 points  What month? 0 points  What time? 0 points  Count back from 20 0 points  Months in reverse 0 points  Repeat phrase 0 points  Total Score 0    Immunization History  Administered Date(s) Administered  . Influenza, High Dose Seasonal PF 08/18/2018  . Influenza,inj,Quad PF,6+ Mos 06/18/2016, 09/23/2017  . Influenza-Unspecified 09/01/2018  . Pneumococcal Conjugate-13 06/18/2016  . Pneumococcal Polysaccharide-23 09/23/2017  .  Zoster 06/05/2015    Qualifies for Shingles Vaccine?yes, recommend   Screening Tests Health Maintenance  Topic Date Due  . TETANUS/TDAP  06/12/1969  . INFLUENZA VACCINE  05/05/2019  . MAMMOGRAM  11/17/2020  . COLONOSCOPY  01/28/2022  . DEXA SCAN  Completed  . Hepatitis C Screening  Completed  . PNA vac Low Risk Adult  Completed    Cancer Screenings: Lung: Low Dose CT Chest recommended if Age 43-80 years, 30 pack-year currently smoking OR have quit w/in 15years. Patient does not qualify. Breast:  Up to date on Mammogram? Yes   Up to date of Bone Density/Dexa? Yes Colorectal: up to date  Additional Screenings: Hepatitis C Screening: yes     Plan:      I have personally reviewed and noted the following in the patient's chart:   . Medical and social history . Use of alcohol, tobacco or illicit drugs  . Current medications and  supplements . Functional ability and status . Nutritional status . Physical activity . Advanced directives . List of other physicians . Hospitalizations, surgeries, and ER visits in previous 12 months . Vitals . Screenings to include cognitive, depression, and falls . Referrals and appointments  In addition, I have reviewed and discussed with patient certain preventive protocols, quality metrics, and best practice recommendations. A written personalized care plan for preventive services as well as general preventive health recommendations were provided to patient.     Lauree Chandler, NP  10/19/2019

## 2019-10-19 NOTE — Patient Instructions (Signed)
Theresa Pena , Thank you for taking time to come for your Medicare Wellness Visit. I appreciate your ongoing commitment to your health goals. Please review the following plan we discussed and let me know if I can assist you in the future.   Screening recommendations/referrals: Colonoscopy up to date Mammogram up to date Bone Density up to date Recommended yearly ophthalmology/optometry visit for glaucoma screening and checkup Recommended yearly dental visit for hygiene and checkup  Vaccinations: Influenza vaccine- recommended- can get in office when you get lab work.  Pneumococcal vaccine up to date Tdap vaccine up- recommended- to get at your local pharmacy Shingles vaccine -recommended- to get at your local pharmacy People 13 and older can get a COVID-19 vaccination from [facility].   [facility] [place] in [place] by appointment only. This will be for anyone 38 and older. People can be vaccinated at this time only if they register in advance. That can be done online at [email or web address] or by calling [phone]. You do not have to be a resident of [facility] to be vaccinated.  The vaccinations will move to the [facility] eventually due to a need for more space.    The [facility]) is pleased to announce that COVID-19 vaccinations will be available to Phase 1B adults who are 82 years or older regardless of health status or living situation. Those in this group can make a vaccination appointment by calling [phone] and selecting Option 2 beginning Friday, January 8 at 8:00 AM. Appointments are required.     Advanced directives: please bring to office so we can have on file  Conditions/risks identified: none  Next appointment: 1 year.    Preventive Care 8 Years and Older, Female Preventive care refers to lifestyle choices and visits with your health care provider that can promote health  and wellness. What does preventive care include?  A yearly physical exam. This is also called an annual well check.  Dental exams once or twice a year.  Routine eye exams. Ask your health care provider how often you should have your eyes checked.  Personal lifestyle choices, including:  Daily care of your teeth and gums.  Regular physical activity.  Eating a healthy diet.  Avoiding tobacco and drug use.  Limiting alcohol use.  Practicing safe sex.  Taking low-dose aspirin every day.  Taking vitamin and mineral supplements as recommended by your health care provider. What happens during an annual well check? The services and screenings done by your health care provider during your annual well check will depend on your age, overall health, lifestyle risk factors, and family history of disease. Counseling  Your health care provider may ask you questions about your:  Alcohol use.  Tobacco use.  Drug use.  Emotional well-being.  Home and relationship well-being.  Sexual activity.  Eating habits.  History of falls.  Memory and ability to understand (cognition).  Work and work Statistician.  Reproductive health. Screening  You may have the following tests or measurements:  Height, weight, and BMI.  Blood pressure.  Lipid and cholesterol levels. These may be checked every 5 years, or more frequently if you are over 70 years old.  Skin check.  Lung cancer screening. You may have this screening every year starting at age 67 if you have a 30-pack-year history of smoking and currently smoke or have quit within the past 15 years.  Fecal occult blood test (FOBT) of the  stool. You may have this test every year starting at age 44.  Flexible sigmoidoscopy or colonoscopy. You may have a sigmoidoscopy every 5 years or a colonoscopy every 10 years starting at age 33.  Hepatitis C blood test.  Hepatitis B blood test.  Sexually transmitted disease (STD)  testing.  Diabetes screening. This is done by checking your blood sugar (glucose) after you have not eaten for a while (fasting). You may have this done every 1-3 years.  Bone density scan. This is done to screen for osteoporosis. You may have this done starting at age 33.  Mammogram. This may be done every 1-2 years. Talk to your health care provider about how often you should have regular mammograms. Talk with your health care provider about your test results, treatment options, and if necessary, the need for more tests. Vaccines  Your health care provider may recommend certain vaccines, such as:  Influenza vaccine. This is recommended every year.  Tetanus, diphtheria, and acellular pertussis (Tdap, Td) vaccine. You may need a Td booster every 10 years.  Zoster vaccine. You may need this after age 37.  Pneumococcal 13-valent conjugate (PCV13) vaccine. One dose is recommended after age 88.  Pneumococcal polysaccharide (PPSV23) vaccine. One dose is recommended after age 33. Talk to your health care provider about which screenings and vaccines you need and how often you need them. This information is not intended to replace advice given to you by your health care provider. Make sure you discuss any questions you have with your health care provider. Document Released: 10/17/2015 Document Revised: 06/09/2016 Document Reviewed: 07/22/2015 Elsevier Interactive Patient Education  2017 [place] Prevention in the Home Falls can cause injuries. They can happen to people of all ages. There are many things you can do to make your home safe and to help prevent falls. What can I do on the outside of my home?  Regularly fix the edges of walkways and driveways and fix any cracks.  Remove anything that might make you trip as you walk through a door, such as a raised step or threshold.  Trim any bushes or trees on the path to your home.  Use bright outdoor lighting.  Clear any walking  paths of anything that might make someone trip, such as rocks or tools.  Regularly check to see if handrails are loose or broken. Make sure that both sides of any steps have handrails.  Any raised decks and porches should have guardrails on the edges.  Have any leaves, snow, or ice cleared regularly.  Use sand or salt on walking paths during winter.  Clean up any spills in your garage right away. This includes oil or grease spills. What can I do in the bathroom?  Use night lights.  Install grab bars by the toilet and in the tub and shower. Do not use towel bars as grab bars.  Use non-skid mats or decals in the tub or shower.  If you need to sit down in the shower, use a plastic, non-slip stool.  Keep the floor dry. Clean up any water that spills on the floor as soon as it happens.  Remove soap buildup in the tub or shower regularly.  Attach bath mats securely with double-sided non-slip rug tape.  Do not have throw rugs and other things on the floor that can make you trip. What can I do in the bedroom?  Use night lights.  Make sure that you have a light by your bed  that is easy to reach.  Do not use any sheets or blankets that are too big for your bed. They should not hang down onto the floor.  Have a firm chair that has side arms. You can use this for support while you get dressed.  Do not have throw rugs and other things on the floor that can make you trip. What can I do in the kitchen?  Clean up any spills right away.  Avoid walking on wet floors.  Keep items that you use a lot in easy-to-reach places.  If you need to reach something above you, use a strong step stool that has a grab bar.  Keep electrical cords out of the way.  Do not use floor polish or wax that makes floors slippery. If you must use wax, use non-skid floor wax.  Do not have throw rugs and other things on the floor that can make you trip. What can I do with my stairs?  Do not leave any items  on the stairs.  Make sure that there are handrails on both sides of the stairs and use them. Fix handrails that are broken or loose. Make sure that handrails are as long as the stairways.  Check any carpeting to make sure that it is firmly attached to the stairs. Fix any carpet that is loose or worn.  Avoid having throw rugs at the top or bottom of the stairs. If you do have throw rugs, attach them to the floor with carpet tape.  Make sure that you have a light switch at the top of the stairs and the bottom of the stairs. If you do not have them, ask someone to add them for you. What else can I do to help prevent falls?  Wear shoes that:  Do not have high heels.  Have rubber bottoms.  Are comfortable and fit you well.  Are closed at the toe. Do not wear sandals.  If you use a stepladder:  Make sure that it is fully opened. Do not climb a closed stepladder.  Make sure that both sides of the stepladder are locked into place.  Ask someone to hold it for you, if possible.  Clearly mark and make sure that you can see:  Any grab bars or handrails.  First and last steps.  Where the edge of each step is.  Use tools that help you move around (mobility aids) if they are needed. These include:  Canes.  Walkers.  Scooters.  Crutches.  Turn on the lights when you go into a dark area. Replace any light bulbs as soon as they burn out.  Set up your furniture so you have a clear path. Avoid moving your furniture around.  If any of your floors are uneven, fix them.  If there are any pets around you, be aware of where they are.  Review your medicines with your doctor. Some medicines can make you feel dizzy. This can increase your chance of falling. Ask your doctor what other things that you can do to help prevent falls. This information is not intended to replace advice given to you by your health care provider. Make sure you discuss any questions you have with your health care  provider. Document Released: 07/17/2009 Document Revised: 02/26/2016 Document Reviewed: 10/25/2014 Elsevier Interactive Patient Education  2017 [employer].

## 2019-10-24 ENCOUNTER — Other Ambulatory Visit: Payer: Medicare Other

## 2019-10-24 ENCOUNTER — Other Ambulatory Visit: Payer: Self-pay | Admitting: Nurse Practitioner

## 2019-10-24 ENCOUNTER — Other Ambulatory Visit: Payer: Self-pay

## 2019-10-24 DIAGNOSIS — Z1159 Encounter for screening for other viral diseases: Secondary | ICD-10-CM

## 2019-10-24 DIAGNOSIS — E782 Mixed hyperlipidemia: Secondary | ICD-10-CM

## 2019-10-24 DIAGNOSIS — K219 Gastro-esophageal reflux disease without esophagitis: Secondary | ICD-10-CM

## 2019-10-25 LAB — CBC WITH DIFFERENTIAL/PLATELET
Absolute Monocytes: 423 cells/uL (ref 200–950)
Basophils Absolute: 51 cells/uL (ref 0–200)
Basophils Relative: 1.1 %
Eosinophils Absolute: 110 cells/uL (ref 15–500)
Eosinophils Relative: 2.4 %
HCT: 39.8 % (ref 35.0–45.0)
Hemoglobin: 13.4 g/dL (ref 11.7–15.5)
Lymphs Abs: 1546 cells/uL (ref 850–3900)
MCH: 30.9 pg (ref 27.0–33.0)
MCHC: 33.7 g/dL (ref 32.0–36.0)
MCV: 91.7 fL (ref 80.0–100.0)
MPV: 12.1 fL (ref 7.5–12.5)
Monocytes Relative: 9.2 %
Neutro Abs: 2470 cells/uL (ref 1500–7800)
Neutrophils Relative %: 53.7 %
Platelets: 234 10*3/uL (ref 140–400)
RBC: 4.34 10*6/uL (ref 3.80–5.10)
RDW: 12.7 % (ref 11.0–15.0)
Total Lymphocyte: 33.6 %
WBC: 4.6 10*3/uL (ref 3.8–10.8)

## 2019-10-25 LAB — COMPLETE METABOLIC PANEL WITH GFR
AG Ratio: 1.6 (calc) (ref 1.0–2.5)
ALT: 17 U/L (ref 6–29)
AST: 18 U/L (ref 10–35)
Albumin: 4 g/dL (ref 3.6–5.1)
Alkaline phosphatase (APISO): 45 U/L (ref 37–153)
BUN: 10 mg/dL (ref 7–25)
CO2: 29 mmol/L (ref 20–32)
Calcium: 9.2 mg/dL (ref 8.6–10.4)
Chloride: 103 mmol/L (ref 98–110)
Creat: 0.77 mg/dL (ref 0.50–0.99)
GFR, Est African American: 91 mL/min/{1.73_m2} (ref >=60)
GFR, Est Non African American: 79 mL/min/{1.73_m2} (ref >=60)
Globulin: 2.5 g/dL (calc) (ref 1.9–3.7)
Glucose, Bld: 97 mg/dL (ref 65–99)
Potassium: 4.5 mmol/L (ref 3.5–5.3)
Sodium: 138 mmol/L (ref 135–146)
Total Bilirubin: 0.6 mg/dL (ref 0.2–1.2)
Total Protein: 6.5 g/dL (ref 6.1–8.1)

## 2019-10-25 LAB — LIPID PANEL
Cholesterol: 224 mg/dL — ABNORMAL HIGH (ref <200)
HDL: 66 mg/dL (ref >=50)
LDL Cholesterol (Calc): 140 mg/dL (calc) — ABNORMAL HIGH
Non-HDL Cholesterol (Calc): 158 mg/dL (calc) — ABNORMAL HIGH (ref <130)
Total CHOL/HDL Ratio: 3.4 (calc) (ref <5.0)
Triglycerides: 83 mg/dL (ref <150)

## 2019-10-25 LAB — HEPATITIS C ANTIBODY
Hepatitis C Ab: NONREACTIVE
SIGNAL TO CUT-OFF: 0.12 (ref <1.00)

## 2019-11-09 ENCOUNTER — Telehealth: Payer: Self-pay

## 2019-11-09 ENCOUNTER — Encounter: Payer: Self-pay | Admitting: Nurse Practitioner

## 2019-11-09 ENCOUNTER — Other Ambulatory Visit: Payer: Self-pay

## 2019-11-09 ENCOUNTER — Ambulatory Visit (INDEPENDENT_AMBULATORY_CARE_PROVIDER_SITE_OTHER): Payer: Medicare Other | Admitting: Nurse Practitioner

## 2019-11-09 VITALS — BP 126/74 | HR 73 | Temp 97.1°F | Ht 68.0 in | Wt 185.0 lb

## 2019-11-09 DIAGNOSIS — Z853 Personal history of malignant neoplasm of breast: Secondary | ICD-10-CM | POA: Diagnosis not present

## 2019-11-09 DIAGNOSIS — E782 Mixed hyperlipidemia: Secondary | ICD-10-CM

## 2019-11-09 DIAGNOSIS — G4700 Insomnia, unspecified: Secondary | ICD-10-CM

## 2019-11-09 DIAGNOSIS — K219 Gastro-esophageal reflux disease without esophagitis: Secondary | ICD-10-CM

## 2019-11-09 DIAGNOSIS — F39 Unspecified mood [affective] disorder: Secondary | ICD-10-CM

## 2019-11-09 MED ORDER — ROSUVASTATIN CALCIUM 5 MG PO TABS: ORAL_TABLET | ORAL | 1 refills | Status: DC

## 2019-11-09 NOTE — Patient Instructions (Addendum)
STOP zocor, start crestor every other day   Lab work to follow up cholesterol in 3 months  6 months for routine follow up

## 2019-11-09 NOTE — Progress Notes (Signed)
Careteam: Patient Care Team: Theresa Chandler, NP as PCP - General (Geriatric Medicine) Theresa Pena, [facility] as Referring Physician (Optometry) Theresa Brownie, MD as Consulting Physician (Dermatology)  Advanced Directive information Does Patient Have a Medical Advance Directive?: Yes, Type of Advance Directive: [place], Does patient want to make changes to medical advance directive?: No - Patient declined  Allergies  Allergen Reactions  . Codeine Nausea Only    Also has dizziness  . Omeprazole Other (See Comments)    GI upset     Chief Complaint  Patient presents with  . Medical Management of Chronic Issues    6 month follow-up   . Advanced Directive    Review Durable POA      HPI: Patient is a 69 y.o. female for routine follow up.  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 Tired from working all week, thinking about cutting back.   Mood has been stable on effexor, rarely uses ativan, used a half over a year ago.   Hyperlipidemia- LDL worse at 140, total cholesterol 224 with HDL 66- taking medication 4 days out of 7, not eating as well due to COVID. More fast food due to COVID.   GERD- controlled on Pepcid, followed with GI due to hiatal hernia. inherent stomach issues from both sides of her family  Insomnia- does not sleep well if she is really fatigued, will use 1/4-1/2 tablet if needed, may take twice a week and may go weeks without it. Likes to have it on hand if she needs it, wakes up feeling good when she does take.   COVID vaccine today    Review of Systems:  Review of Systems  Constitutional: Negative for chills, fever and weight loss.  HENT: Negative for tinnitus.   Respiratory: Negative for cough, sputum production and shortness of breath.   Cardiovascular: Negative for chest pain, palpitations and leg swelling.  Gastrointestinal:  Negative for abdominal pain, constipation, diarrhea and heartburn.  Genitourinary: Negative for dysuria, frequency and urgency.  Musculoskeletal: Negative for back pain, falls, joint pain and myalgias.  Skin: Negative.   Neurological: Negative for dizziness and headaches.  Psychiatric/Behavioral: Negative for depression and memory loss. The patient does not have insomnia.     Past Medical History:  Diagnosis Date  . Cancer ([place]) 2001   breast   . Cataracts, bilateral   . GERD (gastroesophageal reflux disease)   . Hyperlipidemia    Past Surgical History:  Procedure Laterality Date  . BREAST SURGERY    . RADIAL KERATOTOMY     Social History:   reports that she quit smoking about 29 years ago. Her smoking use included cigarettes. She has never used smokeless tobacco. She reports current alcohol use of about 13.0 - 15.0 standard drinks of alcohol per week. She reports that she does not use drugs.  Family History  Problem Relation Age of Onset  . Hyperlipidemia Mother   . Esophageal cancer Cousin 21  . Colon cancer Neg Hx   . Stomach cancer Neg Hx     Medications: Patient's Medications  New Prescriptions   No medications on file  Previous Medications   CHOLECALCIFEROL (VITAMIN D3 PO)    Take 500 Units by mouth daily.   CYANOCOBALAMIN (VITAMIN B 12 PO)    Take 1 tablet by mouth.   FAMOTIDINE (PEPCID) 20 MG TABLET  Take 20 mg by mouth at bedtime.   FISH OIL-OMEGA-3 FATTY ACIDS 1000 MG CAPSULE    Take 1 g by mouth daily.    MULTIPLE VITAMIN (MULTIVITAMIN) TABLET    Take 1 tablet by mouth daily.   RA CALCIUM HI-CAL PO    Take 3 tablets 1 time daily    SIMVASTATIN (ZOCOR) 10 MG TABLET    TAKE 1 TABLET BY MOUTH ONCE DAILY FOR CHOLESTEROL.   VENLAFAXINE (EFFEXOR) 37.5 MG TABLET    TAKE 1 TABLET BY MOUTH TWICE DAILY   ZOLPIDEM (AMBIEN) 5 MG TABLET    TAKE 1/2 TABLET(2.5 MG) BY MOUTH AT BEDTIME AS NEEDED FOR SLEEP  Modified Medications   No medications on file  Discontinued  Medications   CHOLECALCIFEROL (VITAMIN D) 1000 UNITS TABLET    Take 1,000 Units by mouth daily. Patient unsure of dosage   LORAZEPAM (ATIVAN) 0.5 MG TABLET    Take 0.5 tablets (0.25 mg total) by mouth daily as needed for anxiety.    Physical Exam:  Vitals:   11/09/19 1105  BP: 126/74  Pulse: 73  Temp: (!) 97.1 F (36.2 C)  TempSrc: Temporal  SpO2: 98%  Weight: 185 lb (83.9 kg)  Height: 5\' 8"  (1.727 m)   Body mass index is 28.13 kg/m. Wt Readings from Last 3 Encounters:  11/09/19 185 lb (83.9 kg)  04/26/19 184 lb (83.5 kg)  10/20/18 184 lb (83.5 kg)    Physical Exam Constitutional:      General: She is not in acute distress.    Appearance: She is well-developed. She is not diaphoretic.  HENT:     Head: Normocephalic and atraumatic.     Mouth/Throat:     Pharynx: No oropharyngeal exudate.  Eyes:     Conjunctiva/sclera: Conjunctivae normal.     Pupils: Pupils are equal, round, and reactive to light.  Cardiovascular:     Rate and Rhythm: Normal rate and regular rhythm.     Heart sounds: Normal heart sounds.  Pulmonary:     Effort: Pulmonary effort is normal.     Breath sounds: Normal breath sounds.  Abdominal:     General: Bowel sounds are normal.     Palpations: Abdomen is soft.  Musculoskeletal:        General: No tenderness.     Cervical back: Normal range of motion and neck supple.  Skin:    General: Skin is warm and dry.  Neurological:     Mental Status: She is alert and oriented to person, place, and time.    Labs reviewed: Basic Metabolic Panel: Recent Labs    04/20/19 0841 10/24/19 0835  NA 139 138  K 4.9 4.5  CL 105 103  CO2 27 29  GLUCOSE 95 97  BUN 11 10  CREATININE 0.80 0.77  CALCIUM 9.0 9.2   Liver Function Tests: Recent Labs    04/20/19 0841 10/24/19 0835  AST 14 18  ALT 14 17  BILITOT 0.3 0.6  PROT 6.3 6.5   No results for input(s): LIPASE, AMYLASE in the last 8760 hours. No results for input(s): AMMONIA in the last 8760  hours. CBC: Recent Labs    10/24/19 0835  WBC 4.6  NEUTROABS 2,470  HGB 13.4  HCT 39.8  MCV 91.7  PLT 234   Lipid Panel: Recent Labs    04/20/19 0841 10/24/19 0835  CHOL 215* 224*  HDL 71 66  LDLCALC 121* 140*  TRIG 122 83  CHOLHDL 3.0 3.4  TSH: No results for input(s): TSH in the last 8760 hours. A1C: No results found for: HGBA1C   Assessment/Plan 1. Mixed hyperlipidemia LDL not at goal, does not remember to take medication every day, will change to crestor for better LDL reduction, to stop zocor - rosuvastatin (CRESTOR) 5 MG tablet; Every other day  Dispense: 45 tablet; Refill: 1  2. Mood disorder ([place]) -stable on effexor, does not need ativan in over a year. Will DC from medication list.   3. Insomnia, unspecified type -stable, occasionally needs Ambien, uses 1/4-12 tablet with good results   4. History of breast cancer in female No further oncology follow up but continues with yearly mammograms, plans to schedule at this time.  5. Gastroesophageal reflux disease without esophagitis Controlled on famotidine daily   Next appt: 3 month for cmp and lipid Follow up in 6 months Katieann Hungate K. [place], [facility] [phone]

## 2019-11-09 NOTE — Telephone Encounter (Signed)
Theresa Pena (sister of [name]) was in here 1st of week because of fall, was not prescribed anything for the fall.   She has tylenol and  took an extra one yesterday, however it didn't help. She would like for you to increase the tylenol dose or give her something stronger for the pain

## 2019-11-09 NOTE — Telephone Encounter (Signed)
Needs to be placed in [facility] Chart

## 2019-12-01 ENCOUNTER — Other Ambulatory Visit: Payer: Self-pay | Admitting: Nurse Practitioner

## 2019-12-17 ENCOUNTER — Other Ambulatory Visit: Payer: Self-pay | Admitting: Nurse Practitioner

## 2019-12-18 NOTE — Telephone Encounter (Signed)
Refill refused, pt taking crestor

## 2020-02-04 ENCOUNTER — Other Ambulatory Visit: Payer: Medicare Other

## 2020-02-04 ENCOUNTER — Other Ambulatory Visit: Payer: Self-pay

## 2020-02-05 LAB — LIPID PANEL
Cholesterol: 200 mg/dL — ABNORMAL HIGH (ref <200)
HDL: 48 mg/dL — ABNORMAL LOW (ref >=50)
LDL Cholesterol (Calc): 113 mg/dL (calc) — ABNORMAL HIGH
Non-HDL Cholesterol (Calc): 152 mg/dL (calc) — ABNORMAL HIGH (ref <130)
Total CHOL/HDL Ratio: 4.2 (calc) (ref <5.0)
Triglycerides: 270 mg/dL — ABNORMAL HIGH (ref <150)

## 2020-02-05 LAB — COMPLETE METABOLIC PANEL WITH GFR
AG Ratio: 1.6 (calc) (ref 1.0–2.5)
ALT: 15 U/L (ref 6–29)
AST: 14 U/L (ref 10–35)
Albumin: 4.1 g/dL (ref 3.6–5.1)
Alkaline phosphatase (APISO): 50 U/L (ref 37–153)
BUN: 15 mg/dL (ref 7–25)
CO2: 25 mmol/L (ref 20–32)
Calcium: 9.7 mg/dL (ref 8.6–10.4)
Chloride: 103 mmol/L (ref 98–110)
Creat: 0.7 mg/dL (ref 0.50–0.99)
GFR, Est African American: 102 mL/min/{1.73_m2} (ref >=60)
GFR, Est Non African American: 88 mL/min/{1.73_m2} (ref >=60)
Globulin: 2.5 g/dL (calc) (ref 1.9–3.7)
Glucose, Bld: 98 mg/dL (ref 65–99)
Potassium: 4.3 mmol/L (ref 3.5–5.3)
Sodium: 136 mmol/L (ref 135–146)
Total Bilirubin: 0.3 mg/dL (ref 0.2–1.2)
Total Protein: 6.6 g/dL (ref 6.1–8.1)

## 2020-02-05 LAB — CBC WITH DIFFERENTIAL/PLATELET
Absolute Monocytes: 515 cells/uL (ref 200–950)
Basophils Absolute: 53 cells/uL (ref 0–200)
Basophils Relative: 0.8 %
Eosinophils Absolute: 79 cells/uL (ref 15–500)
Eosinophils Relative: 1.2 %
HCT: 40.9 % (ref 35.0–45.0)
Hemoglobin: 13.5 g/dL (ref 11.7–15.5)
Lymphs Abs: 1993 cells/uL (ref 850–3900)
MCH: 30.8 pg (ref 27.0–33.0)
MCHC: 33 g/dL (ref 32.0–36.0)
MCV: 93.4 fL (ref 80.0–100.0)
MPV: 12.2 fL (ref 7.5–12.5)
Monocytes Relative: 7.8 %
Neutro Abs: 3960 cells/uL (ref 1500–7800)
Neutrophils Relative %: 60 %
Platelets: 243 10*3/uL (ref 140–400)
RBC: 4.38 10*6/uL (ref 3.80–5.10)
RDW: 13.1 % (ref 11.0–15.0)
Total Lymphocyte: 30.2 %
WBC: 6.6 10*3/uL (ref 3.8–10.8)

## 2020-02-05 LAB — HEPATITIS C ANTIBODY
Hepatitis C Ab: NONREACTIVE
SIGNAL TO CUT-OFF: 0.09 (ref <1.00)

## 2020-02-14 ENCOUNTER — Encounter: Payer: Self-pay | Admitting: Nurse Practitioner

## 2020-02-27 ENCOUNTER — Other Ambulatory Visit: Payer: Self-pay | Admitting: *Deleted

## 2020-02-27 DIAGNOSIS — F39 Unspecified mood [affective] disorder: Secondary | ICD-10-CM

## 2020-02-27 MED ORDER — VENLAFAXINE HCL 37.5 MG PO TABS: ORAL_TABLET | ORAL | 1 refills | Status: DC

## 2020-02-27 NOTE — Telephone Encounter (Signed)
[place]

## 2020-04-04 ENCOUNTER — Ambulatory Visit (INDEPENDENT_AMBULATORY_CARE_PROVIDER_SITE_OTHER): Payer: Medicare Other | Admitting: Family

## 2020-04-04 ENCOUNTER — Encounter: Payer: Self-pay | Admitting: Family

## 2020-04-04 ENCOUNTER — Other Ambulatory Visit: Payer: Self-pay

## 2020-04-04 VITALS — BP 136/90 | HR 71 | Temp 97.8°F | Resp 16 | Ht 68.0 in | Wt 178.2 lb

## 2020-04-04 DIAGNOSIS — R202 Paresthesia of skin: Secondary | ICD-10-CM

## 2020-04-04 DIAGNOSIS — R200 Anesthesia of skin: Secondary | ICD-10-CM | POA: Diagnosis not present

## 2020-04-04 DIAGNOSIS — M79602 Pain in left arm: Secondary | ICD-10-CM

## 2020-04-04 NOTE — Patient Instructions (Signed)
-   Please get left shoulder X-ray done at [facility] [address] at [place].Will call you with results. - Continue with current pain medication. Notify if symptom worsen

## 2020-04-04 NOTE — Progress Notes (Signed)
Provider: Judye Lorino FNP-C  Lauree Chandler, NP  Patient Care Team: Lauree Chandler, NP as PCP - General (Geriatric Medicine) Webb Laws, [place] as Referring Physician (Optometry) Druscilla Brownie, MD as Consulting Physician (Dermatology)  Extended Emergency Contact Information Primary Emergency Contact: Prinsen,Carolyn Address: [address]          [place], [place] [address] Johnnette Litter of [place] Phone: [phone] Work Phone: [phone] Mobile Phone: [phone] Relation: Sister  Code Status:  Full Code  Goals of care: Advanced Directive information Advanced Directives 04/04/2020  Does Patient Have a Medical Advance Directive? Yes  Type of Advance Directive Living will;Healthcare Power of Attorney  Does patient want to make changes to medical advance directive? -  Copy of [place] in Chart? No - copy requested  Would patient like information on creating a medical advance directive? No - Patient declined     Chief Complaint  Patient presents with  . Acute Visit    Left arm pain x months.    HPI:  Pt is a 69 y.o. female seen today for an acute visit for evaluation of Left arm pain x several months.Lately it's been tingling on fingers.she been unable to comb hair and reach the back due pain.sometimes it hurt on the back of the upper arm.Has been using the left hand when walking the dog. Has taken aleve with relief.No previous injury to shoulder or arm.No weakness of hand.denies any fever, chills,swelling or redness.     Past Medical History:  Diagnosis Date  . Cancer ([place]) 2001   breast   . Cataracts, bilateral   . GERD (gastroesophageal reflux disease)   . Hyperlipidemia    Past Surgical History:  Procedure Laterality Date  . BREAST SURGERY    . RADIAL KERATOTOMY      Allergies  Allergen Reactions  . Codeine Nausea Only    Also has dizziness  . Omeprazole Other (See Comments)    GI upset     Outpatient  Encounter Medications as of 04/04/2020  Medication Sig  . Cholecalciferol (VITAMIN D3 PO) Take 500 Units by mouth daily.  . Cyanocobalamin (VITAMIN B 12 PO) Take 1 tablet by mouth.  . famotidine (PEPCID) 20 MG tablet Take 20 mg by mouth at bedtime.  . fish oil-omega-3 fatty acids 1000 MG capsule Take 1 g by mouth daily.   . Multiple Vitamin (MULTIVITAMIN) tablet Take 1 tablet by mouth daily.  Marland Kitchen RA CALCIUM HI-CAL PO Take 3 tablets 1 time daily   . rosuvastatin (CRESTOR) 5 MG tablet Every other day  . venlafaxine (EFFEXOR) 37.5 MG tablet TAKE 1 TABLET BY MOUTH TWICE DAILY  . zolpidem (AMBIEN) 5 MG tablet TAKE 1/2 TABLET(2.5 MG) BY MOUTH AT BEDTIME AS NEEDED FOR SLEEP   No facility-administered encounter medications on file as of 04/04/2020.    Review of Systems  Constitutional: Negative for appetite change, chills and fatigue.  Respiratory: Negative for cough, chest tightness, shortness of breath and wheezing.   Cardiovascular: Negative for chest pain, palpitations and leg swelling.  Musculoskeletal: Negative for arthralgias, back pain and gait problem.  Skin: Negative for color change, pallor and rash.  Neurological: Positive for numbness. Negative for dizziness, seizures, speech difficulty, weakness and headaches.  Hematological: Does not bruise/bleed easily.    Immunization History  Administered Date(s) Administered  . Influenza, High Dose Seasonal PF 08/18/2018  . Influenza,inj,Quad PF,6+ Mos 06/18/2016, 09/23/2017  . Influenza-Unspecified 09/01/2018  . Moderna SARS-COVID-2 Vaccination 11/09/2019, 12/12/2019  . Pneumococcal  Conjugate-13 06/18/2016  . Pneumococcal Polysaccharide-23 09/23/2017  . Zoster 06/05/2015   Pertinent  Health Maintenance Due  Topic Date Due  . INFLUENZA VACCINE  05/04/2020  . MAMMOGRAM  11/17/2020  . COLONOSCOPY  01/28/2022  . DEXA SCAN  Completed  . PNA vac Low Risk Adult  Completed   Fall Risk  04/04/2020 11/09/2019 10/19/2019 04/26/2019 10/20/2018  Falls  in the past year? 0 0 0 0 1  Number falls in past yr: 0 0 0 0 0  Injury with Fall? 0 0 0 0 0    Vitals:   04/04/20 0931  BP: 136/90  Pulse: 71  Resp: 16  Temp: 97.8 F (36.6 C)  SpO2: 98%  Weight: 178 lb 3.2 oz (80.8 kg)  Height: 5\' 8"  (1.727 m)   Body mass index is 27.1 kg/m. Physical Exam Vitals reviewed.  Constitutional:      General: She is not in acute distress.    Appearance: She is not ill-appearing.  Eyes:     General: No scleral icterus.       Right eye: No discharge.        Left eye: No discharge.     Conjunctiva/sclera: Conjunctivae normal.     Pupils: Pupils are equal, round, and reactive to light.  Neck:     Vascular: No carotid bruit.  Cardiovascular:     Rate and Rhythm: Normal rate and regular rhythm.     Pulses: Normal pulses.     Heart sounds: Normal heart sounds. No murmur heard.  No friction rub. No gallop.   Pulmonary:     Effort: Pulmonary effort is normal. No respiratory distress.     Breath sounds: Normal breath sounds. No wheezing, rhonchi or rales.  Chest:     Chest wall: No tenderness.  Musculoskeletal:        General: No swelling or tenderness.     Right shoulder: Normal.     Left shoulder: No swelling, effusion, tenderness or crepitus. Decreased range of motion. Normal strength. Normal pulse.     Cervical back: No rigidity or tenderness.     Right lower leg: No edema.     Left lower leg: No edema.     Comments: Left shoulder limited ROM with overhead reach and internal rotation.  Lymphadenopathy:     Cervical: No cervical adenopathy.  Skin:    General: Skin is warm.     Coloration: Skin is not pale.     Findings: No bruising, erythema, lesion or rash.  Neurological:     Mental Status: She is alert and oriented to person, place, and time.     Cranial Nerves: No cranial nerve deficit.     Sensory: No sensory deficit.     Motor: No weakness.     Gait: Gait normal.  Psychiatric:        Mood and Affect: Mood normal.         Behavior: Behavior normal.        Thought Content: Thought content normal.        Judgment: Judgment normal.    Labs reviewed: Recent Labs    04/20/19 0841 10/24/19 0835 02/04/20 0836  NA 139 138 136  K 4.9 4.5 4.3  CL 105 103 103  CO2 27 29 25   GLUCOSE 95 97 98  BUN 11 10 15   CREATININE 0.80 0.77 0.70  CALCIUM 9.0 9.2 9.7   Recent Labs    04/20/19 0841 10/24/19 0835 02/04/20 0836  AST 14  18 14  ALT 14 17 15   BILITOT 0.3 0.6 0.3  PROT 6.3 6.5 6.6   Recent Labs    10/24/19 0835 02/04/20 0836  WBC 4.6 6.6  NEUTROABS 2,470 3,960  HGB 13.4 13.5  HCT 39.8 40.9  MCV 91.7 93.4  PLT 234 243   Lab Results  Component Value Date   TSH 2.46 09/29/2018   No results found for: HGBA1C Lab Results  Component Value Date   CHOL 200 (H) 02/04/2020   HDL 48 (L) 02/04/2020   LDLCALC 113 (H) 02/04/2020   TRIG 270 (H) 02/04/2020   CHOLHDL 4.2 02/04/2020    Significant Diagnostic Results in last 30 days:  No results found.  Assessment/Plan 1. Left arm pain Worst with overhead and internal rotation. Ongoing for several months but has worsen lately with numbness and tingling.Will obtain imaging then send to Orthopedic if needed. - continue current OTC pain regimen.  - DG Shoulder Left; Future - Advised to notify provider if symptoms worsen.   2. Numbness and tingling of left hand Worsening lately.Imaging as above.suspect possible pinched Nerve verse overuse walking the dog.  - DG Shoulder Left; Future  Family/ staff Communication: Reviewed plan of care with patient verbalized understanding.  Labs/tests ordered: - DG Shoulder Left; Future  Next Appointment: As needed if symptoms worsen or fail to improve.  Sandrea Hughs, NP

## 2020-04-07 ENCOUNTER — Other Ambulatory Visit: Payer: Self-pay | Admitting: Nurse Practitioner

## 2020-04-07 DIAGNOSIS — G4700 Insomnia, unspecified: Secondary | ICD-10-CM

## 2020-04-08 NOTE — Telephone Encounter (Signed)
R last filled in Epic on 10/19/2019  Treatment agreement on file from 10/19/2019

## 2020-04-10 ENCOUNTER — Other Ambulatory Visit: Payer: Self-pay

## 2020-04-10 ENCOUNTER — Ambulatory Visit
Admission: RE | Admit: 2020-04-10 | Discharge: 2020-04-10 | Disposition: A | Discharge status: Home / Self Care | Payer: Medicare Other | Source: Ambulatory Visit | Attending: Family | Admitting: Family

## 2020-04-10 DIAGNOSIS — M79602 Pain in left arm: Secondary | ICD-10-CM

## 2020-04-10 DIAGNOSIS — R200 Anesthesia of skin: Secondary | ICD-10-CM

## 2020-04-17 ENCOUNTER — Other Ambulatory Visit: Payer: Self-pay | Admitting: Family

## 2020-04-17 DIAGNOSIS — M79602 Pain in left arm: Secondary | ICD-10-CM

## 2020-04-17 DIAGNOSIS — R200 Anesthesia of skin: Secondary | ICD-10-CM

## 2020-05-01 ENCOUNTER — Encounter: Payer: Self-pay | Admitting: Orthopedic Surgery

## 2020-05-01 ENCOUNTER — Ambulatory Visit: Payer: Medicare Other | Admitting: Orthopedic Surgery

## 2020-05-01 DIAGNOSIS — M7502 Adhesive capsulitis of left shoulder: Secondary | ICD-10-CM

## 2020-05-01 NOTE — Progress Notes (Signed)
Office Visit Note   Patient: Theresa Pena           Date of Birth: [birth year]           MRN: [id number] Visit Date: 05/01/2020 Requested by: Sandrea Hughs, NP [address] [place],  [place] [address] PCP: Lauree Chandler, NP  Subjective: Chief Complaint  Patient presents with  . Left Shoulder - Pain    HPI: Theresa Pena is a 69 y.o. female who presents to the office complaining of left arm pain.  Patient notes pain that she localizes to the distal upper arm over the last several months.  She denies any injury and onset of pain though she does note that several months ago she was walking her dog when the dog yanked her left arm acutely.  She states that it is difficult to lift her arm and she feels that her arm is stiff compared with the contralateral arm.  Resting the shoulder and arm has not helped.  Is difficult to put her bra on in the morning.  She has no history of diabetes or thyroid disorders.  She works [employer] for living.  She notes that her arm feels better when she uses it rather than rests it.  She does note some numbness and tingling in the arm that starts on the radial side of the elbow and travels down into the hand multiple times per day.  She denies any neck pain.  She does wake with pain on occasion from this arm pain.  She had x-rays of her shoulder that were ordered by her primary care physician that were negative for anything aside from some mild glenohumeral/acromioclavicular joint arthritis..                ROS: All systems reviewed are negative as they relate to the chief complaint within the history of present illness.  Patient denies fevers or chills.  Assessment & Plan: Visit Diagnoses:  1. Adhesive capsulitis of left shoulder     Plan: Patient is a 69 year old female who presents complaining of several months of left arm pain.  She has had x-rays of her shoulder that have been negative for any pathology to explain her symptoms.  She does have  significant stiffness of the left shoulder on exam today.  No weakness or loss of strength.  She has been taking Aleve with little relief.  Impression is adhesive capsulitis of the left shoulder.  Left glenohumeral joint injection administered today.  Home exercise program with shoulder range of motion exercises were detailed to the patient.  She will do these every day over the next 6 weeks.  Follow-up in 6 weeks for clinical recheck.  Consider repeat injection at that time versus formal physical therapy.  Patient agreed with plan.  Follow-Up Instructions: No follow-ups on file.   Orders:  No orders of the defined types were placed in this encounter.  No orders of the defined types were placed in this encounter.     Procedures: Large Joint Inj: L glenohumeral on 05/02/2020 10:33 PM Indications: diagnostic evaluation and pain Details: 18 G 1.5 in needle, posterior approach  Arthrogram: No  Medications: 9 mL bupivacaine 0.5 %; 40 mg methylPREDNISolone acetate 40 MG/ML; 5 mL lidocaine 1 % Outcome: tolerated well, no immediate complications Procedure, treatment alternatives, risks and benefits explained, specific risks discussed. Consent was given by the patient. Immediately prior to procedure a time out was called to verify the correct patient, procedure, equipment,  support staff and site/side marked as required. Patient was prepped and draped in the usual sterile fashion.       Clinical Data: No additional findings.  Objective: Vital Signs: There were no vitals taken for this visit.  Physical Exam:  Constitutional: Patient appears well-developed HEENT:  Head: Normocephalic Eyes:EOM are normal Neck: Normal range of motion Cardiovascular: Normal rate Pulmonary/chest: Effort normal Neurologic: Patient is alert Skin: Skin is warm Psychiatric: Patient has normal mood and affect  Ortho Exam: Ortho exam demonstrates left shoulder with range of motion: 20 degrees external rotation,  70 degrees abduction, 90 degrees forward flexion.  This is compared with her contralateral arm which demonstrated 40 degrees external rotation, 100 degrees abduction, 120 degrees forward flexion.  Excellent strength of the rotator cuff muscles on exam.  No tenderness palpation throughout the axial cervical spine.  Mild tenderness palpation over the bicipital groove.  No tenderness over the [facility] joint.  No crepitus is felt with passive range of motion of the shoulder.  No tenderness over the left elbow.  Sensation intact throughout the left hand.  Specialty Comments:  No specialty comments available.  Imaging: No results found.   PMFS History: Patient Active Problem List   Diagnosis Date Noted  . Mixed hyperlipidemia 09/23/2017  . High risk medication use 09/23/2017  . Insomnia 09/23/2017  . History of breast cancer in female 03/14/2015  . Mood disorder ([place]) 02/24/2012   Past Medical History:  Diagnosis Date  . Cancer ([place]) 2001   breast   . Cataracts, bilateral   . GERD (gastroesophageal reflux disease)   . Hyperlipidemia     Family History  Problem Relation Age of Onset  . Hyperlipidemia Mother   . Esophageal cancer Cousin 70  . Colon cancer Neg Hx   . Stomach cancer Neg Hx     Past Surgical History:  Procedure Laterality Date  . BREAST SURGERY    . RADIAL KERATOTOMY     Social History   Occupational History  . Not on file  Tobacco Use  . Smoking status: Former Smoker    Types: Cigarettes    Quit date: 10/04/1990    Years since quitting: 29.5  . Smokeless tobacco: Never Used  Vaping Use  . Vaping Use: Never used  Substance and Sexual Activity  . Alcohol use: Yes    Alcohol/week: 13.0 - 15.0 standard drinks    Types: 8 Glasses of wine, 5 - 7 Standard drinks or equivalent per week  . Drug use: No  . Sexual activity: Not on file

## 2020-05-02 ENCOUNTER — Encounter: Payer: Self-pay | Admitting: Orthopedic Surgery

## 2020-05-02 DIAGNOSIS — M7502 Adhesive capsulitis of left shoulder: Secondary | ICD-10-CM

## 2020-05-02 MED ORDER — METHYLPREDNISOLONE ACETATE 40 MG/ML IJ SUSP
40.0000 mg | INTRAMUSCULAR | Status: AC | PRN
  Administered 2020-05-02: 40 mg via INTRA_ARTICULAR

## 2020-05-02 MED ORDER — BUPIVACAINE HCL 0.5 % IJ SOLN
9.0000 mL | INTRAMUSCULAR | Status: AC | PRN
  Administered 2020-05-02: 9 mL via INTRA_ARTICULAR

## 2020-05-02 MED ORDER — LIDOCAINE HCL 1 % IJ SOLN
5.0000 mL | INTRAMUSCULAR | Status: AC | PRN
  Administered 2020-05-02: 5 mL

## 2020-05-09 ENCOUNTER — Ambulatory Visit: Payer: Medicare Other | Admitting: Nurse Practitioner

## 2020-05-14 ENCOUNTER — Ambulatory Visit: Payer: Medicare Other | Admitting: Nurse Practitioner

## 2020-05-16 ENCOUNTER — Encounter: Payer: Self-pay | Admitting: Nurse Practitioner

## 2020-05-16 ENCOUNTER — Other Ambulatory Visit: Payer: Self-pay

## 2020-05-16 ENCOUNTER — Ambulatory Visit (INDEPENDENT_AMBULATORY_CARE_PROVIDER_SITE_OTHER): Payer: Medicare Other | Admitting: Nurse Practitioner

## 2020-05-16 VITALS — BP 130/82 | HR 64 | Temp 96.9°F | Ht 68.0 in | Wt 178.0 lb

## 2020-05-16 DIAGNOSIS — G4700 Insomnia, unspecified: Secondary | ICD-10-CM | POA: Diagnosis not present

## 2020-05-16 DIAGNOSIS — E782 Mixed hyperlipidemia: Secondary | ICD-10-CM

## 2020-05-16 DIAGNOSIS — K219 Gastro-esophageal reflux disease without esophagitis: Secondary | ICD-10-CM

## 2020-05-16 DIAGNOSIS — F39 Unspecified mood [affective] disorder: Secondary | ICD-10-CM

## 2020-05-16 LAB — COMPLETE METABOLIC PANEL WITH GFR
AG Ratio: 1.6 (calc) (ref 1.0–2.5)
ALT: 12 U/L (ref 6–29)
AST: 15 U/L (ref 10–35)
Albumin: 4.1 g/dL (ref 3.6–5.1)
Alkaline phosphatase (APISO): 46 U/L (ref 37–153)
BUN: 14 mg/dL (ref 7–25)
CO2: 28 mmol/L (ref 20–32)
Calcium: 9.7 mg/dL (ref 8.6–10.4)
Chloride: 103 mmol/L (ref 98–110)
Creat: 0.74 mg/dL (ref 0.50–0.99)
GFR, Est African American: 96 mL/min/{1.73_m2} (ref >=60)
GFR, Est Non African American: 83 mL/min/{1.73_m2} (ref >=60)
Globulin: 2.5 g/dL (calc) (ref 1.9–3.7)
Glucose, Bld: 90 mg/dL (ref 65–99)
Potassium: 5 mmol/L (ref 3.5–5.3)
Sodium: 139 mmol/L (ref 135–146)
Total Bilirubin: 0.6 mg/dL (ref 0.2–1.2)
Total Protein: 6.6 g/dL (ref 6.1–8.1)

## 2020-05-16 LAB — LIPID PANEL
Cholesterol: 215 mg/dL — ABNORMAL HIGH (ref <200)
HDL: 75 mg/dL (ref >=50)
LDL Cholesterol (Calc): 121 mg/dL (calc) — ABNORMAL HIGH
Non-HDL Cholesterol (Calc): 140 mg/dL (calc) — ABNORMAL HIGH (ref <130)
Total CHOL/HDL Ratio: 2.9 (calc) (ref <5.0)
Triglycerides: 90 mg/dL (ref <150)

## 2020-05-16 NOTE — Progress Notes (Signed)
Careteam: Patient Care Team: Lauree Chandler, NP as PCP - General (Geriatric Medicine) Webb Laws, [place] as Referring Physician (Optometry) Druscilla Brownie, MD as Consulting Physician (Dermatology)  PLACE OF SERVICE:  [place] Directive information Does Patient Have a Medical Advance Directive?: Yes, Type of Advance Directive: [place];Living will, Does patient want to make changes to medical advance directive?: No - Patient declined  Allergies  Allergen Reactions   Codeine Nausea Only    Also has dizziness   Omeprazole Other (See Comments)    GI upset     Chief Complaint  Patient presents with   Medical Management of Chronic Issues    6 month follow-up    Immunizations    Flu vaccine not in stock. Discuss need for TD vaccine      HPI: Patient is a 69 y.o. female for routine follow up.   Went to ortho due to left arm pain, diagnosised with adhesive capsulitis got sterid injection, trying to do exercises, arm is better.  Better ROM and pain improving, still sore.  Hyperlipidemia- fasting today, taking crestor every other day and trying to eat better. Walking with neighbor.   Mood disorder- stable on effexor.   GERD- taking supplement OTC that has been working well. Taking pepcid 10 mg daily    Review of Systems:  Review of Systems  Constitutional: Negative for chills, fever and weight loss.  HENT: Negative for tinnitus.   Respiratory: Negative for cough, sputum production and shortness of breath.   Cardiovascular: Negative for chest pain, palpitations and leg swelling.  Gastrointestinal: Negative for abdominal pain, constipation, diarrhea and heartburn.  Genitourinary: Negative for dysuria, frequency and urgency.  Musculoskeletal: Negative for back pain, falls, joint pain and myalgias.  Skin: Negative.   Neurological: Negative for dizziness and headaches.  Psychiatric/Behavioral: Negative for depression and  memory loss. The patient does not have insomnia.     Past Medical History:  Diagnosis Date   Cancer ([place]) 2001   breast    Cataracts, bilateral    GERD (gastroesophageal reflux disease)    Hyperlipidemia    Past Surgical History:  Procedure Laterality Date   BREAST SURGERY     RADIAL KERATOTOMY     Social History:   reports that she quit smoking about 29 years ago. Her smoking use included cigarettes. She has never used smokeless tobacco. She reports current alcohol use of about 13.0 - 15.0 standard drinks of alcohol per week. She reports that she does not use drugs.  Family History  Problem Relation Age of Onset   Hyperlipidemia Mother    Esophageal cancer Cousin 34   Colon cancer Neg Hx    Stomach cancer Neg Hx     Medications: Patient's Medications  New Prescriptions   No medications on file  Previous Medications   CHOLECALCIFEROL (VITAMIN D3 PO)    Take 500 Units by mouth daily.   CYANOCOBALAMIN (VITAMIN B 12 PO)    Take 1 tablet by mouth.   FAMOTIDINE (PEPCID) 20 MG TABLET    Take 20 mg by mouth at bedtime.   FISH OIL-OMEGA-3 FATTY ACIDS 1000 MG CAPSULE    Take 1 g by mouth daily.    MULTIPLE VITAMIN (MULTIVITAMIN) TABLET    Take 1 tablet by mouth daily.   RA CALCIUM HI-CAL PO    Take 3 tablets 1 time daily    ROSUVASTATIN (CRESTOR) 5 MG TABLET    Every other day   VENLAFAXINE (  EFFEXOR) 37.5 MG TABLET    TAKE 1 TABLET BY MOUTH TWICE DAILY   ZOLPIDEM (AMBIEN) 5 MG TABLET    TAKE 1/2 TABLET(2.5 MG) BY MOUTH AT BEDTIME AS NEEDED FOR SLEEP  Modified Medications   No medications on file  Discontinued Medications   No medications on file    Physical Exam:  Vitals:   05/16/20 1106  BP: 130/82  Pulse: 64  Temp: (!) 96.9 F (36.1 C)  TempSrc: Temporal  SpO2: 96%  Weight: 178 lb (80.7 kg)  Height: '5\' 8"'  (1.727 m)   Body mass index is 27.06 kg/m. Wt Readings from Last 3 Encounters:  05/16/20 178 lb (80.7 kg)  04/04/20 178 lb 3.2 oz (80.8 kg)    11/09/19 185 lb (83.9 kg)    Physical Exam Constitutional:      General: She is not in acute distress.    Appearance: She is well-developed. She is not diaphoretic.  HENT:     Head: Normocephalic and atraumatic.  Eyes:     Conjunctiva/sclera: Conjunctivae normal.     Pupils: Pupils are equal, round, and reactive to light.  Cardiovascular:     Rate and Rhythm: Normal rate and regular rhythm.     Heart sounds: Normal heart sounds.  Pulmonary:     Effort: Pulmonary effort is normal.     Breath sounds: Normal breath sounds.  Abdominal:     General: Bowel sounds are normal.     Palpations: Abdomen is soft.  Musculoskeletal:        General: No tenderness.     Cervical back: Normal range of motion and neck supple.  Skin:    General: Skin is warm and dry.  Neurological:     Mental Status: She is alert and oriented to person, place, and time.     Labs reviewed: Basic Metabolic Panel: Recent Labs    10/24/19 0835 02/04/20 0836  NA 138 136  K 4.5 4.3  CL 103 103  CO2 29 25  GLUCOSE 97 98  BUN 10 15  CREATININE 0.77 0.70  CALCIUM 9.2 9.7   Liver Function Tests: Recent Labs    10/24/19 0835 02/04/20 0836  AST 18 14  ALT 17 15  BILITOT 0.6 0.3  PROT 6.5 6.6   No results for input(s): LIPASE, AMYLASE in the last 8760 hours. No results for input(s): AMMONIA in the last 8760 hours. CBC: Recent Labs    10/24/19 0835 02/04/20 0836  WBC 4.6 6.6  NEUTROABS 2,470 3,960  HGB 13.4 13.5  HCT 39.8 40.9  MCV 91.7 93.4  PLT 234 243   Lipid Panel: Recent Labs    10/24/19 0835 02/04/20 0836  CHOL 224* 200*  HDL 66 48*  LDLCALC 140* 113*  TRIG 83 270*  CHOLHDL 3.4 4.2   TSH: No results for input(s): TSH in the last 8760 hours. A1C: No results found for: HGBA1C   Assessment/Plan 1. Mixed hyperlipidemia -has made dietary modifications, increased physical activity and increased crestor to every other day, will get fasting labs to follow up at thi stime. - CMP  with eGFR([facility]) - Lipid panel  2. Mood disorder ([place]) Stabl on effexor daily  3. Gastroesophageal reflux disease without esophagitis Controlled on supplement with pepcid 10 mg daily  4. Insomnia, unspecified type Stable, uses Ambien PRN Next appt: 6 months.  Carlos American. [place], [facility] [phone]

## 2020-05-16 NOTE — Patient Instructions (Addendum)
TDAP vaccine due- to get at local pharmacy.  shingrix vaccine due

## 2020-05-21 ENCOUNTER — Other Ambulatory Visit: Payer: Self-pay | Admitting: Nurse Practitioner

## 2020-05-21 DIAGNOSIS — E782 Mixed hyperlipidemia: Secondary | ICD-10-CM

## 2020-05-22 ENCOUNTER — Ambulatory Visit: Payer: Medicare Other | Admitting: Orthopedic Surgery

## 2020-05-22 DIAGNOSIS — M7502 Adhesive capsulitis of left shoulder: Secondary | ICD-10-CM

## 2020-05-24 ENCOUNTER — Encounter: Payer: Self-pay | Admitting: Orthopedic Surgery

## 2020-05-24 NOTE — Progress Notes (Signed)
Office Visit Note   Patient: Theresa Pena           Date of Birth: [birth year]           MRN: [id number] Visit Date: 05/22/2020 Requested by: Lauree Chandler, NP [place],  [place] [address] PCP: Lauree Chandler, NP  Subjective: Chief Complaint  Patient presents with  . Left Shoulder - Follow-up    HPI: Theresa Pena is a patient with left frozen shoulder.  She had an injection 05/01/2020 for frozen shoulder.  Doing much better.  75% improvement.  Doing exercises at home.  She is able to hook her bra now.  Still has some soreness with overhead motion and reaching across her body.              ROS: All systems reviewed are negative as they relate to the chief complaint within the history of present illness.  Patient denies  fevers or chills.   Assessment & Plan: Visit Diagnoses:  1. Adhesive capsulitis of left shoulder     Plan: Impression is improvement left frozen shoulder status post intra-articular injection 05/01/2020.  All in all she is making good progress.  Continue with anti-inflammatories and a home exercise stretching program.  Decision today was for or against formal physical therapy and repeat injection.  With her improvement I would stay the course and have her continue doing home exercises and anti-inflammatories.  Follow-up as needed.  Follow-Up Instructions: Return if symptoms worsen or fail to improve.   Orders:  No orders of the defined types were placed in this encounter.  No orders of the defined types were placed in this encounter.     Procedures: No procedures performed   Clinical Data: No additional findings.  Objective: Vital Signs: There were no vitals taken for this visit.  Physical Exam:   Constitutional: Patient appears well-developed HEENT:  Head: Normocephalic Eyes:EOM are normal Neck: Normal range of motion Cardiovascular: Normal rate Pulmonary/chest: Effort normal Neurologic: Patient is alert Skin: Skin is  warm Psychiatric: Patient has normal mood and affect    Ortho Exam: Ortho exam demonstrates good cervical spine range of motion.  Left shoulder demonstrates external rotation of 15 degrees of abduction to about 30 degrees.  Isolated glenohumeral forward flexion is 120.  Isolated glenohumeral abduction is about 85.  Rotator cuff strength is good.  Specialty Comments:  No specialty comments available.  Imaging: No results found.   PMFS History: Patient Active Problem List   Diagnosis Date Noted  . Mixed hyperlipidemia 09/23/2017  . High risk medication use 09/23/2017  . Insomnia 09/23/2017  . History of breast cancer in female 03/14/2015  . Mood disorder ([place]) 02/24/2012   Past Medical History:  Diagnosis Date  . Cancer ([place]) 2001   breast   . Cataracts, bilateral   . GERD (gastroesophageal reflux disease)   . Hyperlipidemia     Family History  Problem Relation Age of Onset  . Hyperlipidemia Mother   . Esophageal cancer Cousin 31  . Colon cancer Neg Hx   . Stomach cancer Neg Hx     Past Surgical History:  Procedure Laterality Date  . BREAST SURGERY    . RADIAL KERATOTOMY     Social History   Occupational History  . Not on file  Tobacco Use  . Smoking status: Former Smoker    Types: Cigarettes    Quit date: 10/04/1990    Years since quitting: 29.6  . Smokeless tobacco: Never Used  Vaping Use  . Vaping Use: Never used  Substance and Sexual Activity  . Alcohol use: Yes    Alcohol/week: 13.0 - 15.0 standard drinks    Types: 8 Glasses of wine, 5 - 7 Standard drinks or equivalent per week  . Drug use: No  . Sexual activity: Not on file

## 2020-08-05 ENCOUNTER — Other Ambulatory Visit: Payer: Self-pay | Admitting: Nurse Practitioner

## 2020-08-14 ENCOUNTER — Ambulatory Visit: Payer: Medicare Other | Admitting: Orthopedic Surgery

## 2020-08-14 DIAGNOSIS — M7502 Adhesive capsulitis of left shoulder: Secondary | ICD-10-CM

## 2020-08-16 ENCOUNTER — Encounter: Payer: Self-pay | Admitting: Orthopedic Surgery

## 2020-08-16 DIAGNOSIS — M7502 Adhesive capsulitis of left shoulder: Secondary | ICD-10-CM

## 2020-08-16 MED ORDER — METHYLPREDNISOLONE ACETATE 40 MG/ML IJ SUSP
40.0000 mg | INTRAMUSCULAR | Status: AC | PRN
  Administered 2020-08-16: 40 mg via INTRA_ARTICULAR

## 2020-08-16 MED ORDER — BUPIVACAINE HCL 0.5 % IJ SOLN
9.0000 mL | INTRAMUSCULAR | Status: AC | PRN
  Administered 2020-08-16: 9 mL via INTRA_ARTICULAR

## 2020-08-16 MED ORDER — LIDOCAINE HCL 1 % IJ SOLN
5.0000 mL | INTRAMUSCULAR | Status: AC | PRN
  Administered 2020-08-16: 5 mL

## 2020-08-16 NOTE — Progress Notes (Signed)
Office Visit Note   Patient: Theresa Pena           Date of Birth: [birth year]           MRN: [id number] Visit Date: 08/14/2020 Requested by: Lauree Chandler, NP [place],  [place] [address] PCP: Lauree Chandler, NP  Subjective: Chief Complaint  Patient presents with  . Left Shoulder - Follow-up    HPI: Theresa Pena is a 69-year-old patient with left shoulder pain.  2 weeks ago had booster covered vaccine.  Has been doing home exercise program.  Still has pain and some limitation of motion which is getting slightly worse since her booster shot.  She would like to have another injection into her shoulder region.              ROS: All systems reviewed are negative as they relate to the chief complaint within the history of present illness.  Patient denies  fevers or chills.   Assessment & Plan: Visit Diagnoses:  1. Adhesive capsulitis of left shoulder     Plan: Impression is mild recurrence of left frozen shoulder.  Plan is intra-articular glenohumeral joint injection today and continuation of home exercise program to focus on stretching.  Follow-up as needed  Follow-Up Instructions: Return if symptoms worsen or fail to improve.   Orders:  No orders of the defined types were placed in this encounter.  No orders of the defined types were placed in this encounter.     Procedures: Large Joint Inj: L glenohumeral on 08/16/2020 8:57 PM Indications: diagnostic evaluation and pain Details: 18 G 1.5 in needle, posterior approach  Arthrogram: No  Medications: 9 mL bupivacaine 0.5 %; 40 mg methylPREDNISolone acetate 40 MG/ML; 5 mL lidocaine 1 % Outcome: tolerated well, no immediate complications Procedure, treatment alternatives, risks and benefits explained, specific risks discussed. Consent was given by the patient. Immediately prior to procedure a time out was called to verify the correct patient, procedure, equipment, support staff and site/side marked as  required. Patient was prepped and draped in the usual sterile fashion.       Clinical Data: No additional findings.  Objective: Vital Signs: There were no vitals taken for this visit.  Physical Exam:   Constitutional: Patient appears well-developed HEENT:  Head: Normocephalic Eyes:EOM are normal Neck: Normal range of motion Cardiovascular: Normal rate Pulmonary/chest: Effort normal Neurologic: Patient is alert Skin: Skin is warm Psychiatric: Patient has normal mood and affect    Ortho Exam: Ortho exam demonstrates full active and passive range of motion of the cervical spine.  Left shoulder has forward flexion to about 140 isolated glenohumeral abduction to just below 90 and external rotation of 15 degrees of abduction to about 30.  Rotator cuff strength is good.  No masses lymphadenopathy or skin changes noted in that shoulder girdle region.  Specialty Comments:  No specialty comments available.  Imaging: No results found.   PMFS History: Patient Active Problem List   Diagnosis Date Noted  . Mixed hyperlipidemia 09/23/2017  . High risk medication use 09/23/2017  . Insomnia 09/23/2017  . History of breast cancer in female 03/14/2015  . Mood disorder ([facility]) 02/24/2012   Past Medical History:  Diagnosis Date  . Cancer ([place]) 2001   breast   . Cataracts, bilateral   . GERD (gastroesophageal reflux disease)   . Hyperlipidemia     Family History  Problem Relation Age of Onset  . Hyperlipidemia Mother   . Esophageal cancer Cousin  53  . Colon cancer Neg Hx   . Stomach cancer Neg Hx     Past Surgical History:  Procedure Laterality Date  . BREAST SURGERY    . RADIAL KERATOTOMY     Social History   Occupational History  . Not on file  Tobacco Use  . Smoking status: Former Smoker    Types: Cigarettes    Quit date: 10/04/1990    Years since quitting: 29.8  . Smokeless tobacco: Never Used  Vaping Use  . Vaping Use: Never used  Substance and Sexual Activity    . Alcohol use: Yes    Alcohol/week: 13.0 - 15.0 standard drinks    Types: 8 Glasses of wine, 5 - 7 Standard drinks or equivalent per week  . Drug use: No  . Sexual activity: Not on file

## 2020-09-05 LAB — HM PAP SMEAR

## 2020-09-06 ENCOUNTER — Other Ambulatory Visit: Payer: Self-pay | Admitting: Nurse Practitioner

## 2020-09-06 DIAGNOSIS — F39 Unspecified mood [affective] disorder: Secondary | ICD-10-CM

## 2020-09-18 ENCOUNTER — Telehealth: Payer: Self-pay | Admitting: Nurse Practitioner

## 2020-09-18 NOTE — Telephone Encounter (Signed)
Patient is scheduled for six month fu on 11/22/19.  Your note didn't indicate anything about labs prior to this appointment.  Patient states that she would like to have labs done.  Please advise patient of what labs she would need to have.   Thank you  Adan Sis

## 2020-09-19 ENCOUNTER — Other Ambulatory Visit: Payer: Self-pay | Admitting: Nurse Practitioner

## 2020-09-19 ENCOUNTER — Encounter: Payer: Self-pay | Admitting: Nurse Practitioner

## 2020-09-19 DIAGNOSIS — E782 Mixed hyperlipidemia: Secondary | ICD-10-CM

## 2020-09-19 DIAGNOSIS — K219 Gastro-esophageal reflux disease without esophagitis: Secondary | ICD-10-CM

## 2020-09-19 NOTE — Telephone Encounter (Signed)
Lab orders placed, okay to make appt for fasting blood work

## 2020-11-02 ENCOUNTER — Other Ambulatory Visit: Payer: Self-pay | Admitting: Nurse Practitioner

## 2020-11-02 DIAGNOSIS — G4700 Insomnia, unspecified: Secondary | ICD-10-CM

## 2020-11-02 DIAGNOSIS — E782 Mixed hyperlipidemia: Secondary | ICD-10-CM

## 2020-11-03 NOTE — Telephone Encounter (Signed)
Patient request refill on medication 

## 2020-11-13 ENCOUNTER — Ambulatory Visit: Payer: Medicare Other | Admitting: Surgical

## 2020-11-14 ENCOUNTER — Encounter: Payer: Self-pay | Admitting: Surgical

## 2020-11-14 ENCOUNTER — Other Ambulatory Visit: Payer: Self-pay

## 2020-11-14 ENCOUNTER — Ambulatory Visit (INDEPENDENT_AMBULATORY_CARE_PROVIDER_SITE_OTHER): Payer: Medicare Other | Admitting: Surgical

## 2020-11-14 ENCOUNTER — Ambulatory Visit: Payer: Self-pay

## 2020-11-14 DIAGNOSIS — M7502 Adhesive capsulitis of left shoulder: Secondary | ICD-10-CM

## 2020-11-14 DIAGNOSIS — M19012 Primary osteoarthritis, left shoulder: Secondary | ICD-10-CM

## 2020-11-14 NOTE — Progress Notes (Signed)
xr sho

## 2020-11-15 ENCOUNTER — Encounter: Payer: Self-pay | Admitting: Surgical

## 2020-11-15 DIAGNOSIS — M19012 Primary osteoarthritis, left shoulder: Secondary | ICD-10-CM

## 2020-11-15 MED ORDER — LIDOCAINE HCL 1 % IJ SOLN
5.0000 mL | INTRAMUSCULAR | Status: AC | PRN
  Administered 2020-11-15: 5 mL

## 2020-11-15 MED ORDER — METHYLPREDNISOLONE ACETATE 40 MG/ML IJ SUSP
40.0000 mg | INTRAMUSCULAR | Status: AC | PRN
  Administered 2020-11-15: 40 mg via INTRA_ARTICULAR

## 2020-11-15 MED ORDER — BUPIVACAINE HCL 0.5 % IJ SOLN
9.0000 mL | INTRAMUSCULAR | Status: AC | PRN
  Administered 2020-11-15: 9 mL via INTRA_ARTICULAR

## 2020-11-15 NOTE — Progress Notes (Signed)
Office Visit Note   Patient: Theresa Pena           Date of Birth: 07/03/1951           MRN: [id number] Visit Date: 11/14/2020 Requested by: Lauree Chandler, NP [place],  [place] [address] PCP: Lauree Chandler, NP  Subjective: Chief Complaint  Patient presents with  . Left Shoulder - Pain    HPI: Theresa Pena is a 70 y.o. female who presents to the office complaining of left shoulder pain.  Patient had left shoulder injection for presumed adhesive capsulitis on 08/16/2020.  She states that her left shoulder is doing a little better as long as she is doing home exercise program.  However it has now started "popping" about 1 month ago.  No recent injury.  She has no subjective weakness of the shoulder.  She takes 2 Aleve and one Tylenol as needed for pain control.  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  She denies any history of diabetes, CKD, heart attack, stroke..                ROS: All systems reviewed are negative as they relate to the chief complaint within the history of present illness.  Patient denies fevers or chills.  Assessment & Plan: Visit Diagnoses:  1. Primary osteoarthritis, left shoulder     Plan: Patient is a 70 year old female who presents complaint of left shoulder pain.  She has started experiencing a popping sensation in the last month without any new injury.  New radiographs taken today show more advanced osteoarthritis of the left shoulder than was suggested by her last set of radiographs.  She has severe loss of joint space with moderate sized inferior humeral head osteophyte.  She does have excellent rotator cuff strength with no weakness on exam today.  This popping sensation that she is noticing now seems likely to be from the grinding from the loss of joint space.  She has moderately reduced left shoulder range of motion compared with the right side.  Discussed options available to patient.  She did  well with her previous shoulder injection.  She would like to try another one.  Discussed the natural history of left shoulder osteoarthritis with the potential need for shoulder placement in the future.  She would like to postpone this as long as possible.  A left glenohumeral joint injection was administered and patient tolerated the procedure well.  Plan to follow-up as needed.  She can receive these injections every 3 to 4 months.  Follow-Up Instructions: No follow-ups on file.   Orders:  Orders Placed This Encounter  Procedures  . XR Shoulder Left   No orders of the defined types were placed in this encounter.     Procedures: Large Joint Inj: L glenohumeral on 11/15/2020 11:21 AM Indications: diagnostic evaluation and pain Details: 18 G 1.5 in needle, posterior approach  Arthrogram: No  Medications: 9 mL bupivacaine 0.5 %; 40 mg methylPREDNISolone acetate 40 MG/ML; 5 mL lidocaine 1 % Outcome: tolerated well, no immediate complications Procedure, treatment alternatives, risks and benefits explained, specific risks discussed. Consent was given by the patient. Immediately prior to procedure a time out was called to verify the correct patient, procedure, equipment, support staff and site/side marked as required. Patient was prepped and draped in the usual sterile fashion.       Clinical Data: No additional findings.  Objective: Vital Signs: There were no  vitals taken for this visit.  Physical Exam:  Constitutional: Patient appears well-developed HEENT:  Head: Normocephalic Eyes:EOM are normal Neck: Normal range of motion Cardiovascular: Normal rate Pulmonary/chest: Effort normal Neurologic: Patient is alert Skin: Skin is warm Psychiatric: Patient has normal mood and affect  Ortho Exam:  Passive ROM Right Left  External Rotation  40  15  Abduction  100  70  Forward Flexion  165  90  Positive crepitus with ROM consistent with grinding from osteoarthritis.  This  passive range of motion is painful. 5/5 motor strength of bilateral supraspinatus, infraspinatus, subscapularis 5/5 motor strength of grip strength, finger abduction, pronation/supination, bicep, tricep, deltoid bilaterally Mild tenderness to palpation over the bicipital groove, no tenderness over the [facility] joint Negative O'Brien sign.  Negative drop arm test. No pain with c-spine ROM   Specialty Comments:  No specialty comments available.  Imaging: No results found.   PMFS History: Patient Active Problem List   Diagnosis Date Noted  . Mixed hyperlipidemia 09/23/2017  . High risk medication use 09/23/2017  . Insomnia 09/23/2017  . History of breast cancer in female 03/14/2015  . Mood disorder ([facility]) 02/24/2012   Past Medical History:  Diagnosis Date  . Cancer ([place]) 2001   breast   . Cataracts, bilateral   . GERD (gastroesophageal reflux disease)   . Hyperlipidemia     Family History  Problem Relation Age of Onset  . Hyperlipidemia Mother   . Esophageal cancer Cousin 49  . Colon cancer Neg Hx   . Stomach cancer Neg Hx     Past Surgical History:  Procedure Laterality Date  . BREAST SURGERY    . RADIAL KERATOTOMY     Social History   Occupational History  . Not on file  Tobacco Use  . Smoking status: Former Smoker    Types: Cigarettes    Quit date: 10/04/1990    Years since quitting: 30.1  . Smokeless tobacco: Never Used  Vaping Use  . Vaping Use: Never used  Substance and Sexual Activity  . Alcohol use: Yes    Alcohol/week: 13.0 - 15.0 standard drinks    Types: 8 Glasses of wine, 5 - 7 Standard drinks or equivalent per week  . Drug use: No  . Sexual activity: Not on file

## 2020-11-18 ENCOUNTER — Other Ambulatory Visit: Payer: Medicare Other

## 2020-11-18 ENCOUNTER — Other Ambulatory Visit: Payer: Self-pay

## 2020-11-18 DIAGNOSIS — E782 Mixed hyperlipidemia: Secondary | ICD-10-CM

## 2020-11-18 DIAGNOSIS — K219 Gastro-esophageal reflux disease without esophagitis: Secondary | ICD-10-CM

## 2020-11-19 LAB — LIPID PANEL
Cholesterol: 204 mg/dL — ABNORMAL HIGH (ref <200)
HDL: 72 mg/dL (ref >=50)
LDL Cholesterol (Calc): 103 mg/dL (calc) — ABNORMAL HIGH
Non-HDL Cholesterol (Calc): 132 mg/dL (calc) — ABNORMAL HIGH (ref <130)
Total CHOL/HDL Ratio: 2.8 (calc) (ref <5.0)
Triglycerides: 169 mg/dL — ABNORMAL HIGH (ref <150)

## 2020-11-19 LAB — CBC WITH DIFFERENTIAL/PLATELET
Absolute Monocytes: 819 cells/uL (ref 200–950)
Basophils Absolute: 64 cells/uL (ref 0–200)
Basophils Relative: 0.7 %
Eosinophils Absolute: 212 cells/uL (ref 15–500)
Eosinophils Relative: 2.3 %
HCT: 40.4 % (ref 35.0–45.0)
Hemoglobin: 13.8 g/dL (ref 11.7–15.5)
Lymphs Abs: 3192 cells/uL (ref 850–3900)
MCH: 31.9 pg (ref 27.0–33.0)
MCHC: 34.2 g/dL (ref 32.0–36.0)
MCV: 93.5 fL (ref 80.0–100.0)
MPV: 12.3 fL (ref 7.5–12.5)
Monocytes Relative: 8.9 %
Neutro Abs: 4913 cells/uL (ref 1500–7800)
Neutrophils Relative %: 53.4 %
Platelets: 265 10*3/uL (ref 140–400)
RBC: 4.32 10*6/uL (ref 3.80–5.10)
RDW: 12.6 % (ref 11.0–15.0)
Total Lymphocyte: 34.7 %
WBC: 9.2 10*3/uL (ref 3.8–10.8)

## 2020-11-19 LAB — COMPLETE METABOLIC PANEL WITH GFR
AG Ratio: 1.5 (calc) (ref 1.0–2.5)
ALT: 15 U/L (ref 6–29)
AST: 14 U/L (ref 10–35)
Albumin: 4.1 g/dL (ref 3.6–5.1)
Alkaline phosphatase (APISO): 49 U/L (ref 37–153)
BUN: 19 mg/dL (ref 7–25)
CO2: 29 mmol/L (ref 20–32)
Calcium: 9.8 mg/dL (ref 8.6–10.4)
Chloride: 100 mmol/L (ref 98–110)
Creat: 0.81 mg/dL (ref 0.60–0.93)
GFR, Est African American: 85 mL/min/{1.73_m2} (ref >=60)
GFR, Est Non African American: 74 mL/min/{1.73_m2} (ref >=60)
Globulin: 2.7 g/dL (calc) (ref 1.9–3.7)
Glucose, Bld: 82 mg/dL (ref 65–99)
Potassium: 5 mmol/L (ref 3.5–5.3)
Sodium: 139 mmol/L (ref 135–146)
Total Bilirubin: 0.4 mg/dL (ref 0.2–1.2)
Total Protein: 6.8 g/dL (ref 6.1–8.1)

## 2020-11-21 ENCOUNTER — Other Ambulatory Visit: Payer: Self-pay

## 2020-11-21 ENCOUNTER — Ambulatory Visit: Payer: Medicare Other | Admitting: Nurse Practitioner

## 2020-11-21 ENCOUNTER — Encounter: Payer: Self-pay | Admitting: Nurse Practitioner

## 2020-11-21 VITALS — BP 150/90 | HR 58 | Temp 96.9°F | Ht 68.0 in | Wt 182.8 lb

## 2020-11-21 DIAGNOSIS — R030 Elevated blood-pressure reading, without diagnosis of hypertension: Secondary | ICD-10-CM

## 2020-11-21 DIAGNOSIS — E782 Mixed hyperlipidemia: Secondary | ICD-10-CM

## 2020-11-21 DIAGNOSIS — K219 Gastro-esophageal reflux disease without esophagitis: Secondary | ICD-10-CM | POA: Diagnosis not present

## 2020-11-21 DIAGNOSIS — F39 Unspecified mood [affective] disorder: Secondary | ICD-10-CM

## 2020-11-21 DIAGNOSIS — M19012 Primary osteoarthritis, left shoulder: Secondary | ICD-10-CM | POA: Diagnosis not present

## 2020-11-21 DIAGNOSIS — G4700 Insomnia, unspecified: Secondary | ICD-10-CM

## 2020-11-21 NOTE — Progress Notes (Signed)
Careteam: Patient Care Team: Lauree Chandler, NP as PCP - General (Geriatric Medicine) Webb Laws, [place] as Referring Physician (Optometry) Druscilla Brownie, MD as Consulting Physician (Dermatology)  PLACE OF SERVICE:  [facility] Directive information Does Patient Have a Medical Advance Directive?: Yes, Type of Advance Directive: [place];Living will, Does patient want to make changes to medical advance directive?: No - Patient declined  Allergies  Allergen Reactions  . Codeine Nausea Only    Also has dizziness  . Omeprazole Other (See Comments)    GI upset     Chief Complaint  Patient presents with  . Medical Management of Chronic Issues    6 month follow up. Discuss the need for Tetanus/tdap, Flu vaccine and colonoscopy.Patient does not want Flu vaccine.     HPI: Patient is a 70 y.o. female here for follow-up.  Osteoarthritis-  Has left shoulder osteoarthritis, sees [facility]. Just received a cortisone injection last week, feels better overall. Trying to find a job with less physical exertion.  Hyperlipidemia- taking Crestor every other day. Admits to eating fast food here recently.  Diet/Exercise- Has been eating fast foods recently. Will start cooking more meals and eating healthier foods. 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  Mood disorder- feeling good today, continues taking Effexor. Feeling down and exhausted recently due to taking care of her sister. Has the next few days off and will take time for herself. Enjoys dog sitting in her spare time.  GERD- taking pepcid 10mg  daily, overall stable.   Review of Systems:  Review of Systems  Constitutional: Negative for chills, fever and weight loss.  HENT: Negative for tinnitus.   Respiratory: Negative for cough, sputum production and shortness of breath.   Cardiovascular: Negative for chest pain, palpitations and leg swelling.  Gastrointestinal: Negative for abdominal pain,  constipation, nausea and vomiting.  Genitourinary: Negative for dysuria, frequency and urgency.  Musculoskeletal: Negative for back pain, falls and myalgias.  Skin: Negative.   Neurological: Negative for dizziness, weakness and headaches.  Psychiatric/Behavioral: Negative for depression and memory loss. The patient does not have insomnia.     Past Medical History:  Diagnosis Date  . Cancer ([place]) 2001   breast   . Cataracts, bilateral   . GERD (gastroesophageal reflux disease)   . Hyperlipidemia    Past Surgical History:  Procedure Laterality Date  . BREAST SURGERY    . RADIAL KERATOTOMY     Social History:   reports that she quit smoking about 30 years ago. Her smoking use included cigarettes. She has never used smokeless tobacco. She reports current alcohol use of about 13.0 - 15.0 standard drinks of alcohol per week. She reports that she does not use drugs.  Family History  Problem Relation Age of Onset  . Hyperlipidemia Mother   . Esophageal cancer Cousin 82  . Colon cancer Neg Hx   . Stomach cancer Neg Hx     Medications: Patient's Medications  New Prescriptions   No medications on file  Previous Medications   CHOLECALCIFEROL (VITAMIN D3 PO)    Take 500 Units by mouth daily.   CYANOCOBALAMIN (VITAMIN B 12 PO)    Take 1 tablet by mouth.   FAMOTIDINE (PEPCID) 20 MG TABLET    TAKE 1/2 TABLET BY MOUTH EVERY MORNING AND 1 TABLET EVERY EVENING   FISH OIL-OMEGA-3 FATTY ACIDS 1000 MG CAPSULE    Take 1 g by mouth daily.    MULTIPLE VITAMIN (MULTIVITAMIN) TABLET  Take 1 tablet by mouth daily.   RA CALCIUM HI-CAL PO    Take 3 tablets 1 time daily   ROSUVASTATIN (CRESTOR) 5 MG TABLET    TAKE 1 TABLET BY MOUTH EVERY OTHER DAY   VENLAFAXINE (EFFEXOR) 37.5 MG TABLET    TAKE 1 TABLET BY MOUTH TWICE DAILY   ZOLPIDEM (AMBIEN) 5 MG TABLET    TAKE 1/2 TABLET(2.5 MG) BY MOUTH AT BEDTIME AS NEEDED FOR SLEEP  Modified Medications   No medications on file  Discontinued Medications   No  medications on file    Physical Exam:  Vitals:   11/21/20 1059  BP: (!) 150/90  Pulse: (!) 58  Temp: (!) 96.9 F (36.1 C)  TempSrc: Temporal  SpO2: 97%  Weight: 182 lb 12.8 oz (82.9 kg)  Height: 5\' 8"  (1.727 m)   Body mass index is 27.79 kg/m. Wt Readings from Last 3 Encounters:  11/21/20 182 lb 12.8 oz (82.9 kg)  05/16/20 178 lb (80.7 kg)  04/04/20 178 lb 3.2 oz (80.8 kg)    Physical Exam Constitutional:      General: She is not in acute distress.    Appearance: Normal appearance. She is not diaphoretic.  HENT:     Head: Normocephalic and atraumatic.     Mouth/Throat:     Pharynx: No oropharyngeal exudate.  Eyes:     Conjunctiva/sclera: Conjunctivae normal.     Pupils: Pupils are equal, round, and reactive to light.  Cardiovascular:     Rate and Rhythm: Normal rate and regular rhythm.     Heart sounds: Normal heart sounds.  Pulmonary:     Effort: Pulmonary effort is normal.     Breath sounds: Normal breath sounds.  Abdominal:     General: Bowel sounds are normal.     Palpations: Abdomen is soft.  Musculoskeletal:        General: No swelling. Normal range of motion.     Left shoulder: Tenderness present.  Skin:    General: Skin is warm and dry.  Neurological:     Mental Status: She is alert and oriented to person, place, and time.    Labs reviewed: Basic Metabolic Panel: Recent Labs    02/04/20 0836 05/16/20 1134 11/18/20 0813  NA 136 139 139  K 4.3 5.0 5.0  CL 103 103 100  CO2 25 28 29   GLUCOSE 98 90 82  BUN 15 14 19   CREATININE 0.70 0.74 0.81  CALCIUM 9.7 9.7 9.8   Liver Function Tests: Recent Labs    02/04/20 0836 05/16/20 1134 11/18/20 0813  AST 14 15 14   ALT 15 12 15   BILITOT 0.3 0.6 0.4  PROT 6.6 6.6 6.8   No results for input(s): LIPASE, AMYLASE in the last 8760 hours. No results for input(s): AMMONIA in the last 8760 hours. CBC: Recent Labs    02/04/20 0836 11/18/20 0813  WBC 6.6 9.2  NEUTROABS 3,960 4,913  HGB 13.5 13.8   HCT 40.9 40.4  MCV 93.4 93.5  PLT 243 265   Lipid Panel: Recent Labs    02/04/20 0836 05/16/20 1134 11/18/20 0813  CHOL 200* 215* 204*  HDL 48* 75 72  LDLCALC 113* 121* 103*  TRIG 270* 90 169*  CHOLHDL 4.2 2.9 2.8   TSH: No results for input(s): TSH in the last 8760 hours. A1C: No results found for: HGBA1C   Assessment/Plan 1. Osteoarthritis of left shoulder, unspecified osteoarthritis type Left shoulder pain, sees [facility]. Improved with cortisone injection.  2. Mixed hyperlipidemia Taking crestor every other day, LDL goal <100 last lab LDL down to 103. Will continue on current regimen. Encouraged to start cooking meals again and decrease fast food intake.  3. Gastroesophaeal reflux disease without esophagitis Controlled on supplement with Pepcid 10mg  daily. No heart burn or chest pain.  4. Mood disorder ([place]) Overall stable, taking effexor daily. Encouraged to start finding time for herself, continue to stay active and eat healthier meals.   5. Insomnia, unspecified type Stable on Ambien, uses it as needed.  6 elevated blood pressure bp generally not has high as it is today, reports some stress with sister, will work on dietary modifications as well.  bp improved to 146/86 on recheck  Next appt: 6 months Shayley Medlin K. Ezmeralda Stefanick, [facility] [phone]  I personally was present during the history, physical exam and medical decision-making activities of this service and have verified that the service and findings are accurately documented in the student's note

## 2020-11-21 NOTE — Patient Instructions (Signed)
Can try melatonin 1 mg - 1/2 tablet at bedtime as needed for sleep.    DASH Eating Plan DASH stands for Dietary Approaches to Stop Hypertension. The DASH eating plan is a healthy eating plan that has been shown to:  Reduce high blood pressure (hypertension).  Reduce your risk for type 2 diabetes, heart disease, and stroke.  Help with weight loss. What are tips for following this plan? Reading food labels  Check food labels for the amount of salt (sodium) per serving. Choose foods with less than 5 percent of the Daily Value of sodium. Generally, foods with less than 300 milligrams (mg) of sodium per serving fit into this eating plan.  To find whole grains, look for the word "whole" as the first word in the ingredient list. Shopping  Buy products labeled as "low-sodium" or "no salt added."  Buy fresh foods. Avoid canned foods and pre-made or frozen meals. Cooking  Avoid adding salt when cooking. Use salt-free seasonings or herbs instead of table salt or sea salt. Check with your health care provider or pharmacist before using salt substitutes.  Do not fry foods. Cook foods using healthy methods such as baking, boiling, grilling, roasting, and broiling instead.  Cook with heart-healthy oils, such as olive, canola, avocado, soybean, or sunflower oil. Meal planning  Eat a balanced diet that includes: ? 4 or more servings of fruits and 4 or more servings of vegetables each day. Try to fill one-half of your plate with fruits and vegetables. ? 6-8 servings of whole grains each day. ? Less than 6 oz (170 g) of lean meat, poultry, or fish each day. A 3-oz (85-g) serving of meat is about the same size as a deck of cards. One egg equals 1 oz (28 g). ? 2-3 servings of low-fat dairy each day. One serving is 1 cup (237 mL). ? 1 serving of nuts, seeds, or beans 5 times each week. ? 2-3 servings of heart-healthy fats. Healthy fats called omega-3 fatty acids are found in foods such as walnuts,  flaxseeds, fortified milks, and eggs. These fats are also found in cold-water fish, such as sardines, salmon, and mackerel.  Limit how much you eat of: ? Canned or prepackaged foods. ? Food that is high in trans fat, such as some fried foods. ? Food that is high in saturated fat, such as fatty meat. ? Desserts and other sweets, sugary drinks, and other foods with added sugar. ? Full-fat dairy products.  Do not salt foods before eating.  Do not eat more than 4 egg yolks a week.  Try to eat at least 2 vegetarian meals a week.  Eat more home-cooked food and less restaurant, buffet, and fast food.   Lifestyle  When eating at a restaurant, ask that your food be prepared with less salt or no salt, if possible.  If you drink alcohol: ? Limit how much you use to:  0-1 drink a day for women who are not pregnant.  0-2 drinks a day for men. ? Be aware of how much alcohol is in your drink. In the [place] one drink equals one 12 oz bottle of beer (355 mL), one 5 oz glass of wine (148 mL), or one 1 oz glass of hard liquor (44 mL). General information  Avoid eating more than 2,300 mg of salt a day. If you have hypertension, you may need to reduce your sodium intake to 1,500 mg a day.  Work with your health care provider to maintain  a healthy body weight or to lose weight. Ask what an ideal weight is for you.  Get at least 30 minutes of exercise that causes your heart to beat faster (aerobic exercise) most days of the week. Activities may include walking, swimming, or biking.  Work with your health care provider or dietitian to adjust your eating plan to your individual calorie needs. What foods should I eat? Fruits All fresh, dried, or frozen fruit. Canned fruit in natural juice (without added sugar). Vegetables Fresh or frozen vegetables (raw, steamed, roasted, or grilled). Low-sodium or reduced-sodium tomato and vegetable juice. Low-sodium or reduced-sodium tomato sauce and tomato paste.  Low-sodium or reduced-sodium canned vegetables. Grains Whole-grain or whole-wheat bread. Whole-grain or whole-wheat pasta. Brown rice. Theresa Pena. Bulgur. Whole-grain and low-sodium cereals. Pita bread. Low-fat, low-sodium crackers. Whole-wheat flour tortillas. Meats and other proteins Skinless chicken or [place]. Ground chicken or [place]. Pork with fat trimmed off. Fish and seafood. Egg whites. Dried beans, peas, or lentils. Unsalted nuts, nut butters, and seeds. Unsalted canned beans. Lean cuts of beef with fat trimmed off. Low-sodium, lean precooked or cured meat, such as sausages or meat loaves. Dairy Low-fat (1%) or fat-free (skim) milk. Reduced-fat, low-fat, or fat-free cheeses. Nonfat, low-sodium ricotta or cottage cheese. Low-fat or nonfat yogurt. Low-fat, low-sodium cheese. Fats and oils Soft margarine without trans fats. Vegetable oil. Reduced-fat, low-fat, or light mayonnaise and salad dressings (reduced-sodium). Canola, safflower, olive, avocado, soybean, and sunflower oils. Avocado. Seasonings and condiments Herbs. Spices. Seasoning mixes without salt. Other foods Unsalted popcorn and pretzels. Fat-free sweets. The items listed above may not be a complete list of foods and beverages you can eat. Contact a dietitian for more information. What foods should I avoid? Fruits Canned fruit in a light or heavy syrup. Fried fruit. Fruit in cream or butter sauce. Vegetables Creamed or fried vegetables. Vegetables in a cheese sauce. Regular canned vegetables (not low-sodium or reduced-sodium). Regular canned tomato sauce and paste (not low-sodium or reduced-sodium). Regular tomato and vegetable juice (not low-sodium or reduced-sodium). Theresa Pena. Olives. Grains Baked goods made with fat, such as croissants, muffins, or some breads. Dry pasta or rice meal packs. Meats and other proteins Fatty cuts of meat. Ribs. Fried meat. Theresa Pena. Bologna, salami, and other precooked or cured meats, such as  sausages or meat loaves. Fat from the back of a pig (fatback). Bratwurst. Salted nuts and seeds. Canned beans with added salt. Canned or smoked fish. Whole eggs or egg yolks. Chicken or [place] with skin. Dairy Whole or 2% milk, cream, and half-and-half. Whole or full-fat cream cheese. Whole-fat or sweetened yogurt. Full-fat cheese. Nondairy creamers. Whipped toppings. Processed cheese and cheese spreads. Fats and oils Butter. Stick margarine. Lard. Shortening. Ghee. Bacon fat. Tropical oils, such as coconut, palm kernel, or palm oil. Seasonings and condiments Onion salt, garlic salt, seasoned salt, table salt, and sea salt. Worcestershire sauce. Tartar sauce. Barbecue sauce. Teriyaki sauce. Soy sauce, including reduced-sodium. Steak sauce. Canned and packaged gravies. Fish sauce. Oyster sauce. Cocktail sauce. Store-bought horseradish. Ketchup. Mustard. Meat flavorings and tenderizers. Bouillon cubes. Hot sauces. Pre-made or packaged marinades. Pre-made or packaged taco seasonings. Relishes. Regular salad dressings. Other foods Salted popcorn and pretzels. The items listed above may not be a complete list of foods and beverages you should avoid. Contact a dietitian for more information. Where to find more information  National Heart, Lung, and Blood Institute: https://wilson-eaton.com/  American Heart Association: www.heart.org  Academy of Nutrition and Dietetics: www.eatright.[place]: www.kidney.org Summary  The DASH eating plan is a healthy eating plan that has been shown to reduce high blood pressure (hypertension). It may also reduce your risk for type 2 diabetes, heart disease, and stroke.  When on the DASH eating plan, aim to eat more fresh fruits and vegetables, whole grains, lean proteins, low-fat dairy, and heart-healthy fats.  With the DASH eating plan, you should limit salt (sodium) intake to 2,300 mg a day. If you have hypertension, you may need to reduce your  sodium intake to 1,500 mg a day.  Work with your health care provider or dietitian to adjust your eating plan to your individual calorie needs. This information is not intended to replace advice given to you by your health care provider. Make sure you discuss any questions you have with your health care provider. Document Revised: 08/24/2019 Document Reviewed: 08/24/2019 Elsevier Patient Education  2021 [employer].

## 2021-02-26 ENCOUNTER — Encounter: Payer: Self-pay | Admitting: *Deleted

## 2021-02-26 LAB — HM DEXA SCAN

## 2021-03-18 ENCOUNTER — Telehealth: Payer: Self-pay

## 2021-03-18 ENCOUNTER — Other Ambulatory Visit: Payer: Self-pay | Admitting: Nurse Practitioner

## 2021-03-18 DIAGNOSIS — F39 Unspecified mood [affective] disorder: Secondary | ICD-10-CM

## 2021-03-18 NOTE — Telephone Encounter (Signed)
Called to discuss bone density results with patient per [name], NP. Patient's results showed osteopenia. Worse in spine,but better in hips. Patient is to continue calcium with vitamin d and weight bearing exercise.

## 2021-03-20 NOTE — Telephone Encounter (Signed)
Tried calling patient again to give results of bone density. Left message for patient to call office.

## 2021-04-02 NOTE — Telephone Encounter (Signed)
Discussed results with patient, patient verbalized understanding of results. Updated medication list to reflect patients current calcium intake

## 2021-04-09 ENCOUNTER — Ambulatory Visit: Payer: Medicare Other | Admitting: Orthopedic Surgery

## 2021-04-09 DIAGNOSIS — M79642 Pain in left hand: Secondary | ICD-10-CM

## 2021-04-09 DIAGNOSIS — M19012 Primary osteoarthritis, left shoulder: Secondary | ICD-10-CM | POA: Diagnosis not present

## 2021-04-11 ENCOUNTER — Encounter: Payer: Self-pay | Admitting: Orthopedic Surgery

## 2021-04-11 MED ORDER — LIDOCAINE HCL 1 % IJ SOLN
5.0000 mL | INTRAMUSCULAR | Status: AC | PRN
  Administered 2021-04-09: 5 mL

## 2021-04-11 MED ORDER — BUPIVACAINE HCL 0.5 % IJ SOLN
9.0000 mL | INTRAMUSCULAR | Status: AC | PRN
  Administered 2021-04-09: 9 mL via INTRA_ARTICULAR

## 2021-04-11 MED ORDER — METHYLPREDNISOLONE ACETATE 40 MG/ML IJ SUSP
40.0000 mg | INTRAMUSCULAR | Status: AC | PRN
  Administered 2021-04-09: 40 mg via INTRA_ARTICULAR

## 2021-04-11 NOTE — Progress Notes (Signed)
Office Visit Note   Patient: Theresa Pena           Date of Birth: [birth year]           MRN: [id number] Visit Date: 04/09/2021 Requested by: Lauree Chandler, NP [place],  [place] [id number] PCP: Lauree Chandler, NP  Subjective: Chief Complaint  Patient presents with   Left Shoulder - Pain   Neck - Pain    HPI: Theresa Pena is a patient with left shoulder pain and arthritis.  She also reports left hand numbness and tingling in digits 4 and 5.  Runs from the elbow to good fingers.  She denies any significant neck symptoms.  She did well with her last injection in February until the middle of June.  Denies any interval injury.  Does not want to pursue surgery because her symptoms although aggravating or not debilitating at this time.              ROS: All systems reviewed are negative as they relate to the chief complaint within the history of present illness.  Patient denies  fevers or chills.   Assessment & Plan: Visit Diagnoses:  1. Pain in left hand     Plan: Impression is left shoulder arthritis.  Glenohumeral injection performed today.  Patient also is having some ulnar nerve symptoms in the left arm.  Plan at this time is nerve conduction study to evaluate for cubital tunnel syndrome versus Guyon canal compression.  Follow-up after that study.  Follow-Up Instructions: No follow-ups on file.   Orders:  Orders Placed This Encounter  Procedures   Ambulatory referral to Physical Medicine Rehab   No orders of the defined types were placed in this encounter.     Procedures: Large Joint Inj on 04/09/2021 5:43 PM Indications: diagnostic evaluation and pain Details: 18 G 1.5 in needle, posterior approach  Arthrogram: No  Medications: 9 mL bupivacaine 0.5 %; 40 mg methylPREDNISolone acetate 40 MG/ML; 5 mL lidocaine 1 % Outcome: tolerated well, no immediate complications Procedure, treatment alternatives, risks and benefits explained, specific risks discussed.  Consent was given by the patient. Immediately prior to procedure a time out was called to verify the correct patient, procedure, equipment, support staff and site/side marked as required. Patient was prepped and draped in the usual sterile fashion.      Clinical Data: No additional findings.  Objective: Vital Signs: There were no vitals taken for this visit.  Physical Exam:   Constitutional: Patient appears well-developed HEENT:  Head: Normocephalic Eyes:EOM are normal Neck: Normal range of motion Cardiovascular: Normal rate Pulmonary/chest: Effort normal Neurologic: Patient is alert Skin: Skin is warm Psychiatric: Patient has normal mood and affect   Ortho Exam: Ortho exam of the left shoulder demonstrates some coarse grinding with passive range of motion consistent with her known diagnosis of arthritis.  Rotator cuff strength is very good infraspinatus supraspinatus subscap muscle testing.  She is able to get her arm overhead just beyond 90 degrees of abduction and forward flexion.  No thenar wasting.  5 out of 5 grip EPL FPL interosseous wrist flexion wrist extension strength.  Negative Tinel's cubital tunnel with no subluxation of the ulnar nerve at the elbow.  Elbow and wrist range of motion is full.  Specialty Comments:  No specialty comments available.  Imaging: No results found.   PMFS History: Patient Active Problem List   Diagnosis Date Noted   Mixed hyperlipidemia 09/23/2017   High risk medication use  09/23/2017   Insomnia 09/23/2017   History of breast cancer in female 03/14/2015   Mood disorder ([place]) 02/24/2012   Past Medical History:  Diagnosis Date   Cancer ([place]) 2001   breast    Cataracts, bilateral    GERD (gastroesophageal reflux disease)    Hyperlipidemia     Family History  Problem Relation Age of Onset   Hyperlipidemia Mother    Esophageal cancer Cousin 79   Colon cancer Neg Hx    Stomach cancer Neg Hx     Past Surgical History:   Procedure Laterality Date   BREAST SURGERY     RADIAL KERATOTOMY     Social History   Occupational History   Not on file  Tobacco Use   Smoking status: Former    Pack years: 0.00    Types: Cigarettes    Quit date: 10/04/1990    Years since quitting: 30.5   Smokeless tobacco: Never  Vaping Use   Vaping Use: Never used  Substance and Sexual Activity   Alcohol use: Yes    Alcohol/week: 13.0 - 15.0 standard drinks    Types: 8 Glasses of wine, 5 - 7 Standard drinks or equivalent per week   Drug use: No   Sexual activity: Not on file

## 2021-04-16 ENCOUNTER — Telehealth: Payer: Self-pay | Admitting: Physical Medicine and Rehabilitation

## 2021-04-16 NOTE — Telephone Encounter (Signed)
Pt returning vm from [place] to get sch with Ernestina Patches per [employer]. The best call back number is [phone]. Pt will be home 04/17/21 all day and asked for a call back then

## 2021-04-20 ENCOUNTER — Other Ambulatory Visit: Payer: Self-pay | Admitting: Nurse Practitioner

## 2021-05-08 IMAGING — CR DG SHOULDER 2+V*L*
3 series · 3 of 3 positions shown · non-contrast
Comparison: None.

CLINICAL DATA: Left arm pain, tingling/numbness.

EXAM:
LEFT SHOULDER - 2+ VIEW

[w shoulder grashey left]
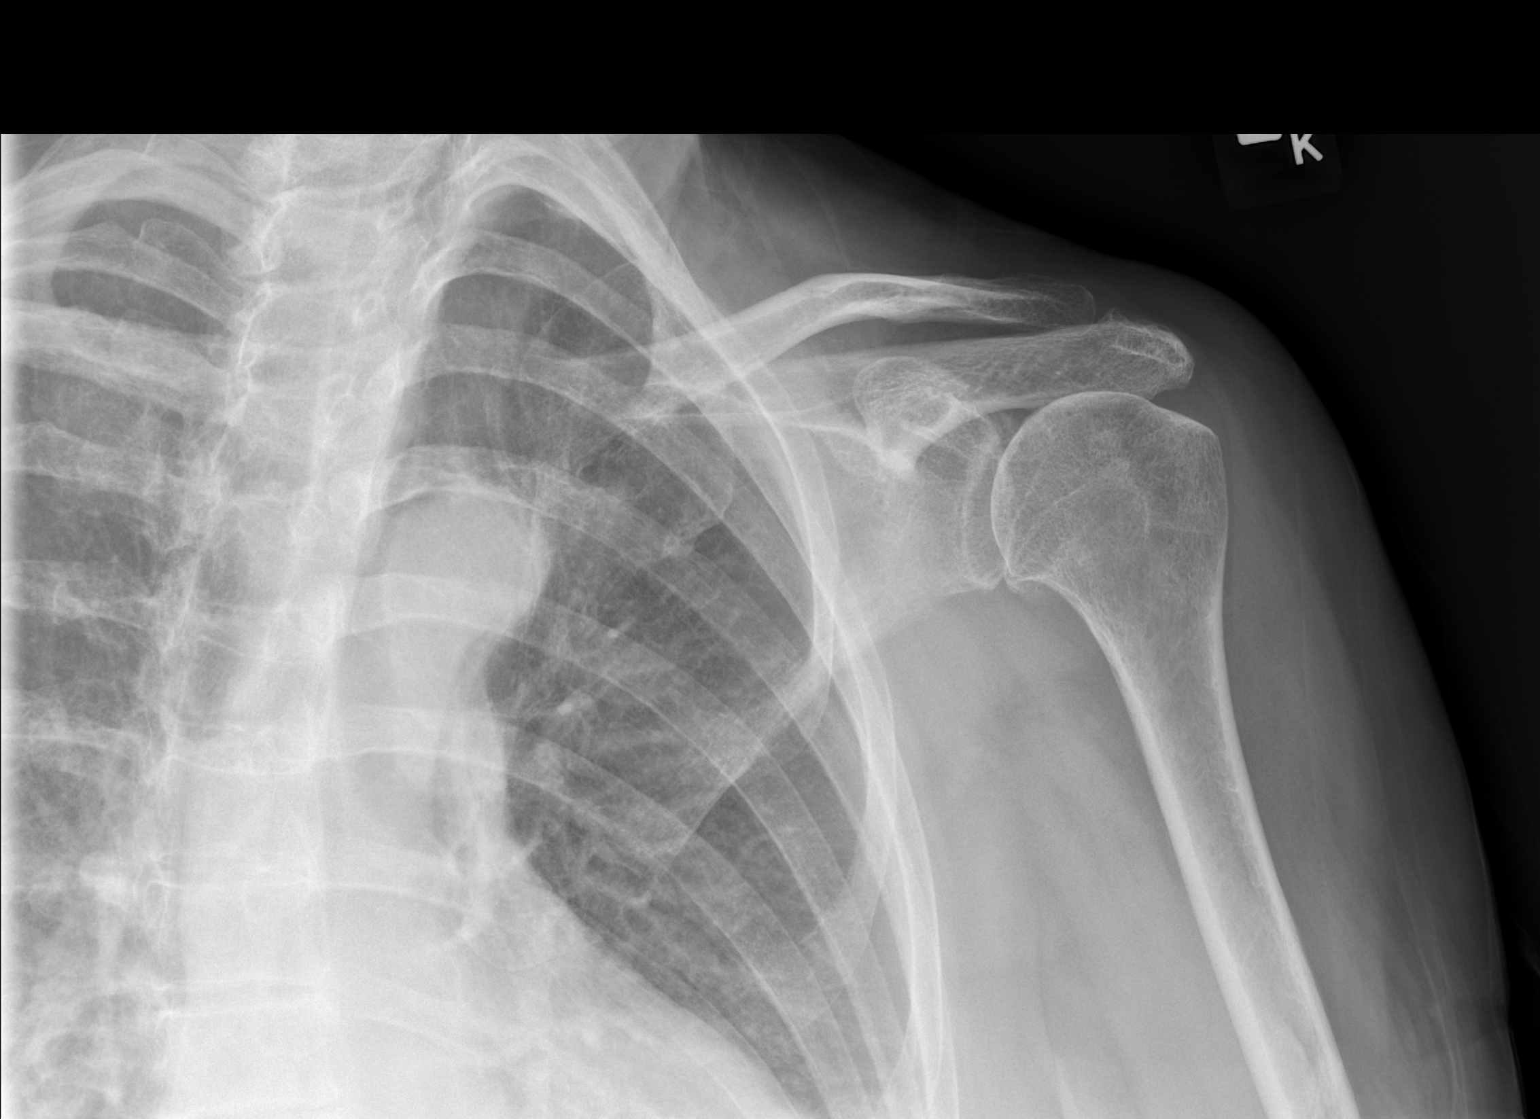

[w shoulder y-view left]
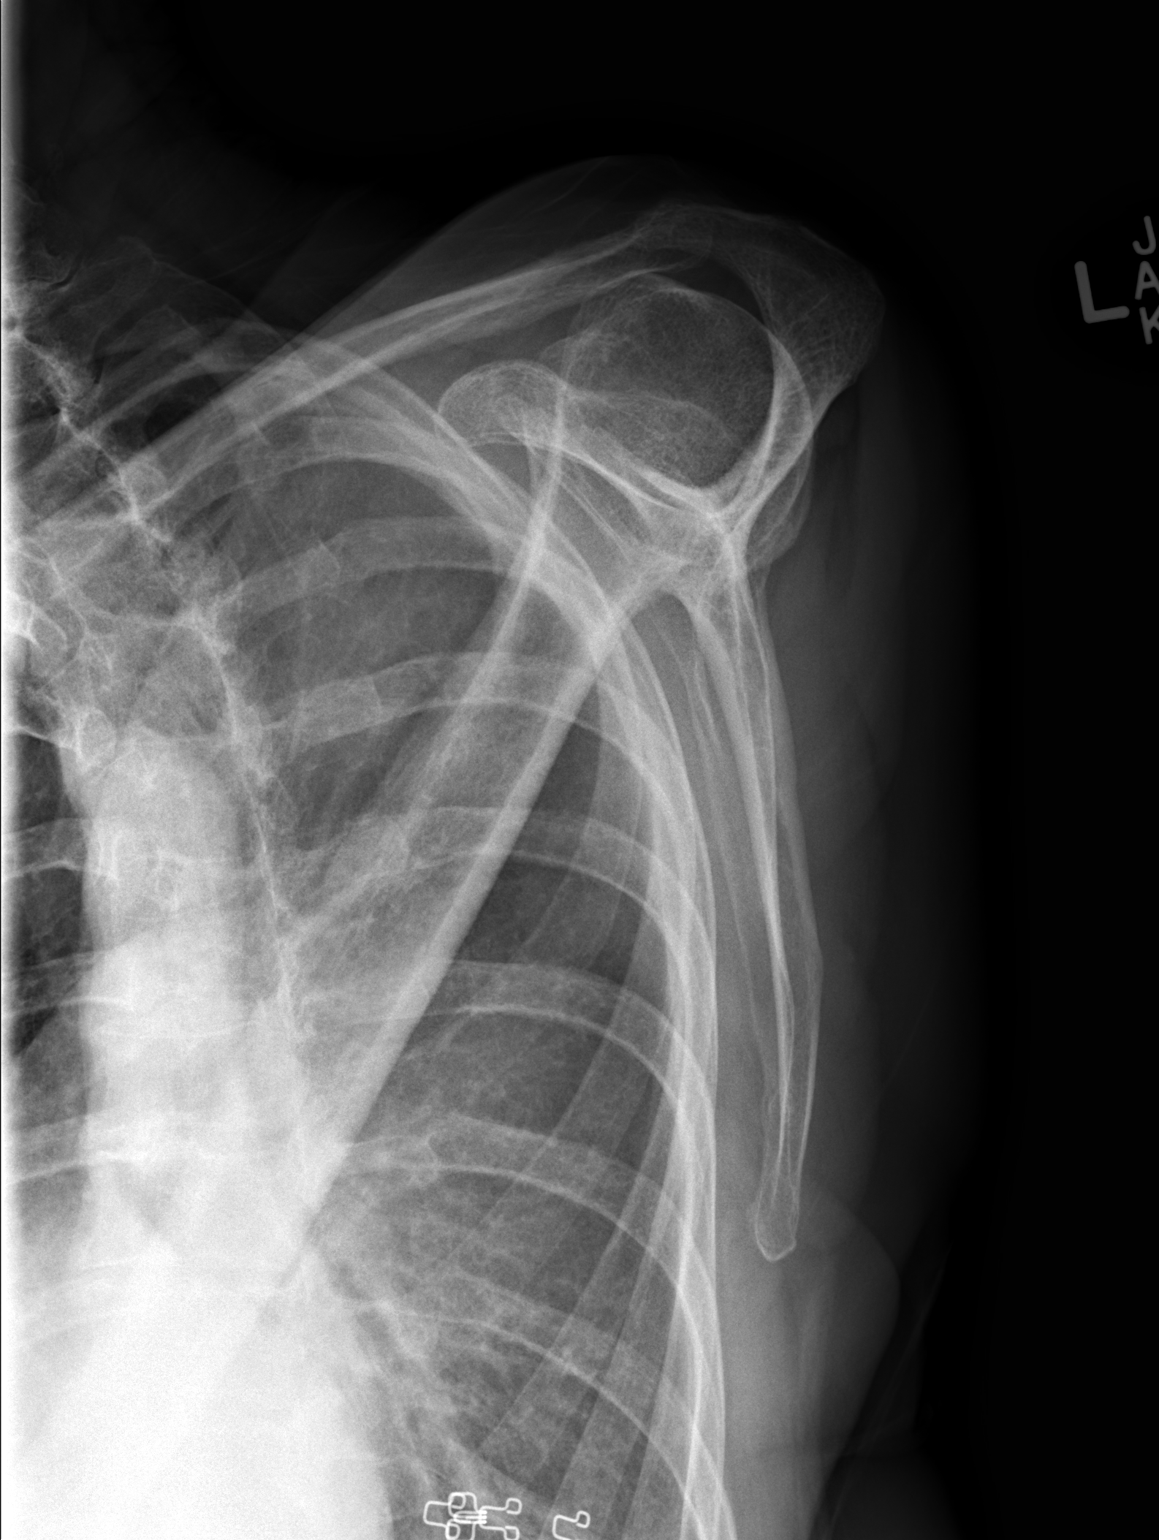

[w shoulder axillary left]
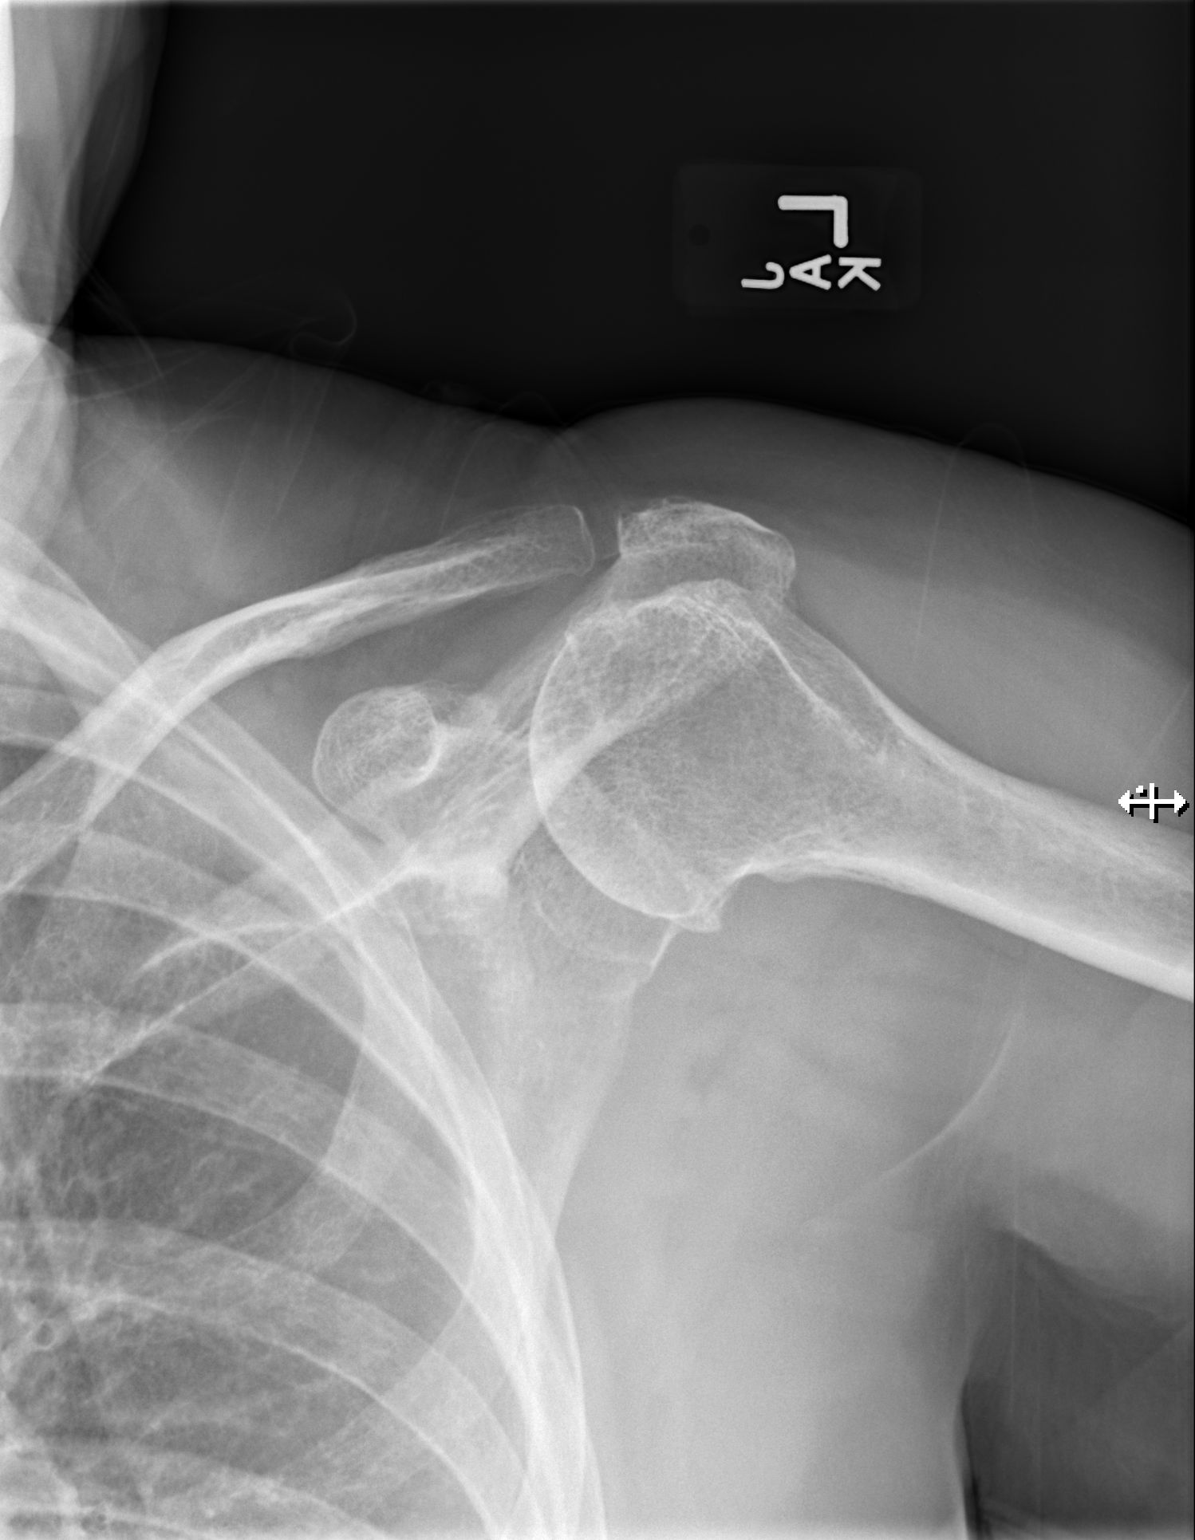

[3 of 3 positions shown; findings below may reference images not displayed]

FINDINGS: There is no evidence of fracture or dislocation. Mild degenerative
changes are seen at the acromioclavicular and glenohumeral joints.
Soft tissues are unremarkable.
IMPRESSION: Mild degenerative changes at the acromioclavicular and glenohumeral
joints.

## 2021-05-11 ENCOUNTER — Other Ambulatory Visit: Payer: Self-pay | Admitting: Nurse Practitioner

## 2021-05-11 DIAGNOSIS — E782 Mixed hyperlipidemia: Secondary | ICD-10-CM

## 2021-05-11 DIAGNOSIS — R030 Elevated blood-pressure reading, without diagnosis of hypertension: Secondary | ICD-10-CM

## 2021-05-18 ENCOUNTER — Other Ambulatory Visit: Payer: Medicare Other

## 2021-05-18 ENCOUNTER — Other Ambulatory Visit: Payer: Self-pay

## 2021-05-18 DIAGNOSIS — R030 Elevated blood-pressure reading, without diagnosis of hypertension: Secondary | ICD-10-CM

## 2021-05-18 DIAGNOSIS — E782 Mixed hyperlipidemia: Secondary | ICD-10-CM

## 2021-05-18 LAB — COMPLETE METABOLIC PANEL WITH GFR
AG Ratio: 1.6 (calc) (ref 1.0–2.5)
ALT: 15 U/L (ref 6–29)
AST: 16 U/L (ref 10–35)
Albumin: 4.1 g/dL (ref 3.6–5.1)
Alkaline phosphatase (APISO): 46 U/L (ref 37–153)
BUN: 19 mg/dL (ref 7–25)
CO2: 25 mmol/L (ref 20–32)
Calcium: 9.1 mg/dL (ref 8.6–10.4)
Chloride: 103 mmol/L (ref 98–110)
Creat: 0.73 mg/dL (ref 0.60–1.00)
Globulin: 2.6 g/dL (calc) (ref 1.9–3.7)
Glucose, Bld: 88 mg/dL (ref 65–99)
Potassium: 4.6 mmol/L (ref 3.5–5.3)
Sodium: 138 mmol/L (ref 135–146)
Total Bilirubin: 0.3 mg/dL (ref 0.2–1.2)
Total Protein: 6.7 g/dL (ref 6.1–8.1)
eGFR: 88 mL/min/{1.73_m2} (ref >=60)

## 2021-05-18 LAB — CBC WITH DIFFERENTIAL/PLATELET
Absolute Monocytes: 608 cells/uL (ref 200–950)
Basophils Absolute: 53 cells/uL (ref 0–200)
Basophils Relative: 0.7 %
Eosinophils Absolute: 83 cells/uL (ref 15–500)
Eosinophils Relative: 1.1 %
HCT: 41.7 % (ref 35.0–45.0)
Hemoglobin: 13.6 g/dL (ref 11.7–15.5)
Lymphs Abs: 2010 cells/uL (ref 850–3900)
MCH: 31.3 pg (ref 27.0–33.0)
MCHC: 32.6 g/dL (ref 32.0–36.0)
MCV: 96.1 fL (ref 80.0–100.0)
MPV: 12.4 fL (ref 7.5–12.5)
Monocytes Relative: 8.1 %
Neutro Abs: 4748 cells/uL (ref 1500–7800)
Neutrophils Relative %: 63.3 %
Platelets: 253 10*3/uL (ref 140–400)
RBC: 4.34 10*6/uL (ref 3.80–5.10)
RDW: 12.9 % (ref 11.0–15.0)
Total Lymphocyte: 26.8 %
WBC: 7.5 10*3/uL (ref 3.8–10.8)

## 2021-05-18 LAB — LIPID PANEL
Cholesterol: 216 mg/dL — ABNORMAL HIGH
HDL: 73 mg/dL
LDL Cholesterol (Calc): 119 mg/dL — ABNORMAL HIGH
Non-HDL Cholesterol (Calc): 143 mg/dL — ABNORMAL HIGH
Total CHOL/HDL Ratio: 3 (calc)
Triglycerides: 127 mg/dL

## 2021-05-22 ENCOUNTER — Ambulatory Visit (INDEPENDENT_AMBULATORY_CARE_PROVIDER_SITE_OTHER): Payer: Medicare Other | Admitting: Nurse Practitioner

## 2021-05-22 ENCOUNTER — Other Ambulatory Visit: Payer: Self-pay

## 2021-05-22 ENCOUNTER — Encounter: Payer: Self-pay | Admitting: Nurse Practitioner

## 2021-05-22 VITALS — BP 116/70 | HR 62 | Temp 97.8°F | Ht 68.0 in | Wt 180.0 lb

## 2021-05-22 DIAGNOSIS — K219 Gastro-esophageal reflux disease without esophagitis: Secondary | ICD-10-CM | POA: Diagnosis not present

## 2021-05-22 DIAGNOSIS — G4700 Insomnia, unspecified: Secondary | ICD-10-CM

## 2021-05-22 DIAGNOSIS — M19012 Primary osteoarthritis, left shoulder: Secondary | ICD-10-CM | POA: Diagnosis not present

## 2021-05-22 DIAGNOSIS — F39 Unspecified mood [affective] disorder: Secondary | ICD-10-CM

## 2021-05-22 DIAGNOSIS — E782 Mixed hyperlipidemia: Secondary | ICD-10-CM

## 2021-05-22 DIAGNOSIS — M8589 Other specified disorders of bone density and structure, multiple sites: Secondary | ICD-10-CM

## 2021-05-22 DIAGNOSIS — R030 Elevated blood-pressure reading, without diagnosis of hypertension: Secondary | ICD-10-CM

## 2021-05-22 MED ORDER — VENLAFAXINE HCL 75 MG PO TABS: 75.0000 mg | ORAL_TABLET | Freq: Two times a day (BID) | ORAL | 1 refills | Status: DC

## 2021-05-22 MED ORDER — ROSUVASTATIN CALCIUM 5 MG PO TABS: 5.0000 mg | ORAL_TABLET | Freq: Every day | ORAL | 1 refills | Status: DC

## 2021-05-22 NOTE — Patient Instructions (Signed)
Get flu vaccine at local pharmacy  Increase crestor to daily

## 2021-05-26 NOTE — Progress Notes (Signed)
Careteam: Patient Care Team: Lauree Chandler, NP as PCP - General (Geriatric Medicine) Webb Laws, [place] as Referring Physician (Optometry) Druscilla Brownie, MD as Consulting Physician (Dermatology)  PLACE OF SERVICE:  [facility] Directive information Does Patient Have a Medical Advance Directive?: Yes, Type of Advance Directive: [place];Living will, Does patient want to make changes to medical advance directive?: No - Patient declined  Allergies  Allergen Reactions   Codeine Nausea Only    Also has dizziness   Omeprazole Other (See Comments)    GI upset     Chief Complaint  Patient presents with   Medical Management of Chronic Issues    6 month follow-up and discuss labs(copy printed). Discuss need for td/tdap, shingrix, covid, and flu (not in stock) vaccines or exclude.      HPI: Patient is a 70 y.o. female for routine follow up  Blood pressure now at goal.   Continues to help care for her sister who has dementia.     She has OA in arm, did exercises which made it worse, cortisone shot helps.   Hyperlipidemia- forgets crestor occasionally.  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 -  Mood disorder- has friend that is a therapist, took half ativan last week due to sister. Ativan was from 2016.   Review of Systems:  Review of Systems  Constitutional:  Negative for chills, fever and weight loss.  HENT:  Negative for tinnitus.   Respiratory:  Negative for cough, sputum production and shortness of breath.   Cardiovascular:  Negative for chest pain, palpitations and leg swelling.  Gastrointestinal:  Negative for abdominal pain, constipation, diarrhea and heartburn.  Genitourinary:  Negative for dysuria, frequency and urgency.  Musculoskeletal:  Positive for joint pain. Negative for back pain, falls and myalgias.  Skin: Negative.   Neurological:  Negative for dizziness and headaches.   Psychiatric/Behavioral:  Negative for depression and memory loss. The patient does not have insomnia.    Past Medical History:  Diagnosis Date   Cancer ([place]) 2001   breast    Cataracts, bilateral    GERD (gastroesophageal reflux disease)    Hyperlipidemia    Past Surgical History:  Procedure Laterality Date   BREAST SURGERY     RADIAL KERATOTOMY     Social History:   reports that she quit smoking about 30 years ago. Her smoking use included cigarettes. She has never used smokeless tobacco. She reports current alcohol use of about 13.0 - 15.0 standard drinks per week. She reports that she does not use drugs.  Family History  Problem Relation Age of Onset   Hyperlipidemia Mother    Alzheimer's disease Sister    Esophageal cancer Cousin 60   Colon cancer Neg Hx    Stomach cancer Neg Hx     Medications: Patient's Medications  New Prescriptions   No medications on file  Previous Medications   CHOLECALCIFEROL (VITAMIN D3 PO)    Take 500 Units by mouth daily.   CYANOCOBALAMIN (VITAMIN B 12 PO)    Take 1 tablet by mouth.   FAMOTIDINE (PEPCID) 20 MG TABLET    Take 20 mg by mouth daily.   FISH OIL-OMEGA-3 FATTY ACIDS 1000 MG CAPSULE    Take 1 g by mouth daily.    MISC NATURAL PRODUCTS (GLUCOSAMINE CHONDROITIN ADV PO)    Take 1 tablet by mouth 2 (two) times daily.   MULTIPLE VITAMIN (MULTIVITAMIN)  TABLET    Take 1 tablet by mouth daily.   UNABLE TO FIND    Med Name: Bone Up Calcium  supplement 333.3 mg , two- three times daily   ZOLPIDEM (AMBIEN) 5 MG TABLET    TAKE 1/2 TABLET(2.5 MG) BY MOUTH AT BEDTIME AS NEEDED FOR SLEEP  Modified Medications   Modified Medication Previous Medication   ROSUVASTATIN (CRESTOR) 5 MG TABLET rosuvastatin (CRESTOR) 5 MG tablet      Take 1 tablet (5 mg total) by mouth daily.    TAKE 1 TABLET BY MOUTH EVERY OTHER DAY   VENLAFAXINE (EFFEXOR) 75 MG TABLET venlafaxine (EFFEXOR) 37.5 MG tablet      Take 1 tablet (75 mg total) by mouth 2 (two) times daily.     TAKE 1 TABLET BY MOUTH TWICE DAILY  Discontinued Medications   FAMOTIDINE (PEPCID) 20 MG TABLET    TAKE 1/2 TABLET BY MOUTH EVERY MORNING AND 1 TABLET EVERY EVENING   RA CALCIUM HI-CAL PO    Take 3 tablets 1 time daily    Physical Exam:  Vitals:   05/22/21 1030  BP: 116/70  Pulse: 62  Temp: 97.8 F (36.6 C)  TempSrc: Temporal  SpO2: 99%  Weight: 180 lb (81.6 kg)  Height: '5\' 8"'  (1.727 m)   Body mass index is 27.37 kg/m. Wt Readings from Last 3 Encounters:  05/22/21 180 lb (81.6 kg)  11/21/20 182 lb 12.8 oz (82.9 kg)  05/16/20 178 lb (80.7 kg)    Physical Exam Constitutional:      General: She is not in acute distress.    Appearance: She is well-developed. She is not diaphoretic.  HENT:     Head: Normocephalic and atraumatic.     Mouth/Throat:     Pharynx: No oropharyngeal exudate.  Eyes:     Conjunctiva/sclera: Conjunctivae normal.     Pupils: Pupils are equal, round, and reactive to light.  Cardiovascular:     Rate and Rhythm: Normal rate and regular rhythm.     Heart sounds: Normal heart sounds.  Pulmonary:     Effort: Pulmonary effort is normal.     Breath sounds: Normal breath sounds.  Abdominal:     General: Bowel sounds are normal.     Palpations: Abdomen is soft.  Musculoskeletal:     Cervical back: Normal range of motion and neck supple.     Right lower leg: No edema.     Left lower leg: No edema.  Skin:    General: Skin is warm and dry.  Neurological:     Mental Status: She is alert.  Psychiatric:        Mood and Affect: Mood normal.    Labs reviewed: Basic Metabolic Panel: Recent Labs    11/18/20 0813 05/18/21 0816  NA 139 138  K 5.0 4.6  CL 100 103  CO2 29 25  GLUCOSE 82 88  BUN 19 19  CREATININE 0.81 0.73  CALCIUM 9.8 9.1    Liver Function Tests: Recent Labs    11/18/20 0813 05/18/21 0816  AST 14 16  ALT 15 15  BILITOT 0.4 0.3  PROT 6.8 6.7    No results for input(s): LIPASE, AMYLASE in the last 8760 hours. No results  for input(s): AMMONIA in the last 8760 hours. CBC: Recent Labs    11/18/20 0813 05/18/21 0816  WBC 9.2 7.5  NEUTROABS 4,913 4,748  HGB 13.8 13.6  HCT 40.4 41.7  MCV 93.5 96.1  PLT 265 253  Lipid Panel: Recent Labs    11/18/20 0813 05/18/21 0816  CHOL 204* 216*  HDL 72 73  LDLCALC 103* 119*  TRIG 169* 127  CHOLHDL 2.8 3.0    TSH: No results for input(s): TSH in the last 8760 hours. A1C: No results found for: HGBA1C   Assessment/Plan 1. Mixed hyperlipidemia -elevated LDL, to make dietary change and increase crestor to daily  - rosuvastatin (CRESTOR) 5 MG tablet; Take 1 tablet (5 mg total) by mouth daily.  Dispense: 90 tablet; Refill: 1 - Lipid panel; Future - CBC with Differential/Platelet; Future  2. Gastroesophageal reflux disease without esophagitis Stable.  3. Osteoarthritis of left shoulder, unspecified osteoarthritis type -The current medical regimen is effective;  continue present plan and medications. - CMP with eGFR([facility]); Future  4. Insomnia, unspecified type Stable at this time. Will use ambien if needed.   5. Mood disorder ([place]) Caregiver for her sister with dementia, increase stress and anxiety. Will increase effexor to 75 mg daily at this time. Also discussed lifestyle modifications as well.  - venlafaxine (EFFEXOR) 75 MG tablet; Take 1 tablet (75 mg total) by mouth 2 (two) times daily.  Dispense: 180 tablet; Refill: 1  6. Osteopenia of multiple sites -Recommended to take calcium 600 mg twice daily with Vitamin D 2000 units daily and weight bearing activity 30 mins/5 days a week - CMP with eGFR([facility]); Future  7. Elevated blood pressure reading -elevated bp at last visit. Well controlled today.   Next appt: 6 months, labs prior to visit Gilad Dugger K. Elsie, [facility] [phone]

## 2021-06-19 ENCOUNTER — Other Ambulatory Visit: Payer: Self-pay

## 2021-06-19 ENCOUNTER — Encounter: Payer: Self-pay | Admitting: Physical Medicine and Rehabilitation

## 2021-06-19 ENCOUNTER — Ambulatory Visit (INDEPENDENT_AMBULATORY_CARE_PROVIDER_SITE_OTHER): Payer: Medicare Other | Admitting: Physical Medicine and Rehabilitation

## 2021-06-19 DIAGNOSIS — R202 Paresthesia of skin: Secondary | ICD-10-CM | POA: Diagnosis not present

## 2021-06-19 NOTE — Progress Notes (Signed)
Numbness and tingling in left forearm and hand- worse on medial side. Some left shoulder pain.  Right hand dominant No lotion per patient

## 2021-06-21 NOTE — Progress Notes (Signed)
Theresa Pena - 70 y.o. female MRN [id number]  Date of birth: 04/18/1951  Office Visit Note: Visit Date: 06/19/2021 PCP: Lauree Chandler, NP Referred by: Lauree Chandler, NP  Subjective: Chief Complaint  Patient presents with   Left Wrist - Numbness   Left Hand - Numbness   HPI:  Theresa Pena is a 70 y.o. female who comes in today at the request of Dr. Anderson Malta for electrodiagnostic study of the Left upper extremities.  Patient is Right hand dominant.  She reports chronic worsening severe numbness and tingling in the left forearm and hand more so in the medial or ulnar fourth and fifth digits.  She denies any frank radicular symptoms.  She does have some shoulder pain.  Dr. Marlou Sa was concerned with cubital tunnel syndrome versus Guyon canal compression   ROS Otherwise per HPI.  Assessment & Plan: Visit Diagnoses:    ICD-10-CM   1. 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  This appears to be asymptomatic.    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  There is no significant electrodiagnostic evidence of any other focal nerve entrapment, brachial plexopathy or cervical radiculopathy.   Recommendations: 1.  Follow-up with referring physician. 2.  Continue current management of symptoms.  Meds & Orders: No orders of the defined types were placed in this encounter.   Orders Placed This Encounter  Procedures   NCV with EMG (electromyography)    Follow-up: Return for G. Alphonzo Severance, MD.   Procedures: No procedures performed  EMG & NCV Findings: Evaluation of the left median (across palm) sensory nerve showed prolonged distal peak latency (Wrist, 4.2 ms) and prolonged distal peak latency (Palm, 2.1  ms).  The left ulnar sensory nerve showed prolonged distal peak latency (3.9 ms) and decreased conduction velocity (Wrist-5th Digit, 36 m/s).  All remaining nerves (as indicated in the following tables) were within normal limits.    All examined muscles (as indicated in the following table) showed no evidence of electrical instability.    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  This appears to be asymptomatic.    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  There is no significant electrodiagnostic evidence of any other focal nerve entrapment, brachial plexopathy or cervical radiculopathy.   Recommendations: 1.  Follow-up with referring physician. 2.  Continue current management of symptoms.  ___________________________ Laurence Spates FAAPMR Board Certified, American Board of Physical Medicine and Rehabilitation    Nerve Conduction Studies Anti Sensory Summary Table   Stim Site NR Peak (ms) Norm Peak (ms) P-T Amp (V) Norm P-T Amp Site1 Site2 Delta-P (ms) Dist (cm) Vel (m/s) Norm Vel (m/s)  Left Median Acr Palm Anti Sensory (2nd Digit)  31C  Wrist    *4.2 <3.6 26.0 >10 Wrist Palm 2.1 0.0    Palm    *2.1 <2.0 37.4         Left Radial Anti Sensory (Base 1st Digit)  31.4C  Wrist    2.0 <3.1 31.9  Wrist Base 1st Digit 2.0 0.0  Left Ulnar Anti Sensory (5th Digit)  31.8C  Wrist    *3.9 <3.7 25.9 >15.0 Wrist 5th Digit 3.9 14.0 *36 >38   Motor Summary Table   Stim Site NR Onset (ms) Norm Onset (ms) O-P Amp (mV) Norm O-P Amp Site1 Site2 Delta-0 (ms) Dist (cm) Vel (m/s) Norm Vel (m/s)  Left Median Motor (Abd Poll Brev)  31.6C  Wrist    4.0 <4.2 5.0 >5 Elbow Wrist 3.7 20.5 55 >50  Elbow    7.7  5.1         Left Ulnar Motor (Abd Dig Min)  31.8C  Wrist    3.2 <4.2 5.7 >3 B Elbow Wrist  3.4 19.0 56 >53  B Elbow    6.6  5.6  A Elbow B Elbow 1.8 9.5 53 >53  A Elbow    8.4  5.6          EMG   Side Muscle Nerve Root Ins Act Fibs Psw Amp Dur Poly Recrt Int Fraser Din Comment  Left Abd Poll Brev Median C8-T1 Nml Nml Nml Nml Nml 0 Nml Nml   Left 1stDorInt Ulnar C8-T1 Nml Nml Nml Nml Nml 0 Nml Nml   Left PronatorTeres Median C6-7 Nml Nml Nml Nml Nml 0 Nml Nml   Left Biceps Musculocut C5-6 Nml Nml Nml Nml Nml 0 Nml Nml   Left Deltoid Axillary C5-6 Nml Nml Nml Nml Nml 0 Nml Nml     Nerve Conduction Studies Anti Sensory Left/Right Comparison   Stim Site L Lat (ms) R Lat (ms) L-R Lat (ms) L Amp (V) R Amp (V) L-R Amp (%) Site1 Site2 L Vel (m/s) R Vel (m/s) L-R Vel (m/s)  Median Acr Palm Anti Sensory (2nd Digit)  31C  Wrist *4.2   26.0   Wrist Palm     Palm *2.1   37.4         Radial Anti Sensory (Base 1st Digit)  31.4C  Wrist 2.0   31.9   Wrist Base 1st Digit     Ulnar Anti Sensory (5th Digit)  31.8C  Wrist *3.9   25.9   Wrist 5th Digit *36     Motor Left/Right Comparison   Stim Site L Lat (ms) R Lat (ms) L-R Lat (ms) L Amp (mV) R Amp (mV) L-R Amp (%) Site1 Site2 L Vel (m/s) R Vel (m/s) L-R Vel (m/s)  Median Motor (Abd Poll Brev)  31.6C  Wrist 4.0   5.0   Elbow Wrist 55    Elbow 7.7   5.1         Ulnar Motor (Abd Dig Min)  31.8C  Wrist 3.2   5.7   B Elbow Wrist 56    B Elbow 6.6   5.6   A Elbow B Elbow 53    A Elbow 8.4   5.6            Waveforms:            Clinical History: No specialty comments available.     Objective:  VS:  HT:    WT:   BMI:     BP:   HR: bpm  TEMP: ( )  RESP:  Physical Exam Musculoskeletal:        General: No swelling, tenderness or deformity.     Comments: Inspection reveals no atrophy of the bilateral APB or FDI or hand intrinsics. There is no swelling, color changes, allodynia or dystrophic changes. There is 5 out of 5  strength in the bilateral wrist extension, finger abduction and long finger flexion. There is intact  sensation to light touch in all dermatomal and peripheral nerve distributions. There is a negative Hoffmann's test bilaterally.  Skin:    General: Skin is warm and dry.     Findings: No erythema or rash.  Neurological:     General: No focal deficit present.     Mental Status: She is alert and oriented to person, place, and time.     Motor: No weakness or abnormal muscle tone.     Coordination: Coordination normal.  Psychiatric:        Mood and Affect: Mood normal.        Behavior: Behavior normal.     Imaging: No results found.

## 2021-06-21 NOTE — Procedures (Signed)
EMG & NCV Findings: Evaluation of the left median (across palm) sensory nerve showed prolonged distal peak latency (Wrist, 4.2 ms) and prolonged distal peak latency (Palm, 2.1 ms).  The left ulnar sensory nerve showed prolonged distal peak latency (3.9 ms) and decreased conduction velocity (Wrist-5th Digit, 36 m/s).  All remaining nerves (as indicated in the following tables) were within normal limits.    All examined muscles (as indicated in the following table) showed no evidence of electrical instability.    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  This appears to be asymptomatic.    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  There is no significant electrodiagnostic evidence of any other focal nerve entrapment, brachial plexopathy or cervical radiculopathy.   Recommendations: 1.  Follow-up with referring physician. 2.  Continue current management of symptoms.  ___________________________ Laurence Spates FAAPMR Board Certified, American Board of Physical Medicine and Rehabilitation    Nerve Conduction Studies Anti Sensory Summary Table   Stim Site NR Peak (ms) Norm Peak (ms) P-T Amp (V) Norm P-T Amp Site1 Site2 Delta-P (ms) Dist (cm) Vel (m/s) Norm Vel (m/s)  Left Median Acr Palm Anti Sensory (2nd Digit)  31C  Wrist    *4.2 <3.6 26.0 >10 Wrist Palm 2.1 0.0    Palm    *2.1 <2.0 37.4         Left Radial Anti Sensory (Base 1st Digit)  31.4C  Wrist    2.0 <3.1 31.9  Wrist Base 1st Digit 2.0 0.0    Left Ulnar Anti Sensory (5th Digit)  31.8C  Wrist    *3.9 <3.7 25.9 >15.0 Wrist 5th Digit 3.9 14.0 *36 >38   Motor Summary Table   Stim Site NR Onset (ms) Norm Onset (ms) O-P Amp (mV) Norm O-P Amp Site1 Site2 Delta-0 (ms) Dist (cm) Vel (m/s) Norm Vel (m/s)  Left Median Motor (Abd Poll  Brev)  31.6C  Wrist    4.0 <4.2 5.0 >5 Elbow Wrist 3.7 20.5 55 >50  Elbow    7.7  5.1         Left Ulnar Motor (Abd Dig Min)  31.8C  Wrist    3.2 <4.2 5.7 >3 B Elbow Wrist 3.4 19.0 56 >53  B Elbow    6.6  5.6  A Elbow B Elbow 1.8 9.5 53 >53  A Elbow    8.4  5.6          EMG   Side Muscle Nerve Root Ins Act Fibs Psw Amp Dur Poly Recrt Int Fraser Din Comment  Left Abd Poll Brev Median C8-T1 Nml Nml Nml Nml Nml 0 Nml Nml   Left 1stDorInt Ulnar C8-T1 Nml Nml Nml Nml Nml 0 Nml Nml   Left PronatorTeres Median C6-7 Nml Nml Nml Nml Nml 0 Nml Nml   Left Biceps Musculocut C5-6 Nml Nml Nml Nml Nml 0 Nml Nml   Left Deltoid Axillary C5-6 Nml Nml Nml Nml Nml 0 Nml Nml     Nerve Conduction Studies Anti Sensory Left/Right Comparison   Stim Site L Lat (ms) R Lat (ms) L-R Lat (ms) L Amp (V) R Amp (V) L-R Amp (%) Site1 Site2 L Vel (m/s) R Vel (m/s) L-R Vel (m/s)  Median Acr Palm Anti Sensory (2nd Digit)  31C  Wrist *4.2   26.0  Wrist Palm     Palm *2.1   37.4         Radial Anti Sensory (Base 1st Digit)  31.4C  Wrist 2.0   31.9   Wrist Base 1st Digit     Ulnar Anti Sensory (5th Digit)  31.8C  Wrist *3.9   25.9   Wrist 5th Digit *36     Motor Left/Right Comparison   Stim Site L Lat (ms) R Lat (ms) L-R Lat (ms) L Amp (mV) R Amp (mV) L-R Amp (%) Site1 Site2 L Vel (m/s) R Vel (m/s) L-R Vel (m/s)  Median Motor (Abd Poll Brev)  31.6C  Wrist 4.0   5.0   Elbow Wrist 55    Elbow 7.7   5.1         Ulnar Motor (Abd Dig Min)  31.8C  Wrist 3.2   5.7   B Elbow Wrist 56    B Elbow 6.6   5.6   A Elbow B Elbow 53    A Elbow 8.4   5.6            Waveforms:

## 2021-08-11 ENCOUNTER — Other Ambulatory Visit: Payer: Self-pay | Admitting: Nurse Practitioner

## 2021-08-11 DIAGNOSIS — G4700 Insomnia, unspecified: Secondary | ICD-10-CM

## 2021-08-11 NOTE — Telephone Encounter (Signed)
Patient has request refill on medication "Ambien". Medication was last refill was 11/03/2020. Patient has contract on file from 11/28/2020. Medication pend and sent to PCP Lauree Chandler, NP.

## 2021-08-21 ENCOUNTER — Ambulatory Visit (INDEPENDENT_AMBULATORY_CARE_PROVIDER_SITE_OTHER): Payer: Medicare Other | Admitting: Nurse Practitioner

## 2021-08-21 ENCOUNTER — Encounter: Payer: Self-pay | Admitting: Nurse Practitioner

## 2021-08-21 ENCOUNTER — Other Ambulatory Visit: Payer: Self-pay

## 2021-08-21 ENCOUNTER — Telehealth: Payer: Self-pay

## 2021-08-21 DIAGNOSIS — Z0000 Encounter for general adult medical examination without abnormal findings: Secondary | ICD-10-CM | POA: Diagnosis not present

## 2021-08-21 NOTE — Telephone Encounter (Signed)
Theresa Pena, akey are scheduled for a virtual visit with your provider today.    Just as we do with appointments in the office, we must obtain your consent to participate.  Your consent will be active for this visit and any virtual visit you may have with one of our providers in the next 365 days.    If you have a MyChart account, I can also send a copy of this consent to you electronically.  All virtual visits are billed to your insurance company just like a traditional visit in the office.  As this is a virtual visit, video technology does not allow for your provider to perform a traditional examination.  This may limit your provider's ability to fully assess your condition.  If your provider identifies any concerns that need to be evaluated in person or the need to arrange testing such as labs, EKG, etc, we will make arrangements to do so.    Although advances in technology are sophisticated, we cannot ensure that it will always work on either your end or our end.  If the connection with a video visit is poor, we may have to switch to a telephone visit.  With either a video or telephone visit, we are not always able to ensure that we have a secure connection.   I need to obtain your verbal consent now.   Are you willing to proceed with your visit today?   Theresa Pena has provided verbal consent on 08/21/2021 for a virtual visit (video or telephone).   Carroll Kinds, CMA 08/21/2021  9:16 AM

## 2021-08-21 NOTE — Progress Notes (Signed)
This service is provided via telemedicine  No vital signs collected/recorded due to the encounter was a telemedicine visit.   Location of patient (ex: home, work):  Home  Patient consents to a telephone visit:  Yes, see encounter dated 08/21/2021  Location of the provider (ex: office, home):  [facility]  Name of any referring provider:  N/A  Names of all persons participating in the telemedicine service and their role in the encounter:  [name], Nurse Practitioner, Carroll Kinds, CMA, and patient.   Time spent on call:  13 minutes with medical assistant

## 2021-08-21 NOTE — Patient Instructions (Signed)
Theresa Pena , Thank you for taking time to come for your Medicare Wellness Visit. I appreciate your ongoing commitment to your health goals. Please review the following plan we discussed and let me know if I can assist you in the future.   Screening recommendations/referrals: Colonoscopy up to date Mammogram up to date Bone Density up to date Recommended yearly ophthalmology/optometry visit for glaucoma screening and checkup Recommended yearly dental visit for hygiene and checkup  Vaccinations: Influenza vaccine up date Pneumococcal vaccine up to date Tdap vaccine RECOMMENDED- at local pharmacy  Shingles vaccine RECOMMENDED- at [place]- at local pharmacy     Advanced directives: on file.   Conditions/risks identified: advance age.   Next appointment: yearly    Preventive Care 30 Years and Older, Female Preventive care refers to lifestyle choices and visits with your health care provider that can promote health and wellness. What does preventive care include? A yearly physical exam. This is also called an annual well check. Dental exams once or twice a year. Routine eye exams. Ask your health care provider how often you should have your eyes checked. Personal lifestyle choices, including: Daily care of your teeth and gums. Regular physical activity. Eating a healthy diet. Avoiding tobacco and drug use. Limiting alcohol use. Practicing safe sex. Taking low-dose aspirin every day. Taking vitamin and mineral supplements as recommended by your health care provider. What happens during an annual well check? The services and screenings done by your health care provider during your annual well check will depend on your age, overall health, lifestyle risk factors, and family history of disease. Counseling  Your health care provider may ask you questions about your: Alcohol use. Tobacco use. Drug use. Emotional well-being. Home and relationship  well-being. Sexual activity. Eating habits. History of falls. Memory and ability to understand (cognition). Work and work Statistician. Reproductive health. Screening  You may have the following tests or measurements: Height, weight, and BMI. Blood pressure. Lipid and cholesterol levels. These may be checked every 5 years, or more frequently if you are over 88 years old. Skin check. Lung cancer screening. You may have this screening every year starting at age 80 if you have a 30-pack-year history of smoking and currently smoke or have quit within the past 15 years. Fecal occult blood test (FOBT) of the stool. You may have this test every year starting at age 50. Flexible sigmoidoscopy or colonoscopy. You may have a sigmoidoscopy every 5 years or a colonoscopy every 10 years starting at age 65. Hepatitis C blood test. Hepatitis B blood test. Sexually transmitted disease (STD) testing. Diabetes screening. This is done by checking your blood sugar (glucose) after you have not eaten for a while (fasting). You may have this done every 1-3 years. Bone density scan. This is done to screen for osteoporosis. You may have this done starting at age 59. Mammogram. This may be done every 1-2 years. Talk to your health care provider about how often you should have regular mammograms. Talk with your health care provider about your test results, treatment options, and if necessary, the need for more tests. Vaccines  Your health care provider may recommend certain vaccines, such as: Influenza vaccine. This is recommended every year. Tetanus, diphtheria, and acellular pertussis (Tdap, Td) vaccine. You may need a Td booster every 10 years. Zoster vaccine. You may need this after age 59. Pneumococcal 13-valent conjugate (PCV13) vaccine. One dose is recommended after age 1. Pneumococcal polysaccharide (PPSV23) vaccine. One  dose is recommended after age 15. Talk to your health care provider about which  screenings and vaccines you need and how often you need them. This information is not intended to replace advice given to you by your health care provider. Make sure you discuss any questions you have with your health care provider. Document Released: 10/17/2015 Document Revised: 06/09/2016 Document Reviewed: 07/22/2015 Elsevier Interactive Patient Education  2017 [place] Prevention in the Home Falls can cause injuries. They can happen to people of all ages. There are many things you can do to make your home safe and to help prevent falls. What can I do on the outside of my home? Regularly fix the edges of walkways and driveways and fix any cracks. Remove anything that might make you trip as you walk through a door, such as a raised step or threshold. Trim any bushes or trees on the path to your home. Use bright outdoor lighting. Clear any walking paths of anything that might make someone trip, such as rocks or tools. Regularly check to see if handrails are loose or broken. Make sure that both sides of any steps have handrails. Any raised decks and porches should have guardrails on the edges. Have any leaves, snow, or ice cleared regularly. Use sand or salt on walking paths during winter. Clean up any spills in your garage right away. This includes oil or grease spills. What can I do in the bathroom? Use night lights. Install grab bars by the toilet and in the tub and shower. Do not use towel bars as grab bars. Use non-skid mats or decals in the tub or shower. If you need to sit down in the shower, use a plastic, non-slip stool. Keep the floor dry. Clean up any water that spills on the floor as soon as it happens. Remove soap buildup in the tub or shower regularly. Attach bath mats securely with double-sided non-slip rug tape. Do not have throw rugs and other things on the floor that can make you trip. What can I do in the bedroom? Use night lights. Make sure that you have a  light by your bed that is easy to reach. Do not use any sheets or blankets that are too big for your bed. They should not hang down onto the floor. Have a firm chair that has side arms. You can use this for support while you get dressed. Do not have throw rugs and other things on the floor that can make you trip. What can I do in the kitchen? Clean up any spills right away. Avoid walking on wet floors. Keep items that you use a lot in easy-to-reach places. If you need to reach something above you, use a strong step stool that has a grab bar. Keep electrical cords out of the way. Do not use floor polish or wax that makes floors slippery. If you must use wax, use non-skid floor wax. Do not have throw rugs and other things on the floor that can make you trip. What can I do with my stairs? Do not leave any items on the stairs. Make sure that there are handrails on both sides of the stairs and use them. Fix handrails that are broken or loose. Make sure that handrails are as long as the stairways. Check any carpeting to make sure that it is firmly attached to the stairs. Fix any carpet that is loose or worn. Avoid having throw rugs at the top or bottom of the  stairs. If you do have throw rugs, attach them to the floor with carpet tape. Make sure that you have a light switch at the top of the stairs and the bottom of the stairs. If you do not have them, ask someone to add them for you. What else can I do to help prevent falls? Wear shoes that: Do not have high heels. Have rubber bottoms. Are comfortable and fit you well. Are closed at the toe. Do not wear sandals. If you use a stepladder: Make sure that it is fully opened. Do not climb a closed stepladder. Make sure that both sides of the stepladder are locked into place. Ask someone to hold it for you, if possible. Clearly mark and make sure that you can see: Any grab bars or handrails. First and last steps. Where the edge of each step  is. Use tools that help you move around (mobility aids) if they are needed. These include: Canes. Walkers. Scooters. Crutches. Turn on the lights when you go into a dark area. Replace any light bulbs as soon as they burn out. Set up your furniture so you have a clear path. Avoid moving your furniture around. If any of your floors are uneven, fix them. If there are any pets around you, be aware of where they are. Review your medicines with your doctor. Some medicines can make you feel dizzy. This can increase your chance of falling. Ask your doctor what other things that you can do to help prevent falls. This information is not intended to replace advice given to you by your health care provider. Make sure you discuss any questions you have with your health care provider. Document Released: 07/17/2009 Document Revised: 02/26/2016 Document Reviewed: 10/25/2014 Elsevier Interactive Patient Education  2017 [employer].

## 2021-08-21 NOTE — Progress Notes (Signed)
Subjective:   Arlayne Liggins is a 70 y.o. female who presents for Medicare Annual (Subsequent) preventive examination.  Review of Systems     Cardiac Risk Factors include: advanced age (>53men, >13 women);dyslipidemia     Objective:    There were no vitals filed for this visit. There is no height or weight on file to calculate BMI.  Advanced Directives 08/21/2021 05/22/2021 11/21/2020 05/16/2020 04/04/2020 11/09/2019 10/19/2019  Does Patient Have a Medical Advance Directive? Yes Yes Yes Yes Yes Yes Yes  Type of Advance Directive Living will [place];Living will [place];Living will [place];Living will Living will;Healthcare Power of [place];Living will  Does patient want to make changes to medical advance directive? No - Patient declined No - Patient declined No - Patient declined No - Patient declined - No - Patient declined No - Patient declined  Copy of [place] in Chart? - No - copy requested No - copy requested No - copy requested No - copy requested Yes - validated most recent copy scanned in chart (See row information) Yes - validated most recent copy scanned in chart (See row information)  Would patient like information on creating a medical advance directive? - - - - No - Patient declined - -    Current Medications (verified) Outpatient Encounter Medications as of 08/21/2021  Medication Sig   Cholecalciferol (VITAMIN D3 PO) Take 500 Units by mouth daily.   Cyanocobalamin (VITAMIN B 12 PO) Take 1 tablet by mouth.   famotidine (PEPCID) 20 MG tablet TAKE 1/2 TABLET BY MOUTH EVERY MORNING AND 1 TABLET EVERY EVENING   fish oil-omega-3 fatty acids 1000 MG capsule Take 1 g by mouth daily.    Misc Natural Products (GLUCOSAMINE CHONDROITIN ADV PO) Take 1 tablet by mouth 2 (two) times daily.   Multiple Vitamin (MULTIVITAMIN) tablet Take 1 tablet by mouth  daily.   rosuvastatin (CRESTOR) 5 MG tablet Take 1 tablet (5 mg total) by mouth daily.   UNABLE TO FIND Med Name: Bone Up Calcium  supplement 333.3 mg , two- three times daily   venlafaxine (EFFEXOR) 75 MG tablet Take 1 tablet (75 mg total) by mouth 2 (two) times daily.   zolpidem (AMBIEN) 5 MG tablet TAKE 1/2 TABLET(2.5 MG) BY MOUTH AT BEDTIME AS NEEDED FOR SLEEP   No facility-administered encounter medications on file as of 08/21/2021.    Allergies (verified) Codeine and Omeprazole   History: Past Medical History:  Diagnosis Date   Cancer ([place]) 2001   breast    Cataracts, bilateral    GERD (gastroesophageal reflux disease)    Hyperlipidemia    Past Surgical History:  Procedure Laterality Date   BREAST SURGERY     RADIAL KERATOTOMY     Family History  Problem Relation Age of Onset   Hyperlipidemia Mother    Alzheimer's disease Sister    Esophageal cancer Cousin 82   Colon cancer Neg Hx    Stomach cancer Neg Hx    Social History   Socioeconomic History   Marital status: Divorced    Spouse name: Not on file   Number of children: Not on file   Years of education: Not on file   Highest education level: Not on file  Occupational History   Not on file  Tobacco Use   Smoking status: Former    Types: Cigarettes    Quit date: 10/04/1990    Years  since quitting: 30.9   Smokeless tobacco: Never  Vaping Use   Vaping Use: Never used  Substance and Sexual Activity   Alcohol use: Yes    Alcohol/week: 13.0 - 15.0 standard drinks    Types: 8 Glasses of wine, 5 - 7 Standard drinks or equivalent per week   Drug use: No   Sexual activity: Not on file  Other Topics Concern   Not on file  Social History Narrative   Diet:      Do you drink/ eat things with caffeine? Coffee 2-3 cups /day      Marital status:  Divorced                             What year were you married ? 2004      Do you live in a house, apartment,assistred living, condo, trailer, etc.)? House      Is  it one or more stories? 1      How many persons live in your home ? 1      Do you have any pets in your home ?(please list)  1 dog      Current or past profession: Residential cleaning      Do you exercise?                              Type & how often: Rarely      Do you have a living will? Yes      Do you have a DNR form?   No                    If not, do you want to discuss one? Yes      Do you have signed POA?HPOA forms?   Yes              If so, please bring to your        appointment      Social Determinants of Health   Financial Resource Strain: Not on file  Food Insecurity: Not on file  Transportation Needs: Not on file  Physical Activity: Not on file  Stress: Not on file  Social Connections: Not on file    Tobacco Counseling Counseling given: Not Answered   Clinical Intake:  Pre-visit preparation completed: Yes  Pain : No/denies pain     BMI - recorded: 27 Nutritional Status: BMI 25 -29 Overweight Nutritional Risks: None Diabetes: No  How often do you need to have someone help you when you read instructions, pamphlets, or other written materials from your doctor or pharmacy?: 1 - Never  Diabetic?no         Activities of Daily Living In your present state of health, do you have any difficulty performing the following activities: 08/21/2021  Hearing? N  Vision? N  Difficulty concentrating or making decisions? N  Walking or climbing stairs? N  Dressing or bathing? N  Doing errands, shopping? N  Preparing Food and eating ? N  Using the Toilet? N  In the past six months, have you accidently leaked urine? N  Do you have problems with loss of bowel control? N  Managing your Medications? N  Managing your Finances? N  Housekeeping or managing your Housekeeping? N  Some recent data might be hidden    Patient Care Team: Lauree Chandler, NP as PCP - General (Geriatric  Medicine) Webb Laws, [place] as Referring Physician  (Optometry) Druscilla Brownie, MD as Consulting Physician (Dermatology)  Indicate any recent Medical Services you may have received from other than [facility] providers in the past year (date may be approximate).     Assessment:   This is a routine wellness examination for [place].  Hearing/Vision screen Hearing Screening - Comments:: Patient has no hearing problems Vision Screening - Comments:: Patient wears glasses. Last eye exam was in the spring. Patient sees Dr. Edwyna Shell  Dietary issues and exercise activities discussed: Current Exercise Habits: Home exercise routine, Type of exercise: walking, Time (Minutes): 60, Frequency (Times/Week): 2, Weekly Exercise (Minutes/Week): 120   Goals Addressed   None   Depression Screen PHQ 2/9 Scores 08/21/2021 05/22/2021 05/16/2020 10/19/2019 09/29/2018 09/23/2017 09/23/2017  PHQ - 2 Score 0 0 0 0 0 1 1    Fall Risk Fall Risk  08/21/2021 05/22/2021 05/16/2020 04/04/2020 11/09/2019  Falls in the past year? 0 1 0 0 0  Number falls in past yr: 0 0 0 0 0  Injury with Fall? 0 0 0 0 0  Risk for fall due to : No Fall Risks No Fall Risks - - -  Follow up Falls evaluation completed Falls evaluation completed - - -    FALL RISK PREVENTION PERTAINING TO THE HOME:  Any stairs in or around the home? No  If so, are there any without handrails? No  Home free of loose throw rugs in walkways, pet beds, electrical cords, etc? Yes  Adequate lighting in your home to reduce risk of falls? Yes   ASSISTIVE DEVICES UTILIZED TO PREVENT FALLS:  Life alert? No  Use of a cane, walker or w/c? No  Grab bars in the bathroom? Yes  Shower chair or bench in shower? Yes  Elevated toilet seat or a handicapped toilet? Yes   TIMED UP AND GO:  Was the test performed? No .    Cognitive Function: MMSE - Mini Mental State Exam 09/29/2018 09/23/2017 06/18/2016  Orientation to time 5 5 5   Orientation to Place 5 5 5   Registration 3 3 3   Attention/ Calculation 5 5 5   Recall 3 2 2    Language- name 2 objects 2 2 2   Language- repeat 1 1 1   Language- follow 3 step command 3 3 3   Language- read & follow direction 1 1 1   Write a sentence 1 1 1   Copy design 1 1 1   Total score 30 29 29      6CIT Screen 08/21/2021 10/19/2019  What Year? 0 points 0 points  What month? 0 points 0 points  What time? 0 points 0 points  Count back from 20 0 points 0 points  Months in reverse 0 points 0 points  Repeat phrase 0 points 0 points  Total Score 0 0    Immunizations Immunization History  Administered Date(s) Administered   Influenza, High Dose Seasonal PF 08/18/2018   Influenza, Quadrivalent, Recombinant, Inj, Pf 08/13/2021   Influenza,inj,Quad PF,6+ Mos 06/18/2016, 09/23/2017   Influenza-Unspecified 09/01/2018, 08/10/2021   Moderna SARS-COV2 Booster Vaccination 08/01/2020   Moderna Sars-Covid-2 Vaccination 11/09/2019, 12/12/2019   Pneumococcal Conjugate-13 06/18/2016   Pneumococcal Polysaccharide-23 09/23/2017   Unspecified SARS-COV-2 Vaccination 11/09/2019, 12/12/2019, 08/01/2020   Zoster Recombinat (Shingrix) 08/13/2021   Zoster, Live 06/05/2015    TDAP status: Due, Education has been provided regarding the importance of this vaccine. Advised may receive this vaccine at local pharmacy or Health Dept. Aware to provide a copy of the vaccination record  if obtained from local pharmacy or Health Dept. Verbalized acceptance and understanding.  Flu Vaccine status: Up to date  Pneumococcal vaccine status: Up to date  Covid-19 vaccine status: Information provided on how to obtain vaccines.   Qualifies for Shingles Vaccine? Yes   Zostavax completed No   Shingrix Completed?: Yes  Screening Tests Health Maintenance  Topic Date Due   TETANUS/TDAP  Never done   COVID-19 Vaccine (3 - Moderna risk series) 08/29/2020   Zoster Vaccines- Shingrix (2 of 2) 10/08/2021   COLONOSCOPY (Pts 45-77yrs Insurance coverage will need to be confirmed)  01/28/2022   MAMMOGRAM  02/13/2022    Pneumonia Vaccine 103+ Years old  Completed   INFLUENZA VACCINE  Completed   DEXA SCAN  Completed   Hepatitis C Screening  Completed   HPV VACCINES  Aged Out    Health Maintenance  Health Maintenance Due  Topic Date Due   TETANUS/TDAP  Never done   COVID-19 Vaccine (3 - Moderna risk series) 08/29/2020    Colorectal cancer screening: Type of screening: Colonoscopy. Completed 2018. Repeat every 5 years  Mammogram status: Completed 02/14/20. Repeat every year  Bone Density status: Completed 02/26/21. Results reflect: Bone density results: OSTEOPENIA. Repeat every 2 years.  Lung Cancer Screening: (Low Dose CT Chest recommended if Age 21-80 years, 30 pack-year currently smoking OR have quit w/in 15years.) does not qualify.   Lung Cancer Screening Referral: na  Additional Screening:  Hepatitis C Screening: does qualify; Completed   Vision Screening: Recommended annual ophthalmology exams for early detection of glaucoma and other disorders of the eye. Is the patient up to date with their annual eye exam?  Yes  Who is the provider or what is the name of the office in which the patient attends annual eye exams? [facility] If pt is not established with a provider, would they like to be referred to a provider to establish care? No .   Dental Screening: Recommended annual dental exams for proper oral hygiene  Community Resource Referral / Chronic Care Management: CRR required this visit?  No   CCM required this visit?  No      Plan:     I have personally reviewed and noted the following in the patient's chart:   Medical and social history Use of alcohol, tobacco or illicit drugs  Current medications and supplements including opioid prescriptions.  Functional ability and status Nutritional status Physical activity Advanced directives List of other physicians Hospitalizations, surgeries, and ER visits in previous 12 months Vitals Screenings to include cognitive, depression, and  falls Referrals and appointments  In addition, I have reviewed and discussed with patient certain preventive protocols, quality metrics, and best practice recommendations. A written personalized care plan for preventive services as well as general preventive health recommendations were provided to patient.     Lauree Chandler, NP   08/21/2021    Virtual Visit via Telephone Note  I connected withNAME@ on 08/21/21 at  4:15 PM EST by telephone and verified that I am speaking with the correct person using two identifiers.  Location: Patient: home Provider: [place]   I discussed the limitations, risks, security and privacy concerns of performing an evaluation and management service by telephone and the availability of in person appointments. I also discussed with the patient that there may be a patient responsible charge related to this service. The patient expressed understanding and agreed to proceed.   I discussed the assessment and treatment plan with the patient. The patient was  provided an opportunity to ask questions and all were answered. The patient agreed with the plan and demonstrated an understanding of the instructions.   The patient was advised to call back or seek an in-person evaluation if the symptoms worsen or if the condition fails to improve as anticipated.  I provided 15 minutes of non-face-to-face time during this encounter.  Carlos American. Harle Battiest [employer] printed and mailed

## 2021-09-10 ENCOUNTER — Ambulatory Visit: Payer: Medicare Other | Admitting: Surgical

## 2021-09-10 ENCOUNTER — Encounter: Payer: Self-pay | Admitting: Orthopedic Surgery

## 2021-09-10 ENCOUNTER — Other Ambulatory Visit: Payer: Self-pay

## 2021-09-10 DIAGNOSIS — M19012 Primary osteoarthritis, left shoulder: Secondary | ICD-10-CM

## 2021-09-10 MED ORDER — METHYLPREDNISOLONE ACETATE 40 MG/ML IJ SUSP
40.0000 mg | INTRAMUSCULAR | Status: AC | PRN
  Administered 2021-09-10: 40 mg via INTRA_ARTICULAR

## 2021-09-10 MED ORDER — BUPIVACAINE HCL 0.5 % IJ SOLN
9.0000 mL | INTRAMUSCULAR | Status: AC | PRN
  Administered 2021-09-10: 9 mL via INTRA_ARTICULAR

## 2021-09-10 MED ORDER — LIDOCAINE HCL 1 % IJ SOLN
5.0000 mL | INTRAMUSCULAR | Status: AC | PRN
  Administered 2021-09-10: 5 mL

## 2021-09-10 NOTE — Progress Notes (Signed)
Office Visit Note   Patient: Theresa Pena           Date of Birth: Mar 03, 1951           MRN: [id number] Visit Date: 09/10/2021 Requested by: Lauree Chandler, NP [place],  [place] [id number] PCP: Lauree Chandler, NP  Subjective: Chief Complaint  Patient presents with   Left Shoulder - Pain    HPI: Theresa Pena is a 70 y.o. female who presents to the office complaining of left shoulder pain.  Patient has history of left shoulder osteoarthritis.  She would like to try another injection.  Last injection which was about 4 to 5 months ago provided about 4 months of relief.  Most of her pain is with active forward flexion.  No new injury.  Denies any neck pain or radicular pain..                ROS: All systems reviewed are negative as they relate to the chief complaint within the history of present illness.  Patient denies fevers or chills.  Assessment & Plan: Visit Diagnoses:  1. Primary osteoarthritis, left shoulder     Plan: Patient is a 70 year old female who presents for repeat evaluation of left shoulder pain.  She has history of left shoulder glenohumeral osteoarthritis.  Doing well with serial injections.  She would like to try another injection today.  Injection was administered and she tolerated the procedure well.  Plan to follow-up in 3 to 4 months for repeat injection as needed.  Follow-Up Instructions: No follow-ups on file.   Orders:  No orders of the defined types were placed in this encounter.  No orders of the defined types were placed in this encounter.     Procedures: Large Joint Inj: L glenohumeral on 09/10/2021 11:18 AM Indications: diagnostic evaluation and pain Details: 18 G 1.5 in needle, posterior approach  Arthrogram: No  Medications: 9 mL bupivacaine 0.5 %; 40 mg methylPREDNISolone acetate 40 MG/ML; 5 mL lidocaine 1 % Outcome: tolerated well, no immediate complications Procedure, treatment alternatives, risks and benefits  explained, specific risks discussed. Consent was given by the patient. Immediately prior to procedure a time out was called to verify the correct patient, procedure, equipment, support staff and site/side marked as required. Patient was prepped and draped in the usual sterile fashion.      Clinical Data: No additional findings.  Objective: Vital Signs: There were no vitals taken for this visit.  Physical Exam:  Constitutional: Patient appears well-developed HEENT:  Head: Normocephalic Eyes:EOM are normal Neck: Normal range of motion Cardiovascular: Normal rate Pulmonary/chest: Effort normal Neurologic: Patient is alert Skin: Skin is warm Psychiatric: Patient has normal mood and affect  Ortho Exam: Ortho exam demonstrates left shoulder with 20 degrees external rotation, 70 degrees abduction, 95 degrees forward flexion.  Coarseness and grinding consistent with osteoarthritis noted.  Weakness with external rotation actively.  5/5 motor strength of supraspinatus and subscapularis.  Specialty Comments:  No specialty comments available.  Imaging: No results found.   PMFS History: Patient Active Problem List   Diagnosis Date Noted   Mixed hyperlipidemia 09/23/2017   High risk medication use 09/23/2017   Insomnia 09/23/2017   History of breast cancer in female 03/14/2015   Mood disorder ([place]) 02/24/2012   Past Medical History:  Diagnosis Date   Cancer ([place]) 2001   breast    Cataracts, bilateral    GERD (gastroesophageal reflux disease)    Hyperlipidemia  Family History  Problem Relation Age of Onset   Hyperlipidemia Mother    Alzheimer's disease Sister    Esophageal cancer Cousin 57   Colon cancer Neg Hx    Stomach cancer Neg Hx     Past Surgical History:  Procedure Laterality Date   BREAST SURGERY     RADIAL KERATOTOMY     Social History   Occupational History   Not on file  Tobacco Use   Smoking status: Former    Types: Cigarettes    Quit date:  10/04/1990    Years since quitting: 30.9   Smokeless tobacco: Never  Vaping Use   Vaping Use: Never used  Substance and Sexual Activity   Alcohol use: Yes    Alcohol/week: 13.0 - 15.0 standard drinks    Types: 8 Glasses of wine, 5 - 7 Standard drinks or equivalent per week   Drug use: No   Sexual activity: Not on file

## 2021-09-28 ENCOUNTER — Other Ambulatory Visit: Payer: Self-pay | Admitting: Adult Health

## 2021-09-28 MED ORDER — MOLNUPIRAVIR EUA 200MG CAPSULE: 4.0000 | ORAL_CAPSULE | Freq: Two times a day (BID) | ORAL | 0 refills | Status: DC

## 2021-09-28 NOTE — Progress Notes (Signed)
Called on call and stated that she tested + for COVID-19 using home test kit. Prescribed Mulnopiravir 800 mg Q 12 hours X 5 days and instructed to call office to follow up.

## 2021-09-30 ENCOUNTER — Other Ambulatory Visit: Payer: Self-pay

## 2021-09-30 ENCOUNTER — Telehealth (INDEPENDENT_AMBULATORY_CARE_PROVIDER_SITE_OTHER): Payer: Medicare Other | Admitting: Family

## 2021-09-30 ENCOUNTER — Encounter: Payer: Self-pay | Admitting: Family

## 2021-09-30 DIAGNOSIS — U071 COVID-19: Secondary | ICD-10-CM

## 2021-09-30 DIAGNOSIS — J069 Acute upper respiratory infection, unspecified: Secondary | ICD-10-CM | POA: Diagnosis not present

## 2021-09-30 MED ORDER — VITAMIN D3 50 MCG (2000 UT) PO CAPS: 2000.0000 [IU] | ORAL_CAPSULE | Freq: Every day | ORAL | 0 refills | Status: AC

## 2021-09-30 MED ORDER — ASPIRIN 81 MG PO TBEC: 81.0000 mg | DELAYED_RELEASE_TABLET | Freq: Every day | ORAL | 0 refills | Status: AC

## 2021-09-30 MED ORDER — ASCORBIC ACID 500 MG PO TABS: 250.0000 mg | ORAL_TABLET | Freq: Every day | ORAL | 0 refills | Status: AC

## 2021-09-30 MED ORDER — LORATADINE 10 MG PO TABS: 10.0000 mg | ORAL_TABLET | Freq: Every day | ORAL | 0 refills | Status: DC

## 2021-09-30 MED ORDER — ZINC GLUCONATE 50 MG PO TABS: 50.0000 mg | ORAL_TABLET | Freq: Every day | ORAL | 0 refills | Status: AC

## 2021-09-30 NOTE — Progress Notes (Signed)
This service is provided via telemedicine  No vital signs collected/recorded due to the encounter was a telemedicine visit.   Location of patient (ex: home, work):  Home  Patient consents to a telephone visit:  Yes  Location of the provider (ex: office, home):  [employer].  Name of any referring provider:  Lauree Chandler, NP   Names of all persons participating in the telemedicine service and their role in the encounter:  Patient, Theresa Pena, Theresa Pena, Theresa Pena, Theresa Silversmith, NP.    Time spent on call: 8 minutes spent on the phone with Medical Assistant.      Provider: Alyvia Derk FNP-C  Lauree Chandler, NP  Patient Care Team: Lauree Chandler, NP as PCP - General (Geriatric Medicine) Theresa Laws, [place] as Referring Physician (Optometry) Druscilla Brownie, MD as Consulting Physician (Dermatology)  Extended Emergency Contact Information Primary Emergency Contact: Theresa Pena Address: [place], [place] [address] Johnnette Litter of [place] Phone: [phone] Relation: Sister  Code Status:  Full Code  Goals of care: Advanced Directive information Advanced Directives 09/30/2021  Does Patient Have a Medical Advance Directive? Yes  Type of Advance Directive Living will  Does patient want to make changes to medical advance directive? No - Patient declined  Copy of [place] in Chart? -  Would patient like information on creating a medical advance directive? -     Chief Complaint  Patient presents with   Acute Visit    Patient complains of testing Positive for Covid-19. Patient symptoms are headache.    HPI:  Pt is a 70 y.o. female seen today for an acute visit for evaluation of COVID-19 positive.Had a cold on Christmas eve.Temp 98.0 cough has gotten better.symptoms were worst over christmas but now has improved.Still has nasal congestion,runny nose.Has had no fever,chills or cough. Does not recall  exposure to person sick with COVID-19. She denies any She denies any fever,chills,cough,fatigue,body aches,chest tightness,chest pain,palpitation or shortness of breath.     Past Medical History:  Diagnosis Date   Cancer ([place]) 2001   breast    Cataracts, bilateral    GERD (gastroesophageal reflux disease)    Hyperlipidemia    Past Surgical History:  Procedure Laterality Date   BREAST SURGERY     RADIAL KERATOTOMY      Allergies  Allergen Reactions   Codeine Nausea Only    Also has dizziness   Omeprazole Other (See Comments)    GI upset     Outpatient Encounter Medications as of 09/30/2021  Medication Sig   Cholecalciferol (VITAMIN D3 PO) Take 500 Units by mouth daily.   Cyanocobalamin (VITAMIN B 12 PO) Take 1 tablet by mouth.   famotidine (PEPCID) 20 MG tablet TAKE 1/2 TABLET BY MOUTH EVERY MORNING AND 1 TABLET EVERY EVENING   fish oil-omega-3 fatty acids 1000 MG capsule Take 1 g by mouth daily.    Misc Natural Products (GLUCOSAMINE CHONDROITIN ADV PO) Take 1 tablet by mouth 2 (two) times daily.   Multiple Vitamin (MULTIVITAMIN) tablet Take 1 tablet by mouth daily.   rosuvastatin (CRESTOR) 5 MG tablet Take 1 tablet (5 mg total) by mouth daily.   UNABLE TO FIND Med Name: Bone Up Calcium  supplement 333.3 mg , two- three times daily   venlafaxine (EFFEXOR) 75 MG tablet Take 1 tablet (75 mg total) by mouth 2 (two) times daily.   zolpidem (AMBIEN) 5 MG tablet TAKE 1/2  TABLET(2.5 MG) BY MOUTH AT BEDTIME AS NEEDED FOR SLEEP   [DISCONTINUED] molnupiravir EUA (LAGEVRIO) 200 mg CAPS capsule Take 4 capsules (800 mg total) by mouth 2 (two) times daily for 5 days.   No facility-administered encounter medications on file as of 09/30/2021.    Review of Systems  Constitutional:  Negative for appetite change, chills, fatigue, fever and unexpected weight change.  HENT:  Positive for rhinorrhea. Negative for congestion, ear discharge, ear pain, postnasal drip, sinus pressure, sinus pain,  sneezing, sore throat and tinnitus.   Eyes:  Negative for pain, discharge, redness, itching and visual disturbance.  Respiratory:  Negative for cough, chest tightness, shortness of breath and wheezing.   Cardiovascular:  Negative for chest pain, palpitations and leg swelling.  Gastrointestinal:  Negative for abdominal distention, abdominal pain, constipation, diarrhea, nausea and vomiting.  Musculoskeletal:  Negative for arthralgias, back pain, myalgias, neck pain and neck stiffness.  Skin:  Negative for color change, pallor and rash.  Neurological:  Positive for headaches. Negative for dizziness, speech difficulty, weakness, light-headedness and numbness.   Immunization History  Administered Date(s) Administered   Influenza, High Dose Seasonal PF 08/18/2018   Influenza, Quadrivalent, Recombinant, Inj, Pf 08/13/2021   Influenza,inj,Quad PF,6+ Mos 06/18/2016, 09/23/2017   Influenza-Unspecified 09/01/2018, 08/10/2021   Moderna SARS-COV2 Booster Vaccination 08/01/2020   Moderna Sars-Covid-2 Vaccination 11/09/2019, 12/12/2019   Pneumococcal Conjugate-13 06/18/2016   Pneumococcal Polysaccharide-23 09/23/2017   Unspecified SARS-COV-2 Vaccination 11/09/2019, 12/12/2019, 08/01/2020   Zoster Recombinat (Shingrix) 08/13/2021   Zoster, Live 06/05/2015   Pertinent  Health Maintenance Due  Topic Date Due   COLONOSCOPY (Pts 45-79yrs Insurance coverage will need to be confirmed)  01/28/2022   MAMMOGRAM  02/13/2022   INFLUENZA VACCINE  Completed   DEXA SCAN  Completed   Fall Risk 04/04/2020 05/16/2020 05/22/2021 08/21/2021 09/30/2021  Falls in the past year? 0 0 1 0 0  Was there an injury with Fall? 0 0 0 0 0  Fall Risk Category Calculator 0 0 1 0 0  Fall Risk Category Low Low Low Low Low  Patient Fall Risk Level Low fall risk Low fall risk Low fall risk Low fall risk Low fall risk  Patient at Risk for Falls Due to - - No Fall Risks No Fall Risks No Fall Risks  Fall risk Follow up - - Falls  evaluation completed Falls evaluation completed Falls evaluation completed   Functional Status Survey:    There were no vitals filed for this visit. There is no height or weight on file to calculate BMI. Physical Exam Unable to complete on telephone visit   Labs reviewed: Recent Labs    11/18/20 0813 05/18/21 0816  NA 139 138  K 5.0 4.6  CL 100 103  CO2 29 25  GLUCOSE 82 88  BUN 19 19  CREATININE 0.81 0.73  CALCIUM 9.8 9.1   Recent Labs    11/18/20 0813 05/18/21 0816  AST 14 16  ALT 15 15  BILITOT 0.4 0.3  PROT 6.8 6.7   Recent Labs    11/18/20 0813 05/18/21 0816  WBC 9.2 7.5  NEUTROABS 4,913 4,748  HGB 13.8 13.6  HCT 40.4 41.7  MCV 93.5 96.1  PLT 265 253   Lab Results  Component Value Date   TSH 2.46 09/29/2018   No results found for: HGBA1C Lab Results  Component Value Date   CHOL 216 (H) 05/18/2021   HDL 73 05/18/2021   LDLCALC 119 (H) 05/18/2021   TRIG 127 05/18/2021  CHOLHDL 3.0 05/18/2021    Significant Diagnostic Results in last 30 days:  No results found.  Assessment/Plan 1. COVID-19 virus RNA test result positive at limit of detection No fever today  Symptoms started on 09/26/2021  Declined antiviral medication.symptoms have improved.  - supportive care with vitamins ,zinc as below.  - zinc gluconate 50 MG tablet; Take 1 tablet (50 mg total) by mouth daily for 14 days.  Dispense: 14 tablet; Refill: 0 - vitamin C (VITAMIN C) 500 MG tablet; Take 0.5 tablets (250 mg total) by mouth daily for 14 days.  Dispense: 14 tablet; Refill: 0 - loratadine (CLARITIN) 10 MG tablet; Take 1 tablet (10 mg total) by mouth daily for 14 days.  Dispense: 14 tablet; Refill: 0 - Cholecalciferol (VITAMIN D3) 50 MCG (2000 UT) capsule; Take 1 capsule (2,000 Units total) by mouth daily for 14 days.  Dispense: 14 capsule; Refill: 0  2. Upper respiratory tract infection due to COVID-19 virus Symptoms have improved just has runny nose and nasal congestion. -  supportive care with supplement and hydration as below.  - zinc gluconate 50 MG tablet; Take 1 tablet (50 mg total) by mouth daily for 14 days.  Dispense: 14 tablet; Refill: 0 - vitamin C (VITAMIN C) 500 MG tablet; Take 0.5 tablets (250 mg total) by mouth daily for 14 days.  Dispense: 14 tablet; Refill: 0 - loratadine (CLARITIN) 10 MG tablet; Take 1 tablet (10 mg total) by mouth daily for 14 days.  Dispense: 14 tablet; Refill: 0 - Cholecalciferol (VITAMIN D3) 50 MCG (2000 UT) capsule; Take 1 capsule (2,000 Units total) by mouth daily for 14 days.  Dispense: 14 capsule; Refill:  - Tylenol as needed for fever or body aches - over the counter Mucinex as needed for cough  - advised to increase  fruits intake in diet - increase water intake/soup /tea  - Notify provider or go to ED if you develop any chest tightness,chest pain or shortness of breath   Family/ staff Communication: Reviewed plan of care with patient verbalized understanding  Labs/tests ordered: None   Next Appointment: As needed if symptoms worsen or fail to improve   I connected with  Theresa Pena on 09/30/21 by Telephone enabled telemedicine application and verified that I am speaking with the correct person using two identifiers.   I discussed the limitations of evaluation and management by telemedicine. The patient expressed understanding and agreed to proceed.  Spent 12 minutes of non-face to face with patient  >50% time spent counseling; reviewing medical record; tests; labs; and developing future plan of care.  Sandrea Hughs, NP

## 2021-09-30 NOTE — Patient Instructions (Signed)
-   Take Zinc 50 mg tablet one by mouth daily for 14 days  - Take Vitamin C 500 mg tablet one by mouth twice daily x 14 days  - Increase  vitamin D supplement from 500 units daily to 2000 units daily x 14 days  - Loratadine ( Claritin ) 10 mg tablet one by mouth daily x 14 days for runny nose  - Tylenol as needed for fever or body aches  - over the counter Mucinex as needed for cough  - increase your fruits intake in your diet  - increase your water intake/soup Ozzie Hoyle  - Notify provider or go to ED if you develop any chest tightness,chest pain or shortness of breath

## 2021-11-17 ENCOUNTER — Other Ambulatory Visit: Payer: Self-pay | Admitting: Nurse Practitioner

## 2021-11-17 DIAGNOSIS — F39 Unspecified mood [affective] disorder: Secondary | ICD-10-CM

## 2021-11-25 ENCOUNTER — Other Ambulatory Visit: Payer: Self-pay

## 2021-11-25 ENCOUNTER — Other Ambulatory Visit: Payer: Medicare Other

## 2021-11-25 DIAGNOSIS — M19012 Primary osteoarthritis, left shoulder: Secondary | ICD-10-CM

## 2021-11-25 DIAGNOSIS — M8589 Other specified disorders of bone density and structure, multiple sites: Secondary | ICD-10-CM | POA: Diagnosis not present

## 2021-11-25 DIAGNOSIS — E782 Mixed hyperlipidemia: Secondary | ICD-10-CM

## 2021-11-25 LAB — CBC WITH DIFFERENTIAL/PLATELET
Absolute Monocytes: 446 cells/uL (ref 200–950)
Basophils Absolute: 28 cells/uL (ref 0–200)
Basophils Relative: 0.6 %
Eosinophils Absolute: 129 cells/uL (ref 15–500)
Eosinophils Relative: 2.8 %
HCT: 37.9 % (ref 35.0–45.0)
Hemoglobin: 12.7 g/dL (ref 11.7–15.5)
Lymphs Abs: 1826 cells/uL (ref 850–3900)
MCH: 31.3 pg (ref 27.0–33.0)
MCHC: 33.5 g/dL (ref 32.0–36.0)
MCV: 93.3 fL (ref 80.0–100.0)
MPV: 12 fL (ref 7.5–12.5)
Monocytes Relative: 9.7 %
Neutro Abs: 2171 cells/uL (ref 1500–7800)
Neutrophils Relative %: 47.2 %
Platelets: 231 10*3/uL (ref 140–400)
RBC: 4.06 10*6/uL (ref 3.80–5.10)
RDW: 12.8 % (ref 11.0–15.0)
Total Lymphocyte: 39.7 %
WBC: 4.6 10*3/uL (ref 3.8–10.8)

## 2021-11-25 LAB — COMPLETE METABOLIC PANEL WITH GFR
AG Ratio: 1.8 (calc) (ref 1.0–2.5)
ALT: 14 U/L (ref 6–29)
AST: 17 U/L (ref 10–35)
Albumin: 4.3 g/dL (ref 3.6–5.1)
Alkaline phosphatase (APISO): 47 U/L (ref 37–153)
BUN: 12 mg/dL (ref 7–25)
CO2: 28 mmol/L (ref 20–32)
Calcium: 9.3 mg/dL (ref 8.6–10.4)
Chloride: 102 mmol/L (ref 98–110)
Creat: 0.65 mg/dL (ref 0.60–1.00)
Globulin: 2.4 g/dL (calc) (ref 1.9–3.7)
Glucose, Bld: 94 mg/dL (ref 65–99)
Potassium: 4 mmol/L (ref 3.5–5.3)
Sodium: 138 mmol/L (ref 135–146)
Total Bilirubin: 0.7 mg/dL (ref 0.2–1.2)
Total Protein: 6.7 g/dL (ref 6.1–8.1)
eGFR: 94 mL/min/{1.73_m2} (ref >=60)

## 2021-11-25 LAB — LIPID PANEL
Cholesterol: 206 mg/dL — ABNORMAL HIGH (ref <200)
HDL: 71 mg/dL (ref >=50)
LDL Cholesterol (Calc): 116 mg/dL (calc) — ABNORMAL HIGH
Non-HDL Cholesterol (Calc): 135 mg/dL (calc) — ABNORMAL HIGH (ref <130)
Total CHOL/HDL Ratio: 2.9 (calc) (ref <5.0)
Triglycerides: 93 mg/dL (ref <150)

## 2021-11-27 ENCOUNTER — Ambulatory Visit (INDEPENDENT_AMBULATORY_CARE_PROVIDER_SITE_OTHER): Payer: Medicare Other | Admitting: Nurse Practitioner

## 2021-11-27 ENCOUNTER — Encounter: Payer: Self-pay | Admitting: Nurse Practitioner

## 2021-11-27 ENCOUNTER — Other Ambulatory Visit: Payer: Self-pay

## 2021-11-27 VITALS — BP 118/80 | HR 69 | Temp 97.5°F | Ht 68.0 in | Wt 182.6 lb

## 2021-11-27 DIAGNOSIS — G4700 Insomnia, unspecified: Secondary | ICD-10-CM | POA: Diagnosis not present

## 2021-11-27 DIAGNOSIS — F39 Unspecified mood [affective] disorder: Secondary | ICD-10-CM

## 2021-11-27 DIAGNOSIS — E782 Mixed hyperlipidemia: Secondary | ICD-10-CM | POA: Diagnosis not present

## 2021-11-27 DIAGNOSIS — M19012 Primary osteoarthritis, left shoulder: Secondary | ICD-10-CM | POA: Diagnosis not present

## 2021-11-27 DIAGNOSIS — K219 Gastro-esophageal reflux disease without esophagitis: Secondary | ICD-10-CM | POA: Diagnosis not present

## 2021-11-27 DIAGNOSIS — M8589 Other specified disorders of bone density and structure, multiple sites: Secondary | ICD-10-CM

## 2021-11-27 NOTE — Patient Instructions (Addendum)
To get second shingles vaccine at pharmacy  To get COVID vaccine (wait ~3 months after illness)   TDap due- to get at pharmacy

## 2021-11-27 NOTE — Progress Notes (Signed)
Careteam: Patient Care Team: Lauree Chandler, NP as PCP - General (Geriatric Medicine) Webb Laws, [place] as Referring Physician (Optometry) Druscilla Brownie, MD as Consulting Physician (Dermatology)  PLACE OF SERVICE:  [place] Directive information Does Patient Have a Medical Advance Directive?: Yes, Type of Advance Directive: [place], Does patient want to make changes to medical advance directive?: No - Patient declined  Allergies  Allergen Reactions   Codeine Nausea Only    Also has dizziness   Omeprazole Other (See Comments)    GI upset     Chief Complaint  Patient presents with   Medical Management of Chronic Issues    6 month follow-up, discuss labs (copy printed). Discuss need for td/tdap, additional covid boosters and shingrix or post pone if patient reuses.      HPI: Patient is a 71 y.o. female for routine follow up.  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 Reports her sister is aware of the memory loss and is stressed/scared because both parents had it.   Insomnia- stable on ambien  Mood is up and down, continues on effexor which has been effective. No increase in anxiety or depression.   GERD- only taking pepcid qhs, doing well, no increase in GERD  Continues to walk. Dog sits and walks with the dogs- walking 1-3 dogs.    Review of Systems:  Review of Systems  Constitutional:  Negative for chills, fever and weight loss.  HENT:  Negative for tinnitus.   Respiratory:  Negative for cough, sputum production and shortness of breath.   Cardiovascular:  Negative for chest pain, palpitations and leg swelling.  Gastrointestinal:  Negative for abdominal pain, constipation, diarrhea and heartburn.  Genitourinary:  Negative for dysuria, frequency and urgency.  Musculoskeletal:  Negative for back pain, falls, joint pain and myalgias.  Skin: Negative.   Neurological:  Negative for dizziness and headaches.   Psychiatric/Behavioral:  Negative for depression and memory loss. The patient does not have insomnia.    Past Medical History:  Diagnosis Date   Cancer ([place]) 2001   breast    Cataracts, bilateral    GERD (gastroesophageal reflux disease)    Hyperlipidemia    Past Surgical History:  Procedure Laterality Date   BREAST SURGERY     RADIAL KERATOTOMY     Social History:   reports that she quit smoking about 31 years ago. Her smoking use included cigarettes. She has never used smokeless tobacco. She reports current alcohol use of about 13.0 - 15.0 standard drinks per week. She reports that she does not use drugs.  Family History  Problem Relation Age of Onset   Hyperlipidemia Mother    Alzheimer's disease Sister    Esophageal cancer Cousin 75   Colon cancer Neg Hx    Stomach cancer Neg Hx     Medications: Patient's Medications  New Prescriptions   No medications on file  Previous Medications   CHOLECALCIFEROL (VITAMIN D3 PO)    Take 500 Units by mouth daily.   CYANOCOBALAMIN (VITAMIN B 12 PO)    Take 1 tablet by mouth.   FAMOTIDINE (PEPCID) 20 MG TABLET    Take 20 mg by mouth daily.   FISH OIL-OMEGA-3 FATTY ACIDS 1000 MG CAPSULE    Take 1 g by mouth daily.    MISC NATURAL PRODUCTS (GLUCOSAMINE CHONDROITIN ADV PO)    Take 1 tablet by mouth 2 (two) times daily.   MULTIPLE VITAMIN (MULTIVITAMIN) TABLET  Take 1 tablet by mouth daily.   ROSUVASTATIN (CRESTOR) 5 MG TABLET    Take 1 tablet (5 mg total) by mouth daily.   UNABLE TO FIND    Med Name: Bone Up Calcium  supplement 333.3 mg , two- three times daily   VENLAFAXINE (EFFEXOR) 75 MG TABLET    TAKE 1 TABLET(75 MG) BY MOUTH TWICE DAILY   ZOLPIDEM (AMBIEN) 5 MG TABLET    TAKE 1/2 TABLET(2.5 MG) BY MOUTH AT BEDTIME AS NEEDED FOR SLEEP  Modified Medications   No medications on file  Discontinued Medications   FAMOTIDINE (PEPCID) 20 MG TABLET    TAKE 1/2 TABLET BY MOUTH EVERY MORNING AND 1 TABLET EVERY EVENING   LORATADINE  (CLARITIN) 10 MG TABLET    Take 1 tablet (10 mg total) by mouth daily for 14 days.    Physical Exam:  Vitals:   11/27/21 1025  BP: 118/80  Pulse: 69  Temp: (!) 97.5 F (36.4 C)  TempSrc: Temporal  SpO2: 96%  Weight: 182 lb 9.6 oz (82.8 kg)  Height: 5\' 8"  (1.727 m)   Body mass index is 27.76 kg/m. Wt Readings from Last 3 Encounters:  11/27/21 182 lb 9.6 oz (82.8 kg)  05/22/21 180 lb (81.6 kg)  11/21/20 182 lb 12.8 oz (82.9 kg)    Physical Exam Constitutional:      General: She is not in acute distress.    Appearance: She is well-developed. She is not diaphoretic.  HENT:     Head: Normocephalic and atraumatic.     Mouth/Throat:     Pharynx: No oropharyngeal exudate.  Eyes:     Conjunctiva/sclera: Conjunctivae normal.     Pupils: Pupils are equal, round, and reactive to light.  Cardiovascular:     Rate and Rhythm: Normal rate and regular rhythm.     Heart sounds: Normal heart sounds.  Pulmonary:     Effort: Pulmonary effort is normal.     Breath sounds: Normal breath sounds.  Abdominal:     General: Bowel sounds are normal.     Palpations: Abdomen is soft.  Musculoskeletal:     Cervical back: Normal range of motion and neck supple.     Right lower leg: No edema.     Left lower leg: No edema.  Skin:    General: Skin is warm and dry.  Neurological:     Mental Status: She is alert.  Psychiatric:        Mood and Affect: Mood normal.    Labs reviewed: Basic Metabolic Panel: Recent Labs    05/18/21 0816 11/25/21 0839  NA 138 138  K 4.6 4.0  CL 103 102  CO2 25 28  GLUCOSE 88 94  BUN 19 12  CREATININE 0.73 0.65  CALCIUM 9.1 9.3   Liver Function Tests: Recent Labs    05/18/21 0816 11/25/21 0839  AST 16 17  ALT 15 14  BILITOT 0.3 0.7  PROT 6.7 6.7   No results for input(s): LIPASE, AMYLASE in the last 8760 hours. No results for input(s): AMMONIA in the last 8760 hours. CBC: Recent Labs    05/18/21 0816 11/25/21 0839  WBC 7.5 4.6  NEUTROABS  4,748 2,171  HGB 13.6 12.7  HCT 41.7 37.9  MCV 96.1 93.3  PLT 253 231   Lipid Panel: Recent Labs    05/18/21 0816 11/25/21 0839  CHOL 216* 206*  HDL 73 71  LDLCALC 119* 116*  TRIG 127 93  CHOLHDL 3.0 2.9  TSH: No results for input(s): TSH in the last 8760 hours. A1C: No results found for: HGBA1C   Assessment/Plan 1. Mood disorder ([place]) -stable on effexor. Continue medication and lifestyle modifications.   2. Insomnia, unspecified type The current medical regimen is effective;  continue present plan and medications.  3. Mixed hyperlipidemia LDL stable on crestor, continue medication and dietary modifications.   4. Gastroesophageal reflux disease without esophagitis Stable on pepcid daily  5. Osteoarthritis of left shoulder, unspecified osteoarthritis type Stable, continue current regimen.  6. Osteopenia of multiple sites -Recommended to take calcium 600 mg twice daily with Vitamin D 2000 units daily and weight bearing activity 30 mins/5 days a week    Return in about 6 months (around 05/27/2022) for routine follow up.Carlos American. [place], [facility] [phone]

## 2022-01-17 ENCOUNTER — Other Ambulatory Visit: Payer: Self-pay | Admitting: Nurse Practitioner

## 2022-01-17 DIAGNOSIS — E782 Mixed hyperlipidemia: Secondary | ICD-10-CM

## 2022-01-22 ENCOUNTER — Ambulatory Visit: Payer: Medicare Other | Admitting: Surgical

## 2022-01-22 DIAGNOSIS — M19012 Primary osteoarthritis, left shoulder: Secondary | ICD-10-CM | POA: Diagnosis not present

## 2022-01-24 ENCOUNTER — Encounter: Payer: Self-pay | Admitting: Orthopedic Surgery

## 2022-01-24 ENCOUNTER — Other Ambulatory Visit: Payer: Self-pay | Admitting: Nurse Practitioner

## 2022-01-24 MED ORDER — LIDOCAINE HCL 1 % IJ SOLN
5.0000 mL | INTRAMUSCULAR | Status: AC | PRN
  Administered 2022-01-22: 5 mL

## 2022-01-24 MED ORDER — BUPIVACAINE HCL 0.5 % IJ SOLN
9.0000 mL | INTRAMUSCULAR | Status: AC | PRN
  Administered 2022-01-22: 9 mL via INTRA_ARTICULAR

## 2022-01-24 MED ORDER — METHYLPREDNISOLONE ACETATE 40 MG/ML IJ SUSP
40.0000 mg | INTRAMUSCULAR | Status: AC | PRN
  Administered 2022-01-22: 40 mg via INTRA_ARTICULAR

## 2022-01-24 NOTE — Progress Notes (Signed)
? ?Office Visit Note ?  ?Patient: Theresa Pena           ?Date of Birth: Feb 21, 1951           ?MRN: [id number] ?Visit Date: 01/22/2022 ?Requested by: Lauree Chandler, NP ?[address] ?[place],  [place] [id number] ?PCP: Lauree Chandler, NP ? ?Subjective: ?Chief Complaint  ?Patient presents with  ? Left Shoulder - Pain  ? ? ?HPI: Theresa Pena is a 71 y.o. female who presents to the office complaining of left shoulder pain.  Patient has history of left shoulder glenohumeral arthritis.  Last injection was 09/10/2021.  Provided about 3 months of good relief.  Feels these injections are somewhat losing their effectiveness but still giving her good relief for extended period of time.  Pain continues to wake her up at night and cause increased pain with range of motion.  No new injuries.  She is right right-hand-dominant.  No radicular pain or numbness/tingling..   ?             ?ROS: All systems reviewed are negative as they relate to the chief complaint within the history of present illness.  Patient denies fevers or chills. ? ?Assessment & Plan: ?Visit Diagnoses:  ?1. Primary osteoarthritis, left shoulder   ? ? ?Plan: Patient is a 71 year old female who presents s/p left shoulder glenohumeral injection in December 2022.  Provided 3 months of relief.  She would like to try another injection.  She has history of glenohumeral arthritis.  Injection administered and patient tolerated the procedure well.  She may follow-up in 3 to 4 months as needed which would be the soonest time to try another injection.  Patient agreed with plan.  She wants to hold off on any surgery.  Follow-up as needed. ? ?Follow-Up Instructions: No follow-ups on file.  ? ?Orders:  ?No orders of the defined types were placed in this encounter. ? ?No orders of the defined types were placed in this encounter. ? ? ? ? Procedures: ?Large Joint Inj: L glenohumeral on 01/22/2022 1:24 PM ?Indications: diagnostic evaluation and pain ?Details: 18 G 1.5 in  needle, posterior approach ? ?Arthrogram: No ? ?Medications: 9 mL bupivacaine 0.5 %; 40 mg methylPREDNISolone acetate 40 MG/ML; 5 mL lidocaine 1 % ?Outcome: tolerated well, no immediate complications ?Procedure, treatment alternatives, risks and benefits explained, specific risks discussed. Consent was given by the patient. Immediately prior to procedure a time out was called to verify the correct patient, procedure, equipment, support staff and site/side marked as required. Patient was prepped and draped in the usual sterile fashion.  ? ? ? ? ?Clinical Data: ?No additional findings. ? ?Objective: ?Vital Signs: There were no vitals taken for this visit. ? ?Physical Exam:  ?Constitutional: Patient appears well-developed ?HEENT:  ?Head: Normocephalic ?Eyes:EOM are normal ?Neck: Normal range of motion ?Cardiovascular: Normal rate ?Pulmonary/chest: Effort normal ?Neurologic: Patient is alert ?Skin: Skin is warm ?Psychiatric: Patient has normal mood and affect ? ?Ortho Exam: Ortho exam demonstrates left shoulder with 10 degrees external rotation, 75 degrees abduction, 110 degrees forward flexion.  Good rotator cuff strength.  Axillary nerve intact with deltoid firing.  No evidence of cellulitis or skin changes overlying the shoulder. ? ?Specialty Comments:  ?No specialty comments available. ? ?Imaging: ?No results found. ? ? ?PMFS History: ?Patient Active Problem List  ? Diagnosis Date Noted  ? Mixed hyperlipidemia 09/23/2017  ? High risk medication use 09/23/2017  ? Insomnia 09/23/2017  ? History of breast cancer in  female 03/14/2015  ? Mood disorder ([place]) 02/24/2012  ? ?Past Medical History:  ?Diagnosis Date  ? Cancer [facility]) 2001  ? breast   ? Cataracts, bilateral   ? GERD (gastroesophageal reflux disease)   ? Hyperlipidemia   ?  ?Family History  ?Problem Relation Age of Onset  ? Hyperlipidemia Mother   ? Alzheimer's disease Sister   ? Esophageal cancer Cousin 73  ? Colon cancer Neg Hx   ? Stomach cancer Neg Hx   ?   ?Past Surgical History:  ?Procedure Laterality Date  ? BREAST SURGERY    ? RADIAL KERATOTOMY    ? ?Social History  ? ?Occupational History  ? Not on file  ?Tobacco Use  ? Smoking status: Former  ?  Types: Cigarettes  ?  Quit date: 10/04/1990  ?  Years since quitting: 31.3  ? Smokeless tobacco: Never  ?Vaping Use  ? Vaping Use: Never used  ?Substance and Sexual Activity  ? Alcohol use: Yes  ?  Alcohol/week: 13.0 - 15.0 standard drinks  ?  Types: 8 Glasses of wine, 5 - 7 Standard drinks or equivalent per week  ? Drug use: No  ? Sexual activity: Not on file  ? ? ? ? ? j ?

## 2022-01-25 ENCOUNTER — Other Ambulatory Visit: Payer: Self-pay | Admitting: Nurse Practitioner

## 2022-02-16 ENCOUNTER — Encounter: Payer: Self-pay | Admitting: Gastroenterology

## 2022-02-25 DIAGNOSIS — Z1231 Encounter for screening mammogram for malignant neoplasm of breast: Secondary | ICD-10-CM | POA: Diagnosis not present

## 2022-02-25 LAB — HM MAMMOGRAPHY

## 2022-02-26 ENCOUNTER — Encounter: Payer: Self-pay | Admitting: Nurse Practitioner

## 2022-05-03 ENCOUNTER — Other Ambulatory Visit: Payer: Self-pay | Admitting: Nurse Practitioner

## 2022-05-03 DIAGNOSIS — F39 Unspecified mood [affective] disorder: Secondary | ICD-10-CM

## 2022-06-02 ENCOUNTER — Other Ambulatory Visit: Payer: Medicare Other

## 2022-06-03 ENCOUNTER — Encounter: Payer: Self-pay | Admitting: Nurse Practitioner

## 2022-06-04 ENCOUNTER — Ambulatory Visit (INDEPENDENT_AMBULATORY_CARE_PROVIDER_SITE_OTHER): Payer: Medicare Other | Admitting: Nurse Practitioner

## 2022-06-04 ENCOUNTER — Encounter: Payer: Self-pay | Admitting: Nurse Practitioner

## 2022-06-04 VITALS — BP 130/82 | HR 62 | Temp 97.1°F | Ht 68.0 in | Wt 181.0 lb

## 2022-06-04 DIAGNOSIS — E782 Mixed hyperlipidemia: Secondary | ICD-10-CM | POA: Diagnosis not present

## 2022-06-04 DIAGNOSIS — K219 Gastro-esophageal reflux disease without esophagitis: Secondary | ICD-10-CM

## 2022-06-04 DIAGNOSIS — M8589 Other specified disorders of bone density and structure, multiple sites: Secondary | ICD-10-CM

## 2022-06-04 DIAGNOSIS — G4700 Insomnia, unspecified: Secondary | ICD-10-CM | POA: Diagnosis not present

## 2022-06-04 DIAGNOSIS — F39 Unspecified mood [affective] disorder: Secondary | ICD-10-CM

## 2022-06-04 DIAGNOSIS — M19012 Primary osteoarthritis, left shoulder: Secondary | ICD-10-CM | POA: Diagnosis not present

## 2022-06-04 NOTE — Patient Instructions (Addendum)
To get high dose flu shot in September or October, can call local pharmacy or get here later in the month   Check about colonoscopy   Tdap at local pharmacy

## 2022-06-04 NOTE — Progress Notes (Signed)
Careteam: Patient Care Team: Lauree Chandler, NP as PCP - General (Geriatric Medicine) Webb Laws, [place] as Referring Physician (Optometry) Druscilla Brownie, MD as Consulting Physician (Dermatology)  PLACE OF SERVICE:  [facility] Directive information Does Patient Have a Medical Advance Directive?: No, Would patient like information on creating a medical advance directive?: No - Patient declined  Allergies  Allergen Reactions   Codeine Nausea Only    Also has dizziness   Omeprazole Other (See Comments)    GI upset     Chief Complaint  Patient presents with   Medical Management of Chronic Issues    6 month follow-up. Discuss need for td/tdap, additional covid boosters, shingrix, colonoscopy, and flu vaccine (out of stock). [facility] verified.      HPI: Patient is a 71 y.o. female for routine follow up. She has no acute concerns. reportss she is tired today.    Left shoulder pain: Was doing steroid shots around q4 months, but is now trying to hold off on going because does not want to gain weight. States the pain is tolerable and flares up with the weather.   Insomnia: She is sleeping well, only takes [place] once in a while.   Mood: Has her up and downs. She helps out with her sister that has Alzheimer's and endorses that as a significant stressor for her. Does not want to make any medication adjustments  Gerd:She has been weaning herself off prilosec. She only takes half of the pill daily and states her symptoms are controlled.    Diet/exercise: States she is eating better. Having a lot more salads. She was  walking 2-3 times a week, but has recently only been going about once a week. 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   She endorses some episodes of feeling like her heart is going to jump out of her chest associated with some hyperventilating and tremors. States she was evaluated by a doctor a few years ago  that thinks the episodes are more anxiety related and not cardiac related. Pt agrees.    She follows with dermatology q 6 mos.    Review of Systems:  Review of Systems  Constitutional:  Negative for chills, fever and weight loss.  HENT:  Negative for congestion, ear pain and hearing loss.   Eyes:  Negative for blurred vision.  Respiratory:  Negative for cough and shortness of breath.   Cardiovascular:  Positive for palpitations. Negative for chest pain and leg swelling.  Gastrointestinal:  Negative for abdominal pain, constipation, diarrhea, heartburn and nausea.  Genitourinary:  Negative for dysuria and hematuria.  Musculoskeletal:  Negative for back pain, falls and joint pain.  Skin:  Negative for itching and rash.  Neurological:  Negative for dizziness.  Psychiatric/Behavioral:  Negative for depression. The patient is nervous/anxious. The patient does not have insomnia.     Past Medical History:  Diagnosis Date   Cancer ([place]) 2001   breast    Cataracts, bilateral    GERD (gastroesophageal reflux disease)    Hyperlipidemia    Past Surgical History:  Procedure Laterality Date   BREAST SURGERY     RADIAL KERATOTOMY     Social History:   reports that she quit smoking about 31 years ago. Her smoking use included cigarettes. She has never used smokeless tobacco. She reports current alcohol use of about 13.0 - 15.0 standard drinks of alcohol per week. She reports that  she does not use drugs.  Family History  Problem Relation Age of Onset   Hyperlipidemia Mother    Alzheimer's disease Sister    Esophageal cancer Cousin 70   Colon cancer Neg Hx    Stomach cancer Neg Hx     Medications: Patient's Medications  New Prescriptions   No medications on file  Previous Medications   CHOLECALCIFEROL (VITAMIN D3 PO)    Take 500 Units by mouth daily.   CYANOCOBALAMIN (VITAMIN B 12 PO)    Take 1 tablet by mouth.   FAMOTIDINE (PEPCID) 20 MG TABLET    TAKE 1/2 TABLET BY MOUTH EVERY  MORNING AND 1 TABLET EVERY EVENING   FISH OIL-OMEGA-3 FATTY ACIDS 1000 MG CAPSULE    Take 1 g by mouth daily.    MISC NATURAL PRODUCTS (GLUCOSAMINE CHONDROITIN ADV PO)    Take 1 tablet by mouth 2 (two) times daily.   MULTIPLE VITAMIN (MULTIVITAMIN) TABLET    Take 1 tablet by mouth daily.   ROSUVASTATIN (CRESTOR) 5 MG TABLET    TAKE 1 TABLET(5 MG) BY MOUTH DAILY   UNABLE TO FIND    Med Name: Bone Up Calcium  supplement 333.3 mg , two- three times daily   VENLAFAXINE (EFFEXOR) 75 MG TABLET    TAKE 1 TABLET(75 MG) BY MOUTH TWICE DAILY   ZOLPIDEM (AMBIEN) 5 MG TABLET    TAKE 1/2 TABLET(2.5 MG) BY MOUTH AT BEDTIME AS NEEDED FOR SLEEP  Modified Medications   No medications on file  Discontinued Medications   No medications on file    Physical Exam:  There were no vitals filed for this visit. There is no height or weight on file to calculate BMI. Wt Readings from Last 3 Encounters:  11/27/21 82.8 kg  05/22/21 81.6 kg  11/21/20 82.9 kg    Physical Exam Constitutional:      General: She is not in acute distress. HENT:     Right Ear: Tympanic membrane, ear canal and external ear normal. There is no impacted cerumen.     Left Ear: Tympanic membrane, ear canal and external ear normal. There is no impacted cerumen.     Mouth/Throat:     Mouth: Mucous membranes are moist.     Pharynx: Oropharynx is clear.  Eyes:     Conjunctiva/sclera: Conjunctivae normal.     Pupils: Pupils are equal, round, and reactive to light.  Cardiovascular:     Rate and Rhythm: Normal rate and regular rhythm.     Pulses: Normal pulses.     Heart sounds: Normal heart sounds. No murmur heard. Pulmonary:     Effort: Pulmonary effort is normal. No respiratory distress.     Breath sounds: Normal breath sounds. No wheezing.  Abdominal:     General: Bowel sounds are normal.     Palpations: Abdomen is soft.     Tenderness: There is no abdominal tenderness. There is no guarding.  Musculoskeletal:     Right lower leg:  No edema.     Left lower leg: No edema.  Skin:    General: Skin is warm and dry.  Neurological:     Mental Status: She is alert and oriented to person, place, and time. Mental status is at baseline.     Motor: No weakness.  Psychiatric:        Mood and Affect: Mood normal.        Behavior: Behavior normal.     Labs reviewed: Basic Metabolic Panel: Recent Labs  11/25/21 0839  NA 138  K 4.0  CL 102  CO2 28  GLUCOSE 94  BUN 12  CREATININE 0.65  CALCIUM 9.3   Liver Function Tests: Recent Labs    11/25/21 0839  AST 17  ALT 14  BILITOT 0.7  PROT 6.7   No results for input(s): "LIPASE", "AMYLASE" in the last 8760 hours. No results for input(s): "AMMONIA" in the last 8760 hours. CBC: Recent Labs    11/25/21 0839  WBC 4.6  NEUTROABS 2,171  HGB 12.7  HCT 37.9  MCV 93.3  PLT 231   Lipid Panel: Recent Labs    11/25/21 0839  CHOL 206*  HDL 71  LDLCALC 116*  TRIG 93  CHOLHDL 2.9   TSH: No results for input(s): "TSH" in the last 8760 hours. A1C: No results found for: "HGBA1C"   Assessment/Plan 1. Insomnia, unspecified type - controlled - continue ambien   2. Mixed hyperlipidemia - continue Crestor with dietary modifications.  - CMP with eGFR([facility]) - Lipid panel  3. Gastroesophageal reflux disease without esophagitis - Controlled  - Continue pepcid, using less. Continue dietary modifications.  - CBC with Differential/Platelet - CMP with eGFR([facility])  4. Osteoarthritis of left shoulder, unspecified osteoarthritis type - stable, follows with orthopedics  - recommended ice packs and tylenol as alternatives to steroid shots  - CBC with Differential/Platelet - CMP with eGFR([facility])  5. Mood disorder (HCC) - stable, continue Effexor with lifestyle modifications.   6. Osteopenia of multiple sites - recommend weight bearing exercises  - continue vitamin D - CMP with eGFR([facility])   Return in about 6 months (around 12/03/2022) for routine follow up .  Labs at appt.   Student- Waunita Schooner, RN I personally was present during the history, physical exam and medical decision-making activities of this service and have verified that the service and findings are accurately documented in the student's note  Kayman Snuffer K. [place], [facility] [phone]

## 2022-06-05 LAB — COMPLETE METABOLIC PANEL WITH GFR
AG Ratio: 2 (calc) (ref 1.0–2.5)
ALT: 16 U/L (ref 6–29)
AST: 17 U/L (ref 10–35)
Albumin: 4.3 g/dL (ref 3.6–5.1)
Alkaline phosphatase (APISO): 48 U/L (ref 37–153)
BUN: 14 mg/dL (ref 7–25)
CO2: 26 mmol/L (ref 20–32)
Calcium: 9.3 mg/dL (ref 8.6–10.4)
Chloride: 104 mmol/L (ref 98–110)
Creat: 0.76 mg/dL (ref 0.60–1.00)
Globulin: 2.1 g/dL (calc) (ref 1.9–3.7)
Glucose, Bld: 92 mg/dL (ref 65–99)
Potassium: 4.8 mmol/L (ref 3.5–5.3)
Sodium: 137 mmol/L (ref 135–146)
Total Bilirubin: 0.4 mg/dL (ref 0.2–1.2)
Total Protein: 6.4 g/dL (ref 6.1–8.1)
eGFR: 84 mL/min/{1.73_m2} (ref >=60)

## 2022-06-05 LAB — CBC WITH DIFFERENTIAL/PLATELET
Absolute Monocytes: 456 cells/uL (ref 200–950)
Basophils Absolute: 19 cells/uL (ref 0–200)
Basophils Relative: 0.4 %
Eosinophils Absolute: 91 cells/uL (ref 15–500)
Eosinophils Relative: 1.9 %
HCT: 39.3 % (ref 35.0–45.0)
Hemoglobin: 13.2 g/dL (ref 11.7–15.5)
Lymphs Abs: 1920 cells/uL (ref 850–3900)
MCH: 31.4 pg (ref 27.0–33.0)
MCHC: 33.6 g/dL (ref 32.0–36.0)
MCV: 93.6 fL (ref 80.0–100.0)
MPV: 11.9 fL (ref 7.5–12.5)
Monocytes Relative: 9.5 %
Neutro Abs: 2314 cells/uL (ref 1500–7800)
Neutrophils Relative %: 48.2 %
Platelets: 240 10*3/uL (ref 140–400)
RBC: 4.2 10*6/uL (ref 3.80–5.10)
RDW: 12.4 % (ref 11.0–15.0)
Total Lymphocyte: 40 %
WBC: 4.8 10*3/uL (ref 3.8–10.8)

## 2022-06-05 LAB — LIPID PANEL
Cholesterol: 201 mg/dL — ABNORMAL HIGH (ref <200)
HDL: 73 mg/dL (ref >=50)
LDL Cholesterol (Calc): 110 mg/dL (calc) — ABNORMAL HIGH
Non-HDL Cholesterol (Calc): 128 mg/dL (calc) (ref <130)
Total CHOL/HDL Ratio: 2.8 (calc) (ref <5.0)
Triglycerides: 89 mg/dL (ref <150)

## 2022-07-27 ENCOUNTER — Other Ambulatory Visit: Payer: Self-pay | Admitting: Nurse Practitioner

## 2022-07-27 DIAGNOSIS — E782 Mixed hyperlipidemia: Secondary | ICD-10-CM

## 2022-07-27 DIAGNOSIS — F39 Unspecified mood [affective] disorder: Secondary | ICD-10-CM

## 2022-07-29 ENCOUNTER — Ambulatory Visit: Payer: Medicare Other | Admitting: Orthopedic Surgery

## 2022-07-29 ENCOUNTER — Ambulatory Visit: Payer: Self-pay

## 2022-07-29 ENCOUNTER — Encounter: Payer: Self-pay | Admitting: Orthopedic Surgery

## 2022-07-29 DIAGNOSIS — M19012 Primary osteoarthritis, left shoulder: Secondary | ICD-10-CM | POA: Diagnosis not present

## 2022-07-29 NOTE — Progress Notes (Signed)
Office Visit Note   Patient: Theresa Pena           Date of Birth: [birth year]           MRN: [id number] Visit Date: 07/29/2022 Requested by: Lauree Chandler, NP [place],  [place] [address] PCP: Lauree Chandler, NP  Subjective: Chief Complaint  Patient presents with   Left Shoulder - Pain    HPI: Francia Verry is a 71 y.o. female who presents to the office reporting left shoulder pain.  Patient returns following left shoulder glenohumeral injection on 01/22/2022 that provided good relief for a couple months.  No new injuries.  She just got to the point where this pain has been aggravating enough that she would like to try another injection.  Not bothering her enough for any sort of surgery.  She cleans houses for living..                ROS: All systems reviewed are negative as they relate to the chief complaint within the history of present illness.  Patient denies fevers or chills.  Assessment & Plan: Visit Diagnoses:  1. Primary osteoarthritis, left shoulder     Plan: Patient is a 71 year old female who presents for left shoulder glenohumeral injection.  She has history of left shoulder glenohumeral arthritis but has been doing well with occasional injections when her pain spikes up.  She would like to continue with this and avoid surgery for as long as possible.  Under ultrasound guidance, injection was successfully delivered into the glenohumeral joint.  She tolerated the procedure well.  She will follow-up with the office as needed.  Follow-Up Instructions: No follow-ups on file.   Orders:  Orders Placed This Encounter  Procedures   US Guided Needle Placement - No Linked Charges   No orders of the defined types were placed in this encounter.     Procedures: Large Joint Inj: L glenohumeral on 07/29/2022 9:53 PM Indications: diagnostic evaluation and pain Details: 22 G 1.5 in and 3.5 in needle, ultrasound-guided posterior approach  Arthrogram:  No  Medications: 9 mL bupivacaine 0.5 %; 40 mg methylPREDNISolone acetate 40 MG/ML; 5 mL lidocaine 1 % Outcome: tolerated well, no immediate complications Procedure, treatment alternatives, risks and benefits explained, specific risks discussed. Consent was given by the patient. Immediately prior to procedure a time out was called to verify the correct patient, procedure, equipment, support staff and site/side marked as required. Patient was prepped and draped in the usual sterile fashion.       Clinical Data: No additional findings.  Objective: Vital Signs: There were no vitals taken for this visit.  Physical Exam:  Constitutional: Patient appears well-developed HEENT:  Head: Normocephalic Eyes:EOM are normal Neck: Normal range of motion Cardiovascular: Normal rate Pulmonary/chest: Effort normal Neurologic: Patient is alert Skin: Skin is warm Psychiatric: Patient has normal mood and affect  Ortho Exam: Ortho exam demonstrates left shoulder with 20 degrees external rotation, 80 degrees abduction, 90 degrees forward flexion.  Axillary nerve intact with deltoid firing.  Good rotator cuff strength.  No cellulitis or skin changes noted.  Palpable radial pulse of the left upper extremity.  Specialty Comments:  No specialty comments available.  Imaging: No results found.   PMFS History: Patient Active Problem List   Diagnosis Date Noted   Mixed hyperlipidemia 09/23/2017   High risk medication use 09/23/2017   Insomnia 09/23/2017   History of breast cancer in female 03/14/2015   Mood disorder (  [place]) 02/24/2012   Past Medical History:  Diagnosis Date   Cancer ([place]) 2001   breast    Cataracts, bilateral    GERD (gastroesophageal reflux disease)    Hyperlipidemia     Family History  Problem Relation Age of Onset   Hyperlipidemia Mother    Alzheimer's disease Sister    Esophageal cancer Cousin 36   Colon cancer Neg Hx    Stomach cancer Neg Hx     Past Surgical  History:  Procedure Laterality Date   BREAST SURGERY     RADIAL KERATOTOMY     Social History   Occupational History   Not on file  Tobacco Use   Smoking status: Former    Types: Cigarettes    Quit date: 10/04/1990    Years since quitting: 31.8   Smokeless tobacco: Never  Vaping Use   Vaping Use: Never used  Substance and Sexual Activity   Alcohol use: Yes    Alcohol/week: 13.0 - 15.0 standard drinks of alcohol    Types: 8 Glasses of wine, 5 - 7 Standard drinks or equivalent per week   Drug use: No   Sexual activity: Not on file

## 2022-07-31 ENCOUNTER — Encounter: Payer: Self-pay | Admitting: Orthopedic Surgery

## 2022-07-31 MED ORDER — METHYLPREDNISOLONE ACETATE 40 MG/ML IJ SUSP
40.0000 mg | INTRAMUSCULAR | Status: AC | PRN
  Administered 2022-07-29: 40 mg via INTRA_ARTICULAR

## 2022-07-31 MED ORDER — BUPIVACAINE HCL 0.5 % IJ SOLN
9.0000 mL | INTRAMUSCULAR | Status: AC | PRN
  Administered 2022-07-29: 9 mL via INTRA_ARTICULAR

## 2022-07-31 MED ORDER — LIDOCAINE HCL 1 % IJ SOLN
5.0000 mL | INTRAMUSCULAR | Status: AC | PRN
  Administered 2022-07-29: 5 mL

## 2022-08-24 ENCOUNTER — Encounter: Payer: Medicare Other | Admitting: Nurse Practitioner

## 2022-08-24 ENCOUNTER — Encounter: Payer: Self-pay | Admitting: Nurse Practitioner

## 2022-08-24 NOTE — Patient Instructions (Signed)
Theresa Pena , Thank you for taking time to come for your Medicare Wellness Visit. I appreciate your ongoing commitment to your health goals. Please review the following plan we discussed and let me know if I can assist you in the future.   Screening recommendations/referrals: Colonoscopy -overdue- to make appt  Mammogram up to date Bone Density up to date Recommended yearly ophthalmology/optometry visit for glaucoma screening and checkup Recommended yearly dental visit for hygiene and checkup  Vaccinations: Influenza vaccine- due annually in September/October Pneumococcal vaccine up to date Tdap vaccine up to date Shingles vaccine up to date    Advanced directives: to bring most updated documents to office to place on file.   Conditions/risks identified: advanced age  Next appointment: yearly- next in person    Preventive Care 72 Years and Older, Female Preventive care refers to lifestyle choices and visits with your health care provider that can promote health and wellness. What does preventive care include? A yearly physical exam. This is also called an annual well check. Dental exams once or twice a year. Routine eye exams. Ask your health care provider how often you should have your eyes checked. Personal lifestyle choices, including: Daily care of your teeth and gums. Regular physical activity. Eating a healthy diet. Avoiding tobacco and drug use. Limiting alcohol use. Practicing safe sex. Taking low-dose aspirin every day. Taking vitamin and mineral supplements as recommended by your health care provider. What happens during an annual well check? The services and screenings done by your health care provider during your annual well check will depend on your age, overall health, lifestyle risk factors, and family history of disease. Counseling  Your health care provider may ask you questions about your: Alcohol use. Tobacco use. Drug use. Emotional well-being. Home  and relationship well-being. Sexual activity. Eating habits. History of falls. Memory and ability to understand (cognition). Work and work Statistician. Reproductive health. Screening  You may have the following tests or measurements: Height, weight, and BMI. Blood pressure. Lipid and cholesterol levels. These may be checked every 5 years, or more frequently if you are over 72 years old. Skin check. Lung cancer screening. You may have this screening every year starting at age 72 if you have a 30-pack-year history of smoking and currently smoke or have quit within the past 15 years. Fecal occult blood test (FOBT) of the stool. You may have this test every year starting at age 72. Flexible sigmoidoscopy or colonoscopy. You may have a sigmoidoscopy every 5 years or a colonoscopy every 10 years starting at age 72. Hepatitis C blood test. Hepatitis B blood test. Sexually transmitted disease (STD) testing. Diabetes screening. This is done by checking your blood sugar (glucose) after you have not eaten for a while (fasting). You may have this done every 1-3 years. Bone density scan. This is done to screen for osteoporosis. You may have this done starting at age 72. Mammogram. This may be done every 1-2 years. Talk to your health care provider about how often you should have regular mammograms. Talk with your health care provider about your test results, treatment options, and if necessary, the need for more tests. Vaccines  Your health care provider may recommend certain vaccines, such as: Influenza vaccine. This is recommended every year. Tetanus, diphtheria, and acellular pertussis (Tdap, Td) vaccine. You may need a Td booster every 10 years. Zoster vaccine. You may need this after age 72. Pneumococcal 13-valent conjugate (PCV13) vaccine. One dose is recommended after age 72. Pneumococcal  polysaccharide (PPSV23) vaccine. One dose is recommended after age 72. Talk to your health care provider  about which screenings and vaccines you need and how often you need them. This information is not intended to replace advice given to you by your health care provider. Make sure you discuss any questions you have with your health care provider. Document Released: 10/17/2015 Document Revised: 06/09/2016 Document Reviewed: 07/22/2015 Elsevier Interactive Patient Education  2017 [place] Prevention in the Home Falls can cause injuries. They can happen to people of all ages. There are many things you can do to make your home safe and to help prevent falls. What can I do on the outside of my home? Regularly fix the edges of walkways and driveways and fix any cracks. Remove anything that might make you trip as you walk through a door, such as a raised step or threshold. Trim any bushes or trees on the path to your home. Use bright outdoor lighting. Clear any walking paths of anything that might make someone trip, such as rocks or tools. Regularly check to see if handrails are loose or broken. Make sure that both sides of any steps have handrails. Any raised decks and porches should have guardrails on the edges. Have any leaves, snow, or ice cleared regularly. Use sand or salt on walking paths during winter. Clean up any spills in your garage right away. This includes oil or grease spills. What can I do in the bathroom? Use night lights. Install grab bars by the toilet and in the tub and shower. Do not use towel bars as grab bars. Use non-skid mats or decals in the tub or shower. If you need to sit down in the shower, use a plastic, non-slip stool. Keep the floor dry. Clean up any water that spills on the floor as soon as it happens. Remove soap buildup in the tub or shower regularly. Attach bath mats securely with double-sided non-slip rug tape. Do not have throw rugs and other things on the floor that can make you trip. What can I do in the bedroom? Use night lights. Make sure  that you have a light by your bed that is easy to reach. Do not use any sheets or blankets that are too big for your bed. They should not hang down onto the floor. Have a firm chair that has side arms. You can use this for support while you get dressed. Do not have throw rugs and other things on the floor that can make you trip. What can I do in the kitchen? Clean up any spills right away. Avoid walking on wet floors. Keep items that you use a lot in easy-to-reach places. If you need to reach something above you, use a strong step stool that has a grab bar. Keep electrical cords out of the way. Do not use floor polish or wax that makes floors slippery. If you must use wax, use non-skid floor wax. Do not have throw rugs and other things on the floor that can make you trip. What can I do with my stairs? Do not leave any items on the stairs. Make sure that there are handrails on both sides of the stairs and use them. Fix handrails that are broken or loose. Make sure that handrails are as long as the stairways. Check any carpeting to make sure that it is firmly attached to the stairs. Fix any carpet that is loose or worn. Avoid having throw rugs at the top  or bottom of the stairs. If you do have throw rugs, attach them to the floor with carpet tape. Make sure that you have a light switch at the top of the stairs and the bottom of the stairs. If you do not have them, ask someone to add them for you. What else can I do to help prevent falls? Wear shoes that: Do not have high heels. Have rubber bottoms. Are comfortable and fit you well. Are closed at the toe. Do not wear sandals. If you use a stepladder: Make sure that it is fully opened. Do not climb a closed stepladder. Make sure that both sides of the stepladder are locked into place. Ask someone to hold it for you, if possible. Clearly mark and make sure that you can see: Any grab bars or handrails. First and last steps. Where the edge of  each step is. Use tools that help you move around (mobility aids) if they are needed. These include: Canes. Walkers. Scooters. Crutches. Turn on the lights when you go into a dark area. Replace any light bulbs as soon as they burn out. Set up your furniture so you have a clear path. Avoid moving your furniture around. If any of your floors are uneven, fix them. If there are any pets around you, be aware of where they are. Review your medicines with your doctor. Some medicines can make you feel dizzy. This can increase your chance of falling. Ask your doctor what other things that you can do to help prevent falls. This information is not intended to replace advice given to you by your health care provider. Make sure you discuss any questions you have with your health care provider. Document Released: 07/17/2009 Document Revised: 02/26/2016 Document Reviewed: 10/25/2014 Elsevier Interactive Patient Education  2017 [employer].

## 2022-08-24 NOTE — Progress Notes (Unsigned)
Subjective:   Theresa Pena is a 71 y.o. female who presents for Medicare Annual (Subsequent) preventive examination.  Review of Systems           Objective:    There were no vitals filed for this visit. There is no height or weight on file to calculate BMI.     06/03/2022    2:55 PM 11/27/2021   10:12 AM 09/30/2021   10:41 AM 08/21/2021    9:11 AM 05/22/2021   10:34 AM 11/21/2020   10:59 AM 05/16/2020   11:07 AM  Advanced Directives  Does Patient Have a Medical Advance Directive? Yes Yes Yes Yes Yes Yes Yes  Type of Advance Directive Living will;Healthcare Power of Ward will Living will [place];Living will [place];Living will [place];Living will  Does patient want to make changes to medical advance directive? No - Patient declined No - Patient declined No - Patient declined No - Patient declined No - Patient declined No - Patient declined No - Patient declined  Copy of [place] in Chart? No - copy requested Yes - validated most recent copy scanned in chart (See row information)   No - copy requested No - copy requested No - copy requested    Current Medications (verified) Outpatient Encounter Medications as of 08/24/2022  Medication Sig   Cholecalciferol (VITAMIN D3 PO) Take 500 Units by mouth daily.   Cyanocobalamin (VITAMIN B 12 PO) Take 1 tablet by mouth.   famotidine (PEPCID) 20 MG tablet Take 10 mg by mouth at bedtime.   fish oil-omega-3 fatty acids 1000 MG capsule Take 1 g by mouth daily.    Misc Natural Products (GLUCOSAMINE CHONDROITIN ADV PO) Take 1 tablet by mouth 2 (two) times daily.   Multiple Vitamin (MULTIVITAMIN) tablet Take 1 tablet by mouth daily.   rosuvastatin (CRESTOR) 5 MG tablet TAKE 1 TABLET(5 MG) BY MOUTH DAILY   UNABLE TO FIND Med Name: Bone Up Calcium  supplement 333.3 mg , two- three times daily   venlafaxine (EFFEXOR) 75 MG  tablet TAKE 1 TABLET(75 MG) BY MOUTH TWICE DAILY   zolpidem (AMBIEN) 5 MG tablet TAKE 1/2 TABLET(2.5 MG) BY MOUTH AT BEDTIME AS NEEDED FOR SLEEP   No facility-administered encounter medications on file as of 08/24/2022.    Allergies (verified) Codeine and Omeprazole   History: Past Medical History:  Diagnosis Date   Cancer ([place]) 2001   breast    Cataracts, bilateral    GERD (gastroesophageal reflux disease)    Hyperlipidemia    Past Surgical History:  Procedure Laterality Date   BREAST SURGERY     RADIAL KERATOTOMY     Family History  Problem Relation Age of Onset   Hyperlipidemia Mother    Alzheimer's disease Sister    Esophageal cancer Cousin 78   Colon cancer Neg Hx    Stomach cancer Neg Hx    Social History   Socioeconomic History   Marital status: Divorced    Spouse name: Not on file   Number of children: Not on file   Years of education: Not on file   Highest education level: Not on file  Occupational History   Not on file  Tobacco Use   Smoking status: Former    Types: Cigarettes    Quit date: 10/04/1990    Years since quitting: 31.9   Smokeless tobacco: Never  Vaping Use   Vaping Use: Never  used  Substance and Sexual Activity   Alcohol use: Yes    Alcohol/week: 13.0 - 15.0 standard drinks of alcohol    Types: 8 Glasses of wine, 5 - 7 Standard drinks or equivalent per week   Drug use: No   Sexual activity: Not on file  Other Topics Concern   Not on file  Social History Narrative   Diet:      Do you drink/ eat things with caffeine? Coffee 2-3 cups /day      Marital status:  Divorced                             What year were you married ? 2004      Do you live in a house, apartment,assistred living, condo, trailer, etc.)? House      Is it one or more stories? 1      How many persons live in your home ? 1      Do you have any pets in your home ?(please list)  1 dog      Current or past profession: Residential cleaning      Do you exercise?                               Type & how often: Rarely      Do you have a living will? Yes      Do you have a DNR form?   No                    If not, do you want to discuss one? Yes      Do you have signed POA?HPOA forms?   Yes              If so, please bring to your        appointment      Social Determinants of Health   Financial Resource Strain: Low Risk  (09/23/2017)   Overall Financial Resource Strain (CARDIA)    Difficulty of Paying Living Expenses: Not hard at all  Food Insecurity: No Food Insecurity (09/23/2017)   Hunger Vital Sign    Worried About Running Out of Food in the Last Year: Never true    Ran Out of Food in the Last Year: Never true  Transportation Needs: No Transportation Needs (09/23/2017)   PRAPARE - Hydrologist (Medical): No    Lack of Transportation (Non-Medical): No  Physical Activity: Sufficiently Active (09/23/2017)   Exercise Vital Sign    Days of Exercise per Week: 4 days    Minutes of Exercise per Session: 60 min  Stress: No Stress Concern Present (09/23/2017)   [place]    Feeling of Stress : Only a little  Social Connections: Moderately Isolated (09/23/2017)   Social Connection and Isolation Panel [NHANES]    Frequency of Communication with Friends and Family: Twice a week    Frequency of Social Gatherings with Friends and Family: Once a week    Attends Religious Services: Never    Marine scientist or Organizations: No    Attends Archivist Meetings: Never    Marital Status: Divorced    Tobacco Counseling Counseling given: Not Answered   Clinical Intake:  Diabetic?no         Activities of Daily Living     No data to display          Patient Care Team: Lauree Chandler, NP as PCP - General (Geriatric Medicine) Webb Laws, [place] as Referring Physician (Optometry) Druscilla Brownie, MD as Consulting Physician (Dermatology)  Indicate any recent Medical Services you may have received from other than [facility] providers in the past year (date may be approximate).     Assessment:   This is a routine wellness examination for [place].  Hearing/Vision screen No results found.  Dietary issues and exercise activities discussed:     Goals Addressed   None    Depression Screen    06/04/2022   10:35 AM 11/27/2021   10:28 AM 08/21/2021    9:06 AM 05/22/2021   10:34 AM 05/16/2020   11:05 AM 10/19/2019   10:01 AM 09/29/2018    9:58 AM  PHQ 2/9 Scores  PHQ - 2 Score 0 0 0 0 0 0 0    Fall Risk    06/04/2022   10:35 AM 11/27/2021   10:27 AM 09/30/2021   10:41 AM 08/21/2021    9:09 AM 05/22/2021   10:33 AM  Fall Risk   Falls in the past year? 0 0 0 0 1  Number falls in past yr: 0 0 0 0 0  Injury with Fall? 0 0 0 0 0  Risk for fall due to : No Fall Risks No Fall Risks No Fall Risks No Fall Risks No Fall Risks  Follow up Falls evaluation completed Falls evaluation completed Falls evaluation completed Falls evaluation completed Falls evaluation completed    FALL RISK PREVENTION PERTAINING TO THE HOME:  Any stairs in or around the home? {YES/NO:21197} If so, are there any without handrails? {YES/NO:21197} Home free of loose throw rugs in walkways, pet beds, electrical cords, etc? {YES/NO:21197} Adequate lighting in your home to reduce risk of falls? {YES/NO:21197}  ASSISTIVE DEVICES UTILIZED TO PREVENT FALLS:  Life alert? {YES/NO:21197} Use of a cane, walker or w/c? {YES/NO:21197} Grab bars in the bathroom? {YES/NO:21197} Shower chair or bench in shower? {YES/NO:21197} Elevated toilet seat or a handicapped toilet? {YES/NO:21197}  TIMED UP AND GO:  Was the test performed? No .    Cognitive Function:    09/29/2018   10:10 AM 09/23/2017    9:41 AM 06/18/2016    3:03 PM  MMSE - Mini Mental State Exam  Orientation to time '5 5 5  '$ Orientation to Place '5 5  5  '$ Registration '3 3 3  '$ Attention/ Calculation '5 5 5  '$ Recall '3 2 2  '$ Language- name 2 objects '2 2 2  '$ Language- repeat '1 1 1  '$ Language- follow 3 step command '3 3 3  '$ Language- read & follow direction '1 1 1  '$ Write a sentence '1 1 1  '$ Copy design '1 1 1  '$ Total score '30 29 29        '$ 08/21/2021    9:11 AM 10/19/2019   10:02 AM  6CIT Screen  What Year? 0 points 0 points  What month? 0 points 0 points  What time? 0 points 0 points  Count back from 20 0 points 0 points  Months in reverse 0 points 0 points  Repeat phrase 0 points 0 points  Total Score 0 points 0 points    Immunizations Immunization History  Administered Date(s) Administered   Influenza, High Dose Seasonal PF 08/18/2018   Influenza,  Quadrivalent, Recombinant, Inj, Pf 08/13/2021   Influenza,inj,Quad PF,6+ Mos 06/18/2016, 09/23/2017   Influenza-Unspecified 09/01/2018, 08/10/2021   Moderna SARS-COV2 Booster Vaccination 08/01/2020   Moderna Sars-Covid-2 Vaccination 11/09/2019, 12/12/2019   Pneumococcal Conjugate-13 06/18/2016   Pneumococcal Polysaccharide-23 09/23/2017   Unspecified SARS-COV-2 Vaccination 11/09/2019, 12/12/2019, 08/01/2020   Zoster Recombinat (Shingrix) 08/13/2021, 11/04/2021   Zoster, Live 06/05/2015    TDAP status: Up to date  Flu Vaccine status: Due, Education has been provided regarding the importance of this vaccine. Advised may receive this vaccine at local pharmacy or Health Dept. Aware to provide a copy of the vaccination record if obtained from local pharmacy or Health Dept. Verbalized acceptance and understanding.  Pneumococcal vaccine status: Up to date  Covid-19 vaccine status: Information provided on how to obtain vaccines.   Qualifies for Shingles Vaccine? Yes   Zostavax completed Yes   Shingrix Completed?: Yes  Screening Tests Health Maintenance  Topic Date Due   COLONOSCOPY (Pts 45-48yr Insurance coverage will need to be confirmed)  01/28/2022   INFLUENZA VACCINE  05/04/2022    Medicare Annual Wellness (AWV)  08/21/2022   MAMMOGRAM  02/26/2024   Pneumonia Vaccine 72 Years old  Completed   DEXA SCAN  Completed   Hepatitis C Screening  Completed   Zoster Vaccines- Shingrix  Completed   HPV VACCINES  Aged Out   COVID-19 Vaccine  Discontinued    Health Maintenance  Health Maintenance Due  Topic Date Due   COLONOSCOPY (Pts 45-450yrInsurance coverage will need to be confirmed)  01/28/2022   INFLUENZA VACCINE  05/04/2022   Medicare Annual Wellness (AWV)  08/21/2022    Colorectal cancer screening: Type of screening: Colonoscopy. Completed 01/2017. Repeat every 5 years  Mammogram status: Completed 02/23/22. Repeat every year  Bone Density status: Completed 02/26/21. Results reflect: Bone density results: OSTEOPENIA. Repeat every 2 years.  Lung Cancer Screening: (Low Dose CT Chest recommended if Age 72-80ears, 30 pack-year currently smoking OR have quit w/in 15years.) does not qualify.   Additional Screening:  Hepatitis C Screening: does qualify; Completed 2021   Vision Screening: Recommended annual ophthalmology exams for early detection of glaucoma and other disorders of the eye. Is the patient up to date with their annual eye exam?  Yes  Who is the provider or what is the name of the office in which the patient attends annual eye exams? Dr. McEinar Gipf pt is not established with a provider, would they like to be referred to a provider to establish care? No .   Dental Screening: Recommended annual dental exams for proper oral hygiene  Community Resource Referral / Chronic Care Management: CRR required this visit?  No   CCM required this visit?  No      Plan:     I have personally reviewed and noted the following in the patient's chart:   Medical and social history Use of alcohol, tobacco or illicit drugs  Current medications and supplements including opioid prescriptions. Patient is not currently taking opioid prescriptions. Functional  ability and status Nutritional status Physical activity Advanced directives List of other physicians Hospitalizations, surgeries, and ER visits in previous 12 months Vitals Screenings to include cognitive, depression, and falls Referrals and appointments  In addition, I have reviewed and discussed with patient certain preventive protocols, quality metrics, and best practice recommendations. A written personalized care plan for preventive services as well as general preventive health recommendations were provided to patient.     JeLauree ChandlerNP   08/24/2022  Virtual Visit via Telephone Note  I connected with patient 08/24/22 at 11:00 AM EST by telephone and verified that I am speaking with the correct person using two identifiers.  Location: Patient: home Provider: [facility]    I discussed the limitations, risks, security and privacy concerns of performing an evaluation and management service by telephone and the availability of in person appointments. I also discussed with the patient that there may be a patient responsible charge related to this service. The patient expressed understanding and agreed to proceed.   I discussed the assessment and treatment plan with the patient. The patient was provided an opportunity to ask questions and all were answered. The patient agreed with the plan and demonstrated an understanding of the instructions.   The patient was advised to call back or seek an in-person evaluation if the symptoms worsen or if the condition fails to improve as anticipated.  I provided 14 minutes of non-face-to-face time during this encounter.  Carlos American. Dewaine Oats, [place] printed and mailed       This encounter was created in error - please disregard.

## 2022-08-24 NOTE — Progress Notes (Unsigned)
   This service is provided via telemedicine  No vital signs collected/recorded due to the encounter was a telemedicine visit.   Location of patient (ex: home, work):  Home  Patient consents to a telephone visit: Yes, see telephone visit dated 08/24/22  Location of the provider (ex: office, home):  [facility], Office   Name of any referring provider:  N/A  Names of all persons participating in the telemedicine service and their role in the encounter:  S.Chrae B/CMA, [name], NP, and Patient   Time spent on call:  11 min with medical assistant

## 2022-09-28 ENCOUNTER — Other Ambulatory Visit: Payer: Self-pay | Admitting: Nurse Practitioner

## 2022-09-28 ENCOUNTER — Other Ambulatory Visit: Payer: Self-pay | Admitting: Family

## 2022-09-28 DIAGNOSIS — G4700 Insomnia, unspecified: Secondary | ICD-10-CM

## 2022-09-28 NOTE — Telephone Encounter (Signed)
Ambien prescription last prescribed 08/11/2021 with 5 refills. Patient needs to update contract.  Medication pended and sent to Marlowe Sax, NP

## 2022-09-28 NOTE — Telephone Encounter (Signed)
Ambien and Famotidine prescription send to pharmacy.

## 2022-10-28 DIAGNOSIS — B351 Tinea unguium: Secondary | ICD-10-CM | POA: Diagnosis not present

## 2022-10-28 DIAGNOSIS — D225 Melanocytic nevi of trunk: Secondary | ICD-10-CM | POA: Diagnosis not present

## 2022-10-28 DIAGNOSIS — Z08 Encounter for follow-up examination after completed treatment for malignant neoplasm: Secondary | ICD-10-CM | POA: Diagnosis not present

## 2022-10-28 DIAGNOSIS — Z86006 Personal history of melanoma in-situ: Secondary | ICD-10-CM | POA: Diagnosis not present

## 2022-10-28 DIAGNOSIS — L821 Other seborrheic keratosis: Secondary | ICD-10-CM | POA: Diagnosis not present

## 2022-10-28 DIAGNOSIS — D485 Neoplasm of uncertain behavior of skin: Secondary | ICD-10-CM | POA: Diagnosis not present

## 2022-10-28 DIAGNOSIS — C4362 Malignant melanoma of left upper limb, including shoulder: Secondary | ICD-10-CM | POA: Diagnosis not present

## 2022-10-28 DIAGNOSIS — Z85828 Personal history of other malignant neoplasm of skin: Secondary | ICD-10-CM | POA: Diagnosis not present

## 2022-10-28 DIAGNOSIS — C44511 Basal cell carcinoma of skin of breast: Secondary | ICD-10-CM | POA: Diagnosis not present

## 2022-10-28 DIAGNOSIS — L814 Other melanin hyperpigmentation: Secondary | ICD-10-CM | POA: Diagnosis not present

## 2022-10-28 DIAGNOSIS — L570 Actinic keratosis: Secondary | ICD-10-CM | POA: Diagnosis not present

## 2022-12-03 ENCOUNTER — Ambulatory Visit: Payer: Medicare Other | Admitting: Nurse Practitioner

## 2022-12-17 DIAGNOSIS — C44511 Basal cell carcinoma of skin of breast: Secondary | ICD-10-CM | POA: Diagnosis not present

## 2022-12-21 ENCOUNTER — Other Ambulatory Visit: Payer: Self-pay | Admitting: Family

## 2022-12-22 NOTE — Telephone Encounter (Signed)
RX was approved on 09/29/23 #4 with no refills. Please advise if this is meant to be a long-term medication. If so I will send for a year supply.   Thanks

## 2022-12-24 ENCOUNTER — Ambulatory Visit: Payer: Medicare Other | Admitting: Nurse Practitioner

## 2023-01-06 ENCOUNTER — Telehealth: Payer: Self-pay

## 2023-01-06 ENCOUNTER — Ambulatory Visit (INDEPENDENT_AMBULATORY_CARE_PROVIDER_SITE_OTHER): Payer: Medicare Other | Admitting: Nurse Practitioner

## 2023-01-06 ENCOUNTER — Encounter: Payer: Self-pay | Admitting: Nurse Practitioner

## 2023-01-06 DIAGNOSIS — Z0000 Encounter for general adult medical examination without abnormal findings: Secondary | ICD-10-CM | POA: Diagnosis not present

## 2023-01-06 DIAGNOSIS — E2839 Other primary ovarian failure: Secondary | ICD-10-CM | POA: Diagnosis not present

## 2023-01-06 NOTE — Telephone Encounter (Signed)
Theresa Pena, Theresa Pena are scheduled for a virtual visit with your provider today.    Just as we do with appointments in the office, we must obtain your consent to participate.  Your consent will be active for this visit and any virtual visit you may have with one of our providers in the next 365 days.    If you have a MyChart account, I can also send a copy of this consent to you electronically.  All virtual visits are billed to your insurance company just like a traditional visit in the office.  As this is a virtual visit, video technology does not allow for your provider to perform a traditional examination.  This may limit your provider's ability to fully assess your condition.  If your provider identifies any concerns that need to be evaluated in person or the need to arrange testing such as labs, EKG, etc, we will make arrangements to do so.    Although advances in technology are sophisticated, we cannot ensure that it will always work on either your end or our end.  If the connection with a video visit is poor, we may have to switch to a telephone visit.  With either a video or telephone visit, we are not always able to ensure that we have a secure connection.   I need to obtain your verbal consent now.   Are you willing to proceed with your visit today?   Theresa Pena has provided verbal consent on 01/06/2023 for a virtual visit (video or telephone).   Leigh Aurora [place], [place] 01/06/2023  10:49 AM

## 2023-01-06 NOTE — Patient Instructions (Signed)
Theresa Pena , Thank you for taking time to come for your Medicare Wellness Visit. I appreciate your ongoing commitment to your health goals. Please review the following plan we discussed and let me know if I can assist you in the future.   Screening recommendations/referrals: Colonoscopy over due- please call to schedule Mammogram up to date- due in May Bone Density up to date- due in May Recommended yearly ophthalmology/optometry visit for glaucoma screening and checkup Recommended yearly dental visit for hygiene and checkup  Vaccinations: Influenza vaccine- due annually in September/October Pneumococcal vaccine up to date Tdap vaccine DUE- recommend to get at your local pharmacy Shingles vaccine up to date    Advanced directives: on file  Conditions/risks identified: advanced age.  Next appointment: yearly for AWV   Preventive Care 73 Years and Older, Female Preventive care refers to lifestyle choices and visits with your health care provider that can promote health and wellness. What does preventive care include? A yearly physical exam. This is also called an annual well check. Dental exams once or twice a year. Routine eye exams. Ask your health care provider how often you should have your eyes checked. Personal lifestyle choices, including: Daily care of your teeth and gums. Regular physical activity. Eating a healthy diet. Avoiding tobacco and drug use. Limiting alcohol use. Practicing safe sex. Taking low-dose aspirin every day. Taking vitamin and mineral supplements as recommended by your health care provider. What happens during an annual well check? The services and screenings done by your health care provider during your annual well check will depend on your age, overall health, lifestyle risk factors, and family history of disease. Counseling  Your health care provider may ask you questions about your: Alcohol use. Tobacco use. Drug use. Emotional  well-being. Home and relationship well-being. Sexual activity. Eating habits. History of falls. Memory and ability to understand (cognition). Work and work Statistician. Reproductive health. Screening  You may have the following tests or measurements: Height, weight, and BMI. Blood pressure. Lipid and cholesterol levels. These may be checked every 5 years, or more frequently if you are over 73 years old. Skin check. Lung cancer screening. You may have this screening every year starting at age 73 if you have a 30-pack-year history of smoking and currently smoke or have quit within the past 15 years. Fecal occult blood test (FOBT) of the stool. You may have this test every year starting at age 73. Flexible sigmoidoscopy or colonoscopy. You may have a sigmoidoscopy every 5 years or a colonoscopy every 10 years starting at age 73. Hepatitis C blood test. Hepatitis B blood test. Sexually transmitted disease (STD) testing. Diabetes screening. This is done by checking your blood sugar (glucose) after you have not eaten for a while (fasting). You may have this done every 1-3 years. Bone density scan. This is done to screen for osteoporosis. You may have this done starting at age 73. Mammogram. This may be done every 1-2 years. Talk to your health care provider about how often you should have regular mammograms. Talk with your health care provider about your test results, treatment options, and if necessary, the need for more tests. Vaccines  Your health care provider may recommend certain vaccines, such as: Influenza vaccine. This is recommended every year. Tetanus, diphtheria, and acellular pertussis (Tdap, Td) vaccine. You may need a Td booster every 10 years. Zoster vaccine. You may need this after age 45. Pneumococcal 13-valent conjugate (PCV13) vaccine. One dose is recommended after age 73. Pneumococcal  polysaccharide (PPSV23) vaccine. One dose is recommended after age 73. Talk to your  health care provider about which screenings and vaccines you need and how often you need them. This information is not intended to replace advice given to you by your health care provider. Make sure you discuss any questions you have with your health care provider. Document Released: 10/17/2015 Document Revised: 06/09/2016 Document Reviewed: 07/22/2015 Elsevier Interactive Patient Education  2017 [employer] Prevention in the Home Falls can cause injuries. They can happen to people of all ages. There are many things you can do to make your home safe and to help prevent falls. What can I do on the outside of my home? Regularly fix the edges of walkways and driveways and fix any cracks. Remove anything that might make you trip as you walk through a door, such as a raised step or threshold. Trim any bushes or trees on the path to your home. Use bright outdoor lighting. Clear any walking paths of anything that might make someone trip, such as rocks or tools. Regularly check to see if handrails are loose or broken. Make sure that both sides of any steps have handrails. Any raised decks and porches should have guardrails on the edges. Have any leaves, snow, or ice cleared regularly. Use sand or salt on walking paths during winter. Clean up any spills in your garage right away. This includes oil or grease spills. What can I do in the bathroom? Use night lights. Install grab bars by the toilet and in the tub and shower. Do not use towel bars as grab bars. Use non-skid mats or decals in the tub or shower. If you need to sit down in the shower, use a plastic, non-slip stool. Keep the floor dry. Clean up any water that spills on the floor as soon as it happens. Remove soap buildup in the tub or shower regularly. Attach bath mats securely with double-sided non-slip rug tape. Do not have throw rugs and other things on the floor that can make you trip. What can I do in the bedroom? Use night  lights. Make sure that you have a light by your bed that is easy to reach. Do not use any sheets or blankets that are too big for your bed. They should not hang down onto the floor. Have a firm chair that has side arms. You can use this for support while you get dressed. Do not have throw rugs and other things on the floor that can make you trip. What can I do in the kitchen? Clean up any spills right away. Avoid walking on wet floors. Keep items that you use a lot in easy-to-reach places. If you need to reach something above you, use a strong step stool that has a grab bar. Keep electrical cords out of the way. Do not use floor polish or wax that makes floors slippery. If you must use wax, use non-skid floor wax. Do not have throw rugs and other things on the floor that can make you trip. What can I do with my stairs? Do not leave any items on the stairs. Make sure that there are handrails on both sides of the stairs and use them. Fix handrails that are broken or loose. Make sure that handrails are as long as the stairways. Check any carpeting to make sure that it is firmly attached to the stairs. Fix any carpet that is loose or worn. Avoid having throw rugs at the top  or bottom of the stairs. If you do have throw rugs, attach them to the floor with carpet tape. Make sure that you have a light switch at the top of the stairs and the bottom of the stairs. If you do not have them, ask someone to add them for you. What else can I do to help prevent falls? Wear shoes that: Do not have high heels. Have rubber bottoms. Are comfortable and fit you well. Are closed at the toe. Do not wear sandals. If you use a stepladder: Make sure that it is fully opened. Do not climb a closed stepladder. Make sure that both sides of the stepladder are locked into place. Ask someone to hold it for you, if possible. Clearly mark and make sure that you can see: Any grab bars or handrails. First and last  steps. Where the edge of each step is. Use tools that help you move around (mobility aids) if they are needed. These include: Canes. Walkers. Scooters. Crutches. Turn on the lights when you go into a dark area. Replace any light bulbs as soon as they burn out. Set up your furniture so you have a clear path. Avoid moving your furniture around. If any of your floors are uneven, fix them. If there are any pets around you, be aware of where they are. Review your medicines with your doctor. Some medicines can make you feel dizzy. This can increase your chance of falling. Ask your doctor what other things that you can do to help prevent falls. This information is not intended to replace advice given to you by your health care provider. Make sure you discuss any questions you have with your health care provider. Document Released: 07/17/2009 Document Revised: 02/26/2016 Document Reviewed: 10/25/2014 Elsevier Interactive Patient Education  2017 [employer].

## 2023-01-06 NOTE — Progress Notes (Signed)
Subjective:   Theresa Pena is a 72 y.o. female who presents for Medicare Annual (Subsequent) preventive examination.  Review of Systems     Cardiac Risk Factors include: advanced age (>36men, >10 women);dyslipidemia     Objective:    There were no vitals filed for this visit. There is no height or weight on file to calculate BMI.     01/06/2023   10:43 AM 06/03/2022    2:55 PM 11/27/2021   10:12 AM 09/30/2021   10:41 AM 08/21/2021    9:11 AM 05/22/2021   10:34 AM 11/21/2020   10:59 AM  Advanced Directives  Does Patient Have a Medical Advance Directive? Yes Yes Yes Yes Yes Yes Yes  Type of Advance Directive Healthcare Power of Attorney Living will;Healthcare Power of [place] will Living will [place];Living will [place];Living will  Does patient want to make changes to medical advance directive? Yes (MAU/Ambulatory/Procedural Areas - Information given) No - Patient declined No - Patient declined No - Patient declined No - Patient declined No - Patient declined No - Patient declined  Copy of [place] in Chart? Yes - validated most recent copy scanned in chart (See row information) No - copy requested Yes - validated most recent copy scanned in chart (See row information)   No - copy requested No - copy requested    Current Medications (verified) Outpatient Encounter Medications as of 01/06/2023  Medication Sig   Cholecalciferol (VITAMIN D3 PO) Take 500 Units by mouth daily.   Cyanocobalamin (VITAMIN B 12 PO) Take 1 tablet by mouth.   famotidine (PEPCID) 20 MG tablet Take 20 mg by mouth at bedtime.   fish oil-omega-3 fatty acids 1000 MG capsule Take 1 g by mouth daily.    Misc Natural Products (GLUCOSAMINE CHONDROITIN ADV PO) Take 1 tablet by mouth 2 (two) times daily.   Multiple Vitamin (MULTIVITAMIN) tablet Take 1 tablet by mouth daily.   rosuvastatin (CRESTOR) 5 MG tablet TAKE 1  TABLET(5 MG) BY MOUTH DAILY   UNABLE TO FIND Med Name: Bone Up Calcium  supplement 333.3 mg , 1-2 times daily   venlafaxine (EFFEXOR) 75 MG tablet TAKE 1 TABLET(75 MG) BY MOUTH TWICE DAILY   zolpidem (AMBIEN) 5 MG tablet TAKE 1/2 TABLET(2.5 MG) BY MOUTH AT BEDTIME AS NEEDED FOR SLEEP   [DISCONTINUED] famotidine (PEPCID) 20 MG tablet TAKE 1/2 TABLET BY MOUTH EVERY MORNING AND 1 TABLET EVERY EVENING (Patient not taking: Reported on 01/06/2023)   No facility-administered encounter medications on file as of 01/06/2023.    Allergies (verified) Codeine and Omeprazole   History: Past Medical History:  Diagnosis Date   Cancer 2001   breast    Cataracts, bilateral    GERD (gastroesophageal reflux disease)    Hyperlipidemia    Past Surgical History:  Procedure Laterality Date   BREAST SURGERY     RADIAL KERATOTOMY     Family History  Problem Relation Age of Onset   Hyperlipidemia Mother    Alzheimer's disease Sister    Esophageal cancer Cousin 9   Colon cancer Neg Hx    Stomach cancer Neg Hx    Social History   Socioeconomic History   Marital status: Divorced    Spouse name: Not on file   Number of children: Not on file   Years of education: Not on file   Highest education level: Not on file  Occupational History   Not on  file  Tobacco Use   Smoking status: Former    Types: Cigarettes    Quit date: 10/04/1990    Years since quitting: 32.2   Smokeless tobacco: Never  Vaping Use   Vaping Use: Never used  Substance and Sexual Activity   Alcohol use: Yes    Alcohol/week: 8.0 standard drinks of alcohol    Types: 8 Glasses of wine per week   Drug use: Never   Sexual activity: Not on file  Other Topics Concern   Not on file  Social History Narrative   Diet:      Do you drink/ eat things with caffeine? Coffee 2-3 cups /day      Marital status:  Divorced                             What year were you married ? 2004      Do you live in a house, apartment,assistred living,  condo, trailer, etc.)? House      Is it one or more stories? 1      How many persons live in your home ? 1      Do you have any pets in your home ?(please list)  1 dog      Current or past profession: Residential cleaning      Do you exercise?                              Type & how often: Rarely      Do you have a living will? Yes      Do you have a DNR form?   No                    If not, do you want to discuss one? Yes      Do you have signed POA?HPOA forms?   Yes              If so, please bring to your        appointment      Social Determinants of Health   Financial Resource Strain: Low Risk  (09/23/2017)   Overall Financial Resource Strain (CARDIA)    Difficulty of Paying Living Expenses: Not hard at all  Food Insecurity: No Food Insecurity (09/23/2017)   Hunger Vital Sign    Worried About Running Out of Food in the Last Year: Never true    Ran Out of Food in the Last Year: Never true  Transportation Needs: No Transportation Needs (09/23/2017)   PRAPARE - Hydrologist (Medical): No    Lack of Transportation (Non-Medical): No  Physical Activity: Sufficiently Active (09/23/2017)   Exercise Vital Sign    Days of Exercise per Week: 4 days    Minutes of Exercise per Session: 60 min  Stress: No Stress Concern Present (09/23/2017)   [place]    Feeling of Stress : Only a little  Social Connections: Moderately Isolated (09/23/2017)   Social Connection and Isolation Panel [NHANES]    Frequency of Communication with Friends and Family: Twice a week    Frequency of Social Gatherings with Friends and Family: Once a week    Attends Religious Services: Never    Marine scientist or Organizations: No    Attends Archivist Meetings: Never  Marital Status: Divorced    Tobacco Counseling Counseling given: Not Answered   Clinical Intake:  Pre-visit preparation  completed: Yes  Pain : No/denies pain     BMI - recorded: 27.62 Nutritional Status: BMI 25 -29 Overweight Diabetes: No  How often do you need to have someone help you when you read instructions, pamphlets, or other written materials from your doctor or pharmacy?: 1 - Never  Diabetic?no         Activities of Daily Living    01/06/2023   10:57 AM  In your present state of health, do you have any difficulty performing the following activities:  Hearing? 1  Vision? 0  Difficulty concentrating or making decisions? 0  Walking or climbing stairs? 0  Dressing or bathing? 0  Doing errands, shopping? 0  Preparing Food and eating ? N  Using the Toilet? N  In the past six months, have you accidently leaked urine? N  Do you have problems with loss of bowel control? N  Managing your Medications? N  Managing your Finances? N  Housekeeping or managing your Housekeeping? N    Patient Care Team: Lauree Chandler, NP as PCP - General (Geriatric Medicine) Webb Laws, [place] as Referring Physician (Optometry) Druscilla Brownie, MD as Consulting Physician (Dermatology)  Indicate any recent Medical Services you may have received from other than [facility] providers in the past year (date may be approximate).     Assessment:   This is a routine wellness examination for [place].  Hearing/Vision screen Hearing Screening - Comments:: Patient with decreased hearing, doesn't feel the need for hearing aids  Vision Screening - Comments:: Last eye exam less than 12 months ago, last week with Dr.Mcfarland in [place] issues and exercise activities discussed: Current Exercise Habits: Home exercise routine, Type of exercise: walking, Time (Minutes): 60, Frequency (Times/Week): 3, Weekly Exercise (Minutes/Week): 180   Goals Addressed   None    Depression Screen    01/06/2023   10:40 AM 06/04/2022   10:35 AM 11/27/2021   10:28 AM 08/21/2021    9:06 AM 05/22/2021   10:34 AM  05/16/2020   11:05 AM 10/19/2019   10:01 AM  PHQ 2/9 Scores  PHQ - 2 Score 0 0 0 0 0 0 0    Fall Risk    01/06/2023   10:40 AM 06/04/2022   10:35 AM 11/27/2021   10:27 AM 09/30/2021   10:41 AM 08/21/2021    9:09 AM  Fall Risk   Falls in the past year? 0 0 0 0 0  Number falls in past yr: 0 0 0 0 0  Injury with Fall? 0 0 0 0 0  Risk for fall due to : No Fall Risks No Fall Risks No Fall Risks No Fall Risks No Fall Risks  Follow up Falls evaluation completed Falls evaluation completed Falls evaluation completed Falls evaluation completed Falls evaluation completed    [place]:  Any stairs in or around the home? Yes  If so, are there any without handrails? No  Home free of loose throw rugs in walkways, pet beds, electrical cords, etc? Yes  Adequate lighting in your home to reduce risk of falls? Yes   ASSISTIVE DEVICES UTILIZED TO PREVENT FALLS:  Life alert? No  Use of a cane, walker or w/c? No  Grab bars in the bathroom? Yes  Shower chair or bench in shower? No  Elevated toilet seat or a handicapped toilet?  No   TIMED UP AND GO:  Was the test performed? No .    Cognitive Function:    09/29/2018   10:10 AM 09/23/2017    9:41 AM 06/18/2016    3:03 PM  MMSE - Mini Mental State Exam  Orientation to time 5 5 5   Orientation to Place 5 5 5   Registration 3 3 3   Attention/ Calculation 5 5 5   Recall 3 2 2   Language- name 2 objects 2 2 2   Language- repeat 1 1 1   Language- follow 3 step command 3 3 3   Language- read & follow direction 1 1 1   Write a sentence 1 1 1   Copy design 1 1 1   Total score 30 29 29         01/06/2023   10:44 AM 08/21/2021    9:11 AM 10/19/2019   10:02 AM  6CIT Screen  What Year? 0 points 0 points 0 points  What month? 0 points 0 points 0 points  What time? 0 points 0 points 0 points  Count back from 20 0 points 0 points 0 points  Months in reverse 0 points 0 points 0 points  Repeat phrase 2 points 0 points 0 points   Total Score 2 points 0 points 0 points    Immunizations Immunization History  Administered Date(s) Administered   Influenza, High Dose Seasonal PF 08/18/2018   Influenza, Quadrivalent, Recombinant, Inj, Pf 08/13/2021   Influenza,inj,Quad PF,6+ Mos 06/18/2016, 09/23/2017   Influenza-Unspecified 09/01/2018, 08/10/2021   Moderna SARS-COV2 Booster Vaccination 08/01/2020   Moderna Sars-Covid-2 Vaccination 11/09/2019, 12/12/2019   Pneumococcal Conjugate-13 06/18/2016   Pneumococcal Polysaccharide-23 09/23/2017   Unspecified SARS-COV-2 Vaccination 11/09/2019, 12/12/2019, 08/01/2020   Zoster Recombinat (Shingrix) 08/13/2021, 11/04/2021   Zoster, Live 06/05/2015    TDAP status: Due, Education has been provided regarding the importance of this vaccine. Advised may receive this vaccine at local pharmacy or Health Dept. Aware to provide a copy of the vaccination record if obtained from local pharmacy or Health Dept. Verbalized acceptance and understanding.  Flu Vaccine status: Up to date  Pneumococcal vaccine status: Up to date  Covid-19 vaccine status: Information provided on how to obtain vaccines.   Qualifies for Shingles Vaccine? Yes   Zostavax completed No   Shingrix Completed?: Yes  Screening Tests Health Maintenance  Topic Date Due   DTaP/Tdap/Td (1 - Tdap) Never done   COLONOSCOPY (Pts 45-18yrs Insurance coverage will need to be confirmed)  01/28/2022   INFLUENZA VACCINE  05/05/2023   Medicare Annual Wellness (AWV)  01/06/2024   MAMMOGRAM  02/26/2024   Pneumonia Vaccine 98+ Years old  Completed   DEXA SCAN  Completed   Hepatitis C Screening  Completed   Zoster Vaccines- Shingrix  Completed   HPV VACCINES  Aged Out   COVID-19 Vaccine  Discontinued    Health Maintenance  Health Maintenance Due  Topic Date Due   DTaP/Tdap/Td (1 - Tdap) Never done   COLONOSCOPY (Pts 45-23yrs Insurance coverage will need to be confirmed)  01/28/2022    Colorectal cancer screening: Type  of screening: Colonoscopy. Completed 2018. Repeat every 5 years  Mammogram status: Completed 02/25/22. Repeat every year  Bone Density status: Ordered today . Pt provided with contact info and advised to call to schedule appt.  Lung Cancer Screening: (Low Dose CT Chest recommended if Age 53-80 years, 30 pack-year currently smoking OR have quit w/in 15years.) does not qualify.   Lung Cancer Screening Referral: na  Additional Screening:  Hepatitis  C Screening: does qualify; Completed   Vision Screening: Recommended annual ophthalmology exams for early detection of glaucoma and other disorders of the eye. Is the patient up to date with their annual eye exam?  Yes  Who is the provider or what is the name of the office in which the patient attends annual eye exams? [facility]  If pt is not established with a provider, would they like to be referred to a provider to establish care? No .   Dental Screening: Recommended annual dental exams for proper oral hygiene  Community Resource Referral / Chronic Care Management: CRR required this visit?  No   CCM required this visit?  No      Plan:     I have personally reviewed and noted the following in the patient's chart:   Medical and social history Use of alcohol, tobacco or illicit drugs  Current medications and supplements including opioid prescriptions. Patient is not currently taking opioid prescriptions. Functional ability and status Nutritional status Physical activity Advanced directives List of other physicians Hospitalizations, surgeries, and ER visits in previous 12 months Vitals Screenings to include cognitive, depression, and falls Referrals and appointments  In addition, I have reviewed and discussed with patient certain preventive protocols, quality metrics, and best practice recommendations. A written personalized care plan for preventive services as well as general preventive health recommendations were provided to  patient.     Lauree Chandler, NP   01/06/2023   Virtual Visit via Video Note  I connected with Raechel Chute on 01/06/23 at 10:40 AM EDT by a video enabled telemedicine application and verified that I am speaking with the correct person using two identifiers.  Location: Patient: home Provider: [facility]    I discussed the limitations of evaluation and management by telemedicine and the availability of in person appointments. The patient expressed understanding and agreed to proceed.    I discussed the assessment and treatment plan with the patient. The patient was provided an opportunity to ask questions and all were answered. The patient agreed with the plan and demonstrated an understanding of the instructions.   The patient was advised to call back or seek an in-person evaluation if the symptoms worsen or if the condition fails to improve as anticipated.  I provided 15 minutes of non-face-to-face time during this encounter.  Carlos American. Dewaine Oats, AGNP Avs printed and mailed.

## 2023-01-06 NOTE — Progress Notes (Signed)
   This service is provided via telemedicine  No vital signs collected/recorded due to the encounter was a telemedicine visit.   Location of patient (ex: home, work):  Home  Patient consents to a telephone visit: Yes, see telephone visit dated 01/06/23  Location of the provider (ex: office, home): [place], [place] Location   Name of any referring provider:  N/A  Names of all persons participating in the telemedicine service and their role in the encounter:  S.Chrae B/CMA, [name], NP, and Patient   Time spent on call:  9 min with medical assistant

## 2023-01-07 ENCOUNTER — Ambulatory Visit (INDEPENDENT_AMBULATORY_CARE_PROVIDER_SITE_OTHER): Payer: Medicare Other | Admitting: Nurse Practitioner

## 2023-01-07 ENCOUNTER — Ambulatory Visit: Payer: Medicare Other | Admitting: Nurse Practitioner

## 2023-01-07 ENCOUNTER — Encounter: Payer: Self-pay | Admitting: Nurse Practitioner

## 2023-01-07 VITALS — BP 120/78 | HR 84 | Temp 97.9°F | Resp 16 | Ht 68.0 in | Wt 183.2 lb

## 2023-01-07 DIAGNOSIS — K219 Gastro-esophageal reflux disease without esophagitis: Secondary | ICD-10-CM | POA: Diagnosis not present

## 2023-01-07 DIAGNOSIS — G4700 Insomnia, unspecified: Secondary | ICD-10-CM

## 2023-01-07 DIAGNOSIS — F39 Unspecified mood [affective] disorder: Secondary | ICD-10-CM

## 2023-01-07 DIAGNOSIS — M8589 Other specified disorders of bone density and structure, multiple sites: Secondary | ICD-10-CM

## 2023-01-07 DIAGNOSIS — E782 Mixed hyperlipidemia: Secondary | ICD-10-CM | POA: Diagnosis not present

## 2023-01-07 MED ORDER — PANTOPRAZOLE SODIUM 40 MG PO TBEC: 40.0000 mg | DELAYED_RELEASE_TABLET | Freq: Every day | ORAL | 3 refills | Status: AC

## 2023-01-07 NOTE — Progress Notes (Unsigned)
Careteam: Patient Care Team: Sharon Seller, NP as PCP - General (Geriatric Medicine) Glenford Peers, OD as Referring Physician (Optometry) Cherlyn Roberts, MD as Consulting Physician (Dermatology)  PLACE OF SERVICE:  [facility]  Advanced Directive information    Allergies  Allergen Reactions   Codeine Nausea Only    Also has dizziness   Omeprazole Other (See Comments)    GI upset     Chief Complaint  Patient presents with   Medical Management of Chronic Issues     HPI: Patient is a 72 y.o. female routine follow up.   GERD- using pepcid at night. She uses tums ~3 times weekly.  Reports food hurts when she eats.   Hyperlipidemia- working on diet and taking crestor 5 mg daily- tolerating well.   Insomnia- sleeps well on ambien she was having a hard time sleeping through the night and Remus Loffler helps with this.   Mood stable on effexor.    Review of Systems:  Review of Systems  Constitutional:  Negative for chills, fever and weight loss.  HENT:  Negative for tinnitus.   Respiratory:  Negative for cough, sputum production and shortness of breath.   Cardiovascular:  Negative for chest pain, palpitations and leg swelling.  Gastrointestinal:  Positive for heartburn. Negative for abdominal pain, constipation and diarrhea.  Genitourinary:  Negative for dysuria, frequency and urgency.  Musculoskeletal:  Negative for back pain, falls, joint pain and myalgias.  Skin: Negative.   Neurological:  Negative for dizziness and headaches.  Psychiatric/Behavioral:  Negative for depression and memory loss. The patient does not have insomnia.     Past Medical History:  Diagnosis Date   Cancer 2001   breast    Cataracts, bilateral    GERD (gastroesophageal reflux disease)    Hyperlipidemia    Past Surgical History:  Procedure Laterality Date   BREAST SURGERY     RADIAL KERATOTOMY     Social History:   reports that she quit smoking about 32 years ago. Her smoking  use included cigarettes. She has never used smokeless tobacco. She reports current alcohol use of about 8.0 standard drinks of alcohol per week. She reports that she does not use drugs.  Family History  Problem Relation Age of Onset   Hyperlipidemia Mother    Alzheimer's disease Sister    Esophageal cancer Cousin 61   Colon cancer Neg Hx    Stomach cancer Neg Hx     Medications: Patient's Medications  New Prescriptions   No medications on file  Previous Medications   CHOLECALCIFEROL (VITAMIN D3 PO)    Take 500 Units by mouth daily.   CYANOCOBALAMIN (VITAMIN B 12 PO)    Take 1 tablet by mouth.   FAMOTIDINE (PEPCID) 20 MG TABLET    Take 20 mg by mouth at bedtime.   FISH OIL-OMEGA-3 FATTY ACIDS 1000 MG CAPSULE    Take 1 g by mouth daily.    MISC NATURAL PRODUCTS (GLUCOSAMINE CHONDROITIN ADV PO)    Take 1 tablet by mouth 2 (two) times daily.   MULTIPLE VITAMIN (MULTIVITAMIN) TABLET    Take 1 tablet by mouth daily.   ROSUVASTATIN (CRESTOR) 5 MG TABLET    TAKE 1 TABLET(5 MG) BY MOUTH DAILY   UNABLE TO FIND    Med Name: Bone Up Calcium  supplement 333.3 mg , 1-2 times daily   VENLAFAXINE (EFFEXOR) 75 MG TABLET    TAKE 1 TABLET(75 MG) BY MOUTH TWICE DAILY   ZOLPIDEM (AMBIEN) 5  MG TABLET    TAKE 1/2 TABLET(2.5 MG) BY MOUTH AT BEDTIME AS NEEDED FOR SLEEP  Modified Medications   No medications on file  Discontinued Medications   No medications on file    Physical Exam:  Vitals:   01/07/23 1359  BP: 120/78  Pulse: 84  Resp: 16  Temp: 97.9 F (36.6 C)  TempSrc: Temporal  SpO2: 95%  Weight: 183 lb 3.2 oz (83.1 kg)  Height:  (1.727 m)   Body mass index is 27.86 kg/m. Wt Readings from Last 3 Encounters:  01/07/23 183 lb 3.2 oz (83.1 kg)  06/04/22 181 lb (82.1 kg)  11/27/21 182 lb 9.6 oz (82.8 kg)    Physical Exam Constitutional:      General: She is not in acute distress.    Appearance: She is well-developed. She is not diaphoretic.  HENT:     Head: Normocephalic and  atraumatic.     Mouth/Throat:     Pharynx: No oropharyngeal exudate.  Eyes:     Conjunctiva/sclera: Conjunctivae normal.     Pupils: Pupils are equal, round, and reactive to light.  Cardiovascular:     Rate and Rhythm: Normal rate and regular rhythm.     Heart sounds: Normal heart sounds.  Pulmonary:     Effort: Pulmonary effort is normal.     Breath sounds: Normal breath sounds.  Abdominal:     General: Bowel sounds are normal.     Palpations: Abdomen is soft.  Musculoskeletal:     Cervical back: Normal range of motion and neck supple.     Right lower leg: No edema.     Left lower leg: No edema.  Skin:    General: Skin is warm and dry.  Neurological:     Mental Status: She is alert.  Psychiatric:        Mood and Affect: Mood normal.     Labs reviewed: Basic Metabolic Panel: Recent Labs    06/04/22 1119  NA 137  K 4.8  CL 104  CO2 26  GLUCOSE 92  BUN 14  CREATININE 0.76  CALCIUM 9.3   Liver Function Tests: Recent Labs    06/04/22 1119  AST 17  ALT 16  BILITOT 0.4  PROT 6.4   No results for input(s): "LIPASE", "AMYLASE" in the last 8760 hours. No results for input(s): "AMMONIA" in the last 8760 hours. CBC: Recent Labs    06/04/22 1119  WBC 4.8  NEUTROABS 2,314  HGB 13.2  HCT 39.3  MCV 93.6  PLT 240   Lipid Panel: Recent Labs    06/04/22 1119  CHOL 201*  HDL 73  LDLCALC 110*  TRIG 89  CHOLHDL 2.8   TSH: No results for input(s): "TSH" in the last 8760 hours. A1C: No results found for: "HGBA1C"   Assessment/Plan 1. Insomnia, unspecified type Controlled, uses ambien PRN  2. Mixed hyperlipidemia Due for lipids today -continues on crestor with dietary modifications, tolerating medication well.  - Lipid panel - CBC with Differential/Platelet  3. Gastroesophageal reflux disease without esophagitis -having more symptoms of GERD. Not controlled with OTC Will start protonix at this time - pantoprazole (PROTONIX) 40 MG tablet; Take 1  tablet (40 mg total) by mouth daily.  Dispense: 30 tablet; Refill: 3 - CBC with Differential/Platelet - Complete Metabolic Panel with eGFR  4. Mood disorder Controlled on effexor.   5. Osteopenia of multiple sites -Recommended to take calcium 600 mg twice daily with Vitamin D 2000 units daily and  weight bearing activity 30 mins/5 days a week - Complete Metabolic Panel with eGFR   Return in about 6 months (around 07/09/2023) for routine follow up.  Janene HarveyJessica K. Biagio BorgEubanks, AGNP [facility] [phone]

## 2023-01-07 NOTE — Patient Instructions (Addendum)
To start protonix 40 mg by mouth daily  This may take up to 4 weeks to work- to let us know if you have side effects.

## 2023-01-08 LAB — CBC WITH DIFFERENTIAL/PLATELET
Absolute Monocytes: 770 cells/uL (ref 200–950)
Basophils Absolute: 50 cells/uL (ref 0–200)
Basophils Relative: 0.7 %
Eosinophils Absolute: 101 cells/uL (ref 15–500)
Eosinophils Relative: 1.4 %
HCT: 38.7 % (ref 35.0–45.0)
Hemoglobin: 13.1 g/dL (ref 11.7–15.5)
Lymphs Abs: 2556 cells/uL (ref 850–3900)
MCH: 31 pg (ref 27.0–33.0)
MCHC: 33.9 g/dL (ref 32.0–36.0)
MCV: 91.5 fL (ref 80.0–100.0)
MPV: 12.5 fL (ref 7.5–12.5)
Monocytes Relative: 10.7 %
Neutro Abs: 3722 cells/uL (ref 1500–7800)
Neutrophils Relative %: 51.7 %
Platelets: 241 10*3/uL (ref 140–400)
RBC: 4.23 10*6/uL (ref 3.80–5.10)
RDW: 13.1 % (ref 11.0–15.0)
Total Lymphocyte: 35.5 %
WBC: 7.2 10*3/uL (ref 3.8–10.8)

## 2023-01-08 LAB — COMPLETE METABOLIC PANEL WITH GFR
AG Ratio: 1.7 (calc) (ref 1.0–2.5)
ALT: 16 U/L (ref 6–29)
AST: 16 U/L (ref 10–35)
Albumin: 4.3 g/dL (ref 3.6–5.1)
Alkaline phosphatase (APISO): 49 U/L (ref 37–153)
BUN: 15 mg/dL (ref 7–25)
CO2: 28 mmol/L (ref 20–32)
Calcium: 9.4 mg/dL (ref 8.6–10.4)
Chloride: 103 mmol/L (ref 98–110)
Creat: 0.7 mg/dL (ref 0.60–1.00)
Globulin: 2.5 g/dL (calc) (ref 1.9–3.7)
Glucose, Bld: 88 mg/dL (ref 65–139)
Potassium: 4.5 mmol/L (ref 3.5–5.3)
Sodium: 138 mmol/L (ref 135–146)
Total Bilirubin: 0.3 mg/dL (ref 0.2–1.2)
Total Protein: 6.8 g/dL (ref 6.1–8.1)
eGFR: 92 mL/min/{1.73_m2} (ref >=60)

## 2023-01-08 LAB — LIPID PANEL
Cholesterol: 214 mg/dL — ABNORMAL HIGH (ref <200)
HDL: 81 mg/dL (ref >=50)
LDL Cholesterol (Calc): 107 mg/dL (calc) — ABNORMAL HIGH
Non-HDL Cholesterol (Calc): 133 mg/dL (calc) — ABNORMAL HIGH (ref <130)
Total CHOL/HDL Ratio: 2.6 (calc) (ref <5.0)
Triglycerides: 149 mg/dL (ref <150)

## 2023-02-14 ENCOUNTER — Other Ambulatory Visit: Payer: Self-pay | Admitting: Nurse Practitioner

## 2023-02-14 DIAGNOSIS — E782 Mixed hyperlipidemia: Secondary | ICD-10-CM

## 2023-02-14 DIAGNOSIS — F39 Unspecified mood [affective] disorder: Secondary | ICD-10-CM

## 2023-03-03 DIAGNOSIS — Z1231 Encounter for screening mammogram for malignant neoplasm of breast: Secondary | ICD-10-CM | POA: Diagnosis not present

## 2023-03-03 LAB — HM MAMMOGRAPHY

## 2023-03-18 ENCOUNTER — Ambulatory Visit: Payer: Medicare Other | Admitting: Orthopedic Surgery

## 2023-04-04 LAB — HM DEXA SCAN

## 2023-04-14 DIAGNOSIS — Z853 Personal history of malignant neoplasm of breast: Secondary | ICD-10-CM | POA: Diagnosis not present

## 2023-04-14 DIAGNOSIS — Z8262 Family history of osteoporosis: Secondary | ICD-10-CM | POA: Diagnosis not present

## 2023-04-14 DIAGNOSIS — R29890 Loss of height: Secondary | ICD-10-CM | POA: Diagnosis not present

## 2023-04-15 ENCOUNTER — Telehealth: Payer: Medicare Other | Admitting: Nurse Practitioner

## 2023-04-15 ENCOUNTER — Ambulatory Visit: Payer: Medicare Other | Admitting: Orthopedic Surgery

## 2023-04-15 ENCOUNTER — Other Ambulatory Visit: Payer: Self-pay

## 2023-04-15 ENCOUNTER — Encounter: Payer: Self-pay | Admitting: Orthopedic Surgery

## 2023-04-15 DIAGNOSIS — M19012 Primary osteoarthritis, left shoulder: Secondary | ICD-10-CM | POA: Diagnosis not present

## 2023-04-15 MED ORDER — LIDOCAINE HCL 1 % IJ SOLN
5.0000 mL | INTRAMUSCULAR | Status: AC | PRN
Start: 2023-04-15 — End: 2023-04-15
  Administered 2023-04-15: 5 mL

## 2023-04-15 MED ORDER — BUPIVACAINE HCL 0.5 % IJ SOLN
9.0000 mL | INTRAMUSCULAR | Status: AC | PRN
Start: 2023-04-15 — End: 2023-04-15
  Administered 2023-04-15: 9 mL via INTRA_ARTICULAR

## 2023-04-15 MED ORDER — CALCIUM CARBONATE 600 MG PO TABS: 600.0000 mg | ORAL_TABLET | Freq: Two times a day (BID) | ORAL | Status: DC

## 2023-04-15 MED ORDER — METHYLPREDNISOLONE ACETATE 40 MG/ML IJ SUSP
40.0000 mg | INTRAMUSCULAR | Status: AC | PRN
Start: 2023-04-15 — End: 2023-04-15
  Administered 2023-04-15: 40 mg via INTRA_ARTICULAR

## 2023-04-15 NOTE — Telephone Encounter (Signed)
Discussed results with patient, patient verbalized understanding of results. Patient states she is already taking calcium and vit D.

## 2023-04-15 NOTE — Telephone Encounter (Signed)
-----   Message from [place] sent at 04/15/2023  2:22 PM EDT ----- [facility] bone density results

## 2023-04-15 NOTE — Telephone Encounter (Signed)
Bone density results are normal at this time. Recommended to take calcium 600 mg twice daily with Vitamin D 2000 units daily and weight bearing activity 30 mins/5 days a week

## 2023-04-15 NOTE — Addendum Note (Signed)
Addended by: Maurice Small on: 04/15/2023 03:41 PM   Modules accepted: Orders

## 2023-04-15 NOTE — Progress Notes (Signed)
Office Visit Note   Patient: Theresa Pena           Date of Birth: November 26, 1950           MRN: [id number] Visit Date: 04/15/2023 Requested by: Sharon Seller, NP [address] [place]. [place],  [place] [address] PCP: Sharon Seller, NP  Subjective: Chief Complaint  Patient presents with   Left Shoulder - Pain    HPI: Theresa Pena is a 72 y.o. female who presents to the office reporting left shoulder pain.  Has had an injection in the past which has helped.  Has known history of arthritis in that left shoulder.  Overall has good function with the right shoulder.  Does not report much in terms of night pain or rest pain.  Pain does not wake her from sleep.  Takes Aleve and Tylenol for symptoms..                ROS: All systems reviewed are negative as they relate to the chief complaint within the history of present illness.  Patient denies fevers or chills.  Assessment & Plan: Visit Diagnoses:  1. Primary osteoarthritis, left shoulder     Plan: Impression is left shoulder pain with arthritis.  Some loss of motion which has progressed only marginally from prior visit.  Plan is left shoulder ultrasound-guided glenohumeral joint injection today which is tolerated well without difficulty.  She will follow-up as needed.  Follow-Up Instructions: No follow-ups on file.   Orders:  Orders Placed This Encounter  Procedures   US Guided Needle Placement - No Linked Charges   No orders of the defined types were placed in this encounter.     Procedures: Large Joint Inj: L glenohumeral on 04/15/2023 12:49 PM Indications: diagnostic evaluation and pain Details: 18 G 1.5 in needle, posterior approach  Arthrogram: No  Medications: 9 mL bupivacaine 0.5 %; 40 mg methylPREDNISolone acetate 40 MG/ML; 5 mL lidocaine 1 % Outcome: tolerated well, no immediate complications Procedure, treatment alternatives, risks and benefits explained, specific risks discussed. Consent was given by the  patient. Immediately prior to procedure a time out was called to verify the correct patient, procedure, equipment, support staff and site/side marked as required. Patient was prepped and draped in the usual sterile fashion.       Clinical Data: No additional findings.  Objective: Vital Signs: There were no vitals taken for this visit.  Physical Exam:  Constitutional: Patient appears well-developed HEENT:  Head: Normocephalic Eyes:EOM are normal Neck: Normal range of motion Cardiovascular: Normal rate Pulmonary/chest: Effort normal Neurologic: Patient is alert Skin: Skin is warm Psychiatric: Patient has normal mood and affect  Ortho Exam: Ortho exam demonstrates functional deltoid.  Passive range of motion on the left is 10/80/100.  Rotator cuff strength is reasonable.  Coarse grinding is present with internal and external rotation of the shoulder.  Specialty Comments:  No specialty comments available.  Imaging: US Guided Needle Placement - No Linked Charges  Result Date: 04/15/2023 Ultrasound imaging demonstrates needle placement into the left shoulder joint with extravasation of fluid and no complicating features    PMFS History: Patient Active Problem List   Diagnosis Date Noted   Mixed hyperlipidemia 09/23/2017   High risk medication use 09/23/2017   Insomnia 09/23/2017   History of breast cancer in female 03/14/2015   Mood disorder (HCC) 02/24/2012   Past Medical History:  Diagnosis Date   Cancer (HCC) 2001   breast    Cataracts, bilateral  GERD (gastroesophageal reflux disease)    Hyperlipidemia     Family History  Problem Relation Age of Onset   Hyperlipidemia Mother    Alzheimer's disease Sister    Esophageal cancer Cousin 60   Colon cancer Neg Hx    Stomach cancer Neg Hx     Past Surgical History:  Procedure Laterality Date   BREAST SURGERY     RADIAL KERATOTOMY     Social History   Occupational History   Not on file  Tobacco Use    Smoking status: Former    Current packs/day: 0.00    Types: Cigarettes    Quit date: 10/04/1990    Years since quitting: 32.5   Smokeless tobacco: Never  Vaping Use   Vaping status: Never Used  Substance and Sexual Activity   Alcohol use: Yes    Alcohol/week: 8.0 standard drinks of alcohol    Types: 8 Glasses of wine per week   Drug use: Never   Sexual activity: Not on file

## 2023-04-28 DIAGNOSIS — D2261 Melanocytic nevi of right upper limb, including shoulder: Secondary | ICD-10-CM | POA: Diagnosis not present

## 2023-04-28 DIAGNOSIS — Z08 Encounter for follow-up examination after completed treatment for malignant neoplasm: Secondary | ICD-10-CM | POA: Diagnosis not present

## 2023-04-28 DIAGNOSIS — L814 Other melanin hyperpigmentation: Secondary | ICD-10-CM | POA: Diagnosis not present

## 2023-04-28 DIAGNOSIS — D2262 Melanocytic nevi of left upper limb, including shoulder: Secondary | ICD-10-CM | POA: Diagnosis not present

## 2023-04-28 DIAGNOSIS — L570 Actinic keratosis: Secondary | ICD-10-CM | POA: Diagnosis not present

## 2023-04-28 DIAGNOSIS — L821 Other seborrheic keratosis: Secondary | ICD-10-CM | POA: Diagnosis not present

## 2023-04-28 DIAGNOSIS — D225 Melanocytic nevi of trunk: Secondary | ICD-10-CM | POA: Diagnosis not present

## 2023-04-28 DIAGNOSIS — D485 Neoplasm of uncertain behavior of skin: Secondary | ICD-10-CM | POA: Diagnosis not present

## 2023-04-28 DIAGNOSIS — Z85828 Personal history of other malignant neoplasm of skin: Secondary | ICD-10-CM | POA: Diagnosis not present

## 2023-05-22 ENCOUNTER — Other Ambulatory Visit: Payer: Self-pay | Admitting: Family

## 2023-05-22 DIAGNOSIS — G4700 Insomnia, unspecified: Secondary | ICD-10-CM

## 2023-05-23 NOTE — Telephone Encounter (Signed)
Patient has request refill on medication Ambien 5mg . Patient medication last refilled 09/28/2022. Patient has Non Opioid Contract dated 11/28/2020. Patient has upcoming appointment 07/15/2023. Update Contract added to patient appointment notes. Medication pend and sent to PCP  Janyth Contes Janene Harvey, NP

## 2023-05-27 ENCOUNTER — Telehealth: Payer: Medicare Other

## 2023-05-27 DIAGNOSIS — Z1211 Encounter for screening for malignant neoplasm of colon: Secondary | ICD-10-CM

## 2023-05-27 NOTE — Telephone Encounter (Signed)
Patient called stating that she was supposed to schedule her colonoscopy,but the previous provider has closed and needs to be referred somewhere else  Message sent to Abbey Chatters, NP

## 2023-05-30 NOTE — Telephone Encounter (Signed)
New referral placed.

## 2023-07-15 ENCOUNTER — Encounter: Payer: Self-pay | Admitting: Gastroenterology

## 2023-07-15 ENCOUNTER — Encounter: Payer: Self-pay | Admitting: Nurse Practitioner

## 2023-07-15 ENCOUNTER — Ambulatory Visit (INDEPENDENT_AMBULATORY_CARE_PROVIDER_SITE_OTHER): Payer: Medicare Other | Admitting: Nurse Practitioner

## 2023-07-15 VITALS — BP 128/84 | HR 70 | Temp 98.6°F | Ht 68.0 in | Wt 177.6 lb

## 2023-07-15 DIAGNOSIS — E782 Mixed hyperlipidemia: Secondary | ICD-10-CM | POA: Diagnosis not present

## 2023-07-15 DIAGNOSIS — G4700 Insomnia, unspecified: Secondary | ICD-10-CM

## 2023-07-15 DIAGNOSIS — F419 Anxiety disorder, unspecified: Secondary | ICD-10-CM | POA: Diagnosis not present

## 2023-07-15 DIAGNOSIS — K219 Gastro-esophageal reflux disease without esophagitis: Secondary | ICD-10-CM | POA: Diagnosis not present

## 2023-07-15 DIAGNOSIS — M19012 Primary osteoarthritis, left shoulder: Secondary | ICD-10-CM

## 2023-07-15 DIAGNOSIS — M8589 Other specified disorders of bone density and structure, multiple sites: Secondary | ICD-10-CM | POA: Diagnosis not present

## 2023-07-15 MED ORDER — LORAZEPAM 0.5 MG PO TABS
0.5000 mg | ORAL_TABLET | Freq: Every day | ORAL | 0 refills | Status: AC | PRN
Start: 2023-07-15 — End: ?

## 2023-07-15 NOTE — Progress Notes (Signed)
Careteam: Patient Care Team: Sharon Seller, NP as PCP - General (Geriatric Medicine) Glenford Peers, OD as Referring Physician (Optometry) Cherlyn Roberts, MD as Consulting Physician (Dermatology)  PLACE OF SERVICE:  [facility]  Advanced Directive information    Allergies  Allergen Reactions   Codeine Nausea Only    Also has dizziness   Omeprazole Other (See Comments)    GI upset     Chief Complaint  Patient presents with   Medical Management of Chronic Issues    Patient presents today for a 6 month follow-up   Quality Metric Gaps    TDAP,Flu, colonoscopy      HPI: Patient is a 72 y.o. female for routine follow up.   She is trying to help take care of her sister who has significant memory loss, who does not want help and makes it hard on her.  Mood has been stable. She had prescription for lorazapem that lasted her from 2016 but will take half tablet if she is having a lot of anxiety   Continues to have GERD- did not want to take protonix due to her GI unset with omeprazole. Has appt set up with GI for endoscopy and colonoscopy.  Wont be done until January.  Using tums and pepcid which does help.   Will get flu shot at a later date.   Had been following with orthopedic due to pain to left shoulder. Gets injections when pain is bad  Insomnia- uses Ambien, half to a quarter when needed  Review of Systems:  Review of Systems  Constitutional:  Negative for chills, fever and weight loss.  HENT:  Negative for tinnitus.   Respiratory:  Negative for cough, sputum production and shortness of breath.   Cardiovascular:  Negative for chest pain, palpitations and leg swelling.  Gastrointestinal:  Negative for abdominal pain, constipation, diarrhea and heartburn.  Genitourinary:  Negative for dysuria, frequency and urgency.  Musculoskeletal:  Negative for back pain, falls, joint pain and myalgias.  Skin: Negative.   Neurological:  Negative for dizziness and  headaches.  Psychiatric/Behavioral:  Negative for depression and memory loss. The patient does not have insomnia.     Past Medical History:  Diagnosis Date   Cancer (HCC) 2001   breast    Cataracts, bilateral    GERD (gastroesophageal reflux disease)    Hyperlipidemia    Past Surgical History:  Procedure Laterality Date   BREAST SURGERY     RADIAL KERATOTOMY     Social History:   reports that she quit smoking about 32 years ago. Her smoking use included cigarettes. She has never used smokeless tobacco. She reports current alcohol use of about 8.0 standard drinks of alcohol per week. She reports that she does not use drugs.  Family History  Problem Relation Age of Onset   Hyperlipidemia Mother    Alzheimer's disease Sister    Esophageal cancer Cousin 46   Colon cancer Neg Hx    Stomach cancer Neg Hx     Medications: Patient's Medications  New Prescriptions   No medications on file  Previous Medications   CALCIUM CARBONATE (CALCIUM 600) 600 MG TABS TABLET    Take 1 tablet (600 mg total) by mouth 2 (two) times daily with a meal.   CHOLECALCIFEROL (VITAMIN D3 PO)    Take 500 Units by mouth daily.   CYANOCOBALAMIN (VITAMIN B 12 PO)    Take 1 tablet by mouth.   FAMOTIDINE (PEPCID) 20 MG TABLET  Take 20 mg by mouth at bedtime.   FISH OIL-OMEGA-3 FATTY ACIDS 1000 MG CAPSULE    Take 1 g by mouth daily.    MISC NATURAL PRODUCTS (GLUCOSAMINE CHONDROITIN ADV PO)    Take 1 tablet by mouth 2 (two) times daily.   MULTIPLE VITAMIN (MULTIVITAMIN) TABLET    Take 1 tablet by mouth daily.   PANTOPRAZOLE (PROTONIX) 40 MG TABLET    Take 1 tablet (40 mg total) by mouth daily.   ROSUVASTATIN (CRESTOR) 5 MG TABLET    TAKE 1 TABLET(5 MG) BY MOUTH DAILY   UNABLE TO FIND    Med Name: Bone Up Calcium  supplement 333.3 mg , 1-2 times daily   VENLAFAXINE (EFFEXOR) 75 MG TABLET    TAKE 1 TABLET(75 MG) BY MOUTH TWICE DAILY   ZOLPIDEM (AMBIEN) 5 MG TABLET    TAKE 1/2 TABLET(2.5 MG) BY MOUTH AT BEDTIME  AS NEEDED FOR SLEEP  Modified Medications   No medications on file  Discontinued Medications   No medications on file    Physical Exam:  Vitals:   07/15/23 1056  BP: 128/84  Pulse: 70  Temp: 98.6 F (37 C)  SpO2: 98%  Weight: 177 lb 9.6 oz (80.6 kg)  Height: 5\' 8"  (1.727 m)   Body mass index is 27 kg/m. Wt Readings from Last 3 Encounters:  07/15/23 177 lb 9.6 oz (80.6 kg)  01/07/23 183 lb 3.2 oz (83.1 kg)  06/04/22 181 lb (82.1 kg)    Physical Exam Constitutional:      General: She is not in acute distress.    Appearance: She is well-developed. She is not diaphoretic.  HENT:     Head: Normocephalic and atraumatic.     Mouth/Throat:     Pharynx: No oropharyngeal exudate.  Eyes:     Conjunctiva/sclera: Conjunctivae normal.     Pupils: Pupils are equal, round, and reactive to light.  Cardiovascular:     Rate and Rhythm: Normal rate and regular rhythm.     Heart sounds: Normal heart sounds.  Pulmonary:     Effort: Pulmonary effort is normal.     Breath sounds: Normal breath sounds.  Abdominal:     General: Bowel sounds are normal.     Palpations: Abdomen is soft.  Musculoskeletal:     Cervical back: Normal range of motion and neck supple.     Right lower leg: No edema.     Left lower leg: No edema.  Skin:    General: Skin is warm and dry.  Neurological:     Mental Status: She is alert.  Psychiatric:        Mood and Affect: Mood normal.     Labs reviewed: Basic Metabolic Panel: Recent Labs    01/07/23 1525  NA 138  K 4.5  CL 103  CO2 28  GLUCOSE 88  BUN 15  CREATININE 0.70  CALCIUM 9.4   Liver Function Tests: Recent Labs    01/07/23 1525  AST 16  ALT 16  BILITOT 0.3  PROT 6.8   No results for input(s): "LIPASE", "AMYLASE" in the last 8760 hours. No results for input(s): "AMMONIA" in the last 8760 hours. CBC: Recent Labs    01/07/23 1525  WBC 7.2  NEUTROABS 3,722  HGB 13.1  HCT 38.7  MCV 91.5  PLT 241   Lipid Panel: Recent  Labs    01/07/23 1525  CHOL 214*  HDL 81  LDLCALC 107*  TRIG 149  CHOLHDL 2.6  TSH: No results for input(s): "TSH" in the last 8760 hours. A1C: No results found for: "HGBA1C"   Assessment/Plan 1. Gastroesophageal reflux disease without esophagitis Ongoing, continues on pepcid and tum PRN. She has a lot of break through GERD that tums helps. Has scheduled endoscopy.   2. Anxiety Ongoing, stable at this time, a lot of stress and anxiety due to caregiver role strain with her sister who has progressive memory loss and trying to care for her.  She had previous Rx for lorazepam that she took sparingly and has now ran out- asking for refill.  - LORazepam (ATIVAN) 0.5 MG tablet; Take 1 tablet (0.5 mg total) by mouth daily as needed for anxiety.  Dispense: 10 tablet; Refill: 0  3. Mixed hyperlipidemia Stable on crestor, continue dietary modifications. - Lipid panel - COMPLETE METABOLIC PANEL WITH GFR - CBC with Differential/Platelet  4. Insomnia, unspecified type -stable, uses ambein sparingly when needed Understands not to take Remus Loffler if she has already taken lorazepam.   5. Osteopenia of multiple sites -Recommended to take calcium 600 mg twice daily with Vitamin D 2000 units daily and weight bearing activity 30 mins/5 days a week  6. Osteoarthritis of left shoulder, unspecified osteoarthritis type -stable at this time, continues to follow up with orthopedic.   Return in about 6 months (around 01/13/2024) for routine follow up.  Janene Harvey. Biagio Borg [facility] [phone]

## 2023-07-16 LAB — COMPLETE METABOLIC PANEL WITH GFR
AG Ratio: 1.7 (calc) (ref 1.0–2.5)
ALT: 14 U/L (ref 6–29)
AST: 16 U/L (ref 10–35)
Albumin: 4.1 g/dL (ref 3.6–5.1)
Alkaline phosphatase (APISO): 53 U/L (ref 37–153)
BUN: 16 mg/dL (ref 7–25)
CO2: 28 mmol/L (ref 20–32)
Calcium: 9.7 mg/dL (ref 8.6–10.4)
Chloride: 103 mmol/L (ref 98–110)
Creat: 0.79 mg/dL (ref 0.60–1.00)
Globulin: 2.4 g/dL (ref 1.9–3.7)
Glucose, Bld: 90 mg/dL (ref 65–99)
Potassium: 4.7 mmol/L (ref 3.5–5.3)
Sodium: 139 mmol/L (ref 135–146)
Total Bilirubin: 0.4 mg/dL (ref 0.2–1.2)
Total Protein: 6.5 g/dL (ref 6.1–8.1)
eGFR: 79 mL/min/{1.73_m2} (ref >=60)

## 2023-07-16 LAB — CBC WITH DIFFERENTIAL/PLATELET
Absolute Monocytes: 538 {cells}/uL (ref 200–950)
Basophils Absolute: 39 {cells}/uL (ref 0–200)
Basophils Relative: 0.7 %
Eosinophils Absolute: 129 {cells}/uL (ref 15–500)
Eosinophils Relative: 2.3 %
HCT: 38.6 % (ref 35.0–45.0)
Hemoglobin: 12.7 g/dL (ref 11.7–15.5)
Lymphs Abs: 2134 {cells}/uL (ref 850–3900)
MCH: 31.2 pg (ref 27.0–33.0)
MCHC: 32.9 g/dL (ref 32.0–36.0)
MCV: 94.8 fL (ref 80.0–100.0)
MPV: 12.6 fL — ABNORMAL HIGH (ref 7.5–12.5)
Monocytes Relative: 9.6 %
Neutro Abs: 2761 {cells}/uL (ref 1500–7800)
Neutrophils Relative %: 49.3 %
Platelets: 229 10*3/uL (ref 140–400)
RBC: 4.07 10*6/uL (ref 3.80–5.10)
RDW: 12.5 % (ref 11.0–15.0)
Total Lymphocyte: 38.1 %
WBC: 5.6 10*3/uL (ref 3.8–10.8)

## 2023-07-16 LAB — LIPID PANEL
Cholesterol: 196 mg/dL (ref <200)
HDL: 73 mg/dL (ref >=50)
LDL Cholesterol (Calc): 104 mg/dL — ABNORMAL HIGH
Non-HDL Cholesterol (Calc): 123 mg/dL (ref <130)
Total CHOL/HDL Ratio: 2.7 (calc) (ref <5.0)
Triglycerides: 98 mg/dL (ref <150)

## 2023-10-20 ENCOUNTER — Ambulatory Visit: Payer: Medicare Other | Admitting: Gastroenterology

## 2023-10-20 ENCOUNTER — Encounter: Payer: Self-pay | Admitting: Gastroenterology

## 2023-10-20 VITALS — BP 128/78 | HR 77 | Ht 67.0 in | Wt 179.0 lb

## 2023-10-20 DIAGNOSIS — R1013 Epigastric pain: Secondary | ICD-10-CM

## 2023-10-20 DIAGNOSIS — R12 Heartburn: Secondary | ICD-10-CM

## 2023-10-20 DIAGNOSIS — Z860100 Personal history of colon polyps, unspecified: Secondary | ICD-10-CM

## 2023-10-20 MED ORDER — SUFLAVE 178.7 G PO SOLR: 1.0000 | Freq: Once | ORAL | 0 refills | Status: AC

## 2023-10-20 NOTE — Progress Notes (Signed)
[facility] Consult Note:  History: Theresa Pena 10/20/2023  Referring provider: Sharon Seller, NP  Reason for consult/chief complaint: Colonoscopy (Pt states she would like to discuss a EGD colon. ) and Gastroesophageal Reflux (Pt states she thinks she has a ulcer because it burn when she eats.)   Subjective  Prior history: Screening colonoscopy with Dr. Myrtie Neither April 2018 -2 subcentimeter tubular adenomas EGD with Dr. Myrtie Neither April 2018 for evaluation of persistent heartburn.  1 to 2 cm hiatal hernia, otherwise normal exam.  Primary care note October 2024 indicates patient had GI side effects from omeprazole and did not wish to try other PPI.  Is on famotidine but having breakthrough reflux symptoms.   ____________________   Theresa Pena wanted to discuss a surveillance colonoscopy but also address some symptoms that are bothered her for at least the last several months.  At least a few times a week she will have a postprandial burning discomfort in the epigastrium that will last maybe just a few minutes and not recur for the rest of a meal.  She denies nausea vomiting, dysphagia or radiation of the pain.  Currently taking famotidine 20 mg in the evening.  When she had taken the medicine at higher dose or more frequently she felt like it might work well initially and then lose its effect, so she thought she may be getting tolerant to it and then decrease the dose.   She is always tended toward constipation, denies rectal bleeding.   ROS:  Review of Systems  Constitutional:  Negative for appetite change and unexpected weight change.  HENT:  Negative for mouth sores and voice change.   Eyes:  Negative for pain and redness.  Respiratory:  Negative for cough and shortness of breath.   Cardiovascular:  Negative for chest pain and palpitations.  Genitourinary:  Negative for dysuria and hematuria.  Musculoskeletal:  Negative for arthralgias and myalgias.  Skin:   Negative for pallor and rash.  Neurological:  Negative for weakness and headaches.  Hematological:  Negative for adenopathy.     Past Medical History: Past Medical History:  Diagnosis Date   Cancer (HCC) 2001   breast    Cataracts, bilateral    GERD (gastroesophageal reflux disease)    Hyperlipidemia      Past Surgical History: Past Surgical History:  Procedure Laterality Date   BREAST SURGERY     RADIAL KERATOTOMY       Family History: Family History  Problem Relation Age of Onset   Hyperlipidemia Mother    Alzheimer's disease Sister    Esophageal cancer Cousin 45   Colon cancer Neg Hx    Liver disease Neg Hx     Social History: Social History   Socioeconomic History   Marital status: Divorced    Spouse name: Not on file   Number of children: 0   Years of education: Not on file   Highest education level: Not on file  Occupational History   Occupation: house cleaner  Tobacco Use   Smoking status: Former    Current packs/day: 0.00    Types: Cigarettes    Quit date: 10/04/1990    Years since quitting: 33.0   Smokeless tobacco: Never  Vaping Use   Vaping status: Never Used  Substance and Sexual Activity   Alcohol use: Yes    Alcohol/week: 8.0 standard drinks of alcohol    Types: 8 Glasses of wine per week    Comment: occ   Drug use: Never  Sexual activity: Not on file  Other Topics Concern   Not on file  Social History Narrative   Diet:      Do you drink/ eat things with caffeine? Coffee 2-3 cups /day      Marital status:  Divorced                             What year were you married ? 2004      Do you live in a house, apartment,assistred living, condo, trailer, etc.)? House      Is it one or more stories? 1      How many persons live in your home ? 1      Do you have any pets in your home ?(please list)  1 dog      Current or past profession: Residential cleaning      Do you exercise?                              Type & how often: Rarely       Do you have a living will? Yes      Do you have a DNR form?   No                    If not, do you want to discuss one? Yes      Do you have signed POA?HPOA forms?   Yes              If so, please bring to your        appointment      Social Drivers of Health   Financial Resource Strain: Low Risk  (09/23/2017)   Overall Financial Resource Strain (CARDIA)    Difficulty of Paying Living Expenses: Not hard at all  Food Insecurity: No Food Insecurity (09/23/2017)   Hunger Vital Sign    Worried About Running Out of Food in the Last Year: Never true    Ran Out of Food in the Last Year: Never true  Transportation Needs: No Transportation Needs (09/23/2017)   PRAPARE - Administrator, Civil Service (Medical): No    Lack of Transportation (Non-Medical): No  Physical Activity: Sufficiently Active (09/23/2017)   Exercise Vital Sign    Days of Exercise per Week: 4 days    Minutes of Exercise per Session: 60 min  Stress: No Stress Concern Present (09/23/2017)   [employer] of Occupational Health - Occupational Stress Questionnaire    Feeling of Stress : Only a little  Social Connections: Moderately Isolated (09/23/2017)   Social Connection and Isolation Panel [NHANES]    Frequency of Communication with Friends and Family: Twice a week    Frequency of Social Gatherings with Friends and Family: Once a week    Attends Religious Services: Never    Database administrator or Organizations: No    Attends Banker Meetings: Never    Marital Status: Divorced    Allergies: Allergies  Allergen Reactions   Codeine Nausea Only    Also has dizziness   Omeprazole Other (See Comments)    GI upset     Outpatient Meds: Current Outpatient Medications  Medication Sig Dispense Refill   calcium carbonate (CALCIUM 600) 600 MG TABS tablet Take 1 tablet (600 mg total) by mouth 2 (two) times daily with a meal.  Cholecalciferol (VITAMIN D3 PO) Take 500 Units by  mouth daily.     Cyanocobalamin (VITAMIN B 12 PO) Take 1 tablet by mouth.     famotidine (PEPCID) 20 MG tablet Take 20 mg by mouth at bedtime.     fish oil-omega-3 fatty acids 1000 MG capsule Take 1 g by mouth daily.      LORazepam (ATIVAN) 0.5 MG tablet Take 1 tablet (0.5 mg total) by mouth daily as needed for anxiety. 10 tablet 0   Misc Natural Products (GLUCOSAMINE CHONDROITIN ADV PO) Take 1 tablet by mouth 2 (two) times daily.     Multiple Vitamin (MULTIVITAMIN) tablet Take 1 tablet by mouth daily.     pantoprazole (PROTONIX) 40 MG tablet Take 1 tablet (40 mg total) by mouth daily. 30 tablet 3   PEG 3350-KCl-NaCl-NaSulf-MgSul (SUFLAVE) 178.7 g SOLR Take 1 each by mouth once for 1 dose. 1 each 0   rosuvastatin (CRESTOR) 5 MG tablet TAKE 1 TABLET(5 MG) BY MOUTH DAILY 90 tablet 3   UNABLE TO FIND Med Name: Bone Up Calcium  supplement 333.3 mg , 1-2 times daily     venlafaxine (EFFEXOR) 75 MG tablet TAKE 1 TABLET(75 MG) BY MOUTH TWICE DAILY 180 tablet 3   zolpidem (AMBIEN) 5 MG tablet TAKE 1/2 TABLET(2.5 MG) BY MOUTH AT BEDTIME AS NEEDED FOR SLEEP 30 tablet 2   No current facility-administered medications for this visit.      ___________________________________________________________________ Objective   Exam:  BP 128/78   Pulse 77   Ht 5\' 7"  (1.702 m)   Wt 179 lb (81.2 kg)   BMI 28.04 kg/m  Wt Readings from Last 3 Encounters:  10/20/23 179 lb (81.2 kg)  07/15/23 177 lb 9.6 oz (80.6 kg)  01/07/23 183 lb 3.2 oz (83.1 kg)    General: Well-appearing, normal vocal quality Eyes: sclera anicteric, no redness ENT: oral mucosa moist without lesions, no cervical or supraclavicular lymphadenopathy CV: Regular without appreciable murmur, no JVD, no peripheral edema Resp: clear to auscultation bilaterally, normal RR and effort noted GI: soft, no tenderness, with active bowel sounds. No guarding or palpable organomegaly noted. Skin; warm and dry, no rash or jaundice noted Neuro: awake,  alert and oriented x 3. Normal gross motor function and fluent speech   Labs:     Latest Ref Rng & Units 07/15/2023   11:22 AM 01/07/2023    3:25 PM 06/04/2022   11:19 AM  CBC  WBC 3.8 - 10.8 Thousand/uL 5.6  7.2  4.8   Hemoglobin 11.7 - 15.5 g/dL 09.8  11.9  14.7   Hematocrit 35.0 - 45.0 % 38.6  38.7  39.3   Platelets 140 - 400 Thousand/uL 229  241  240       Latest Ref Rng & Units 07/15/2023   11:22 AM 01/07/2023    3:25 PM 06/04/2022   11:19 AM  CMP  Glucose 65 - 99 mg/dL 90  88  92   BUN 7 - 25 mg/dL 16  15  14    Creatinine 0.60 - 1.00 mg/dL 8.29  5.62  1.30   Sodium 135 - 146 mmol/L 139  138  137   Potassium 3.5 - 5.3 mmol/L 4.7  4.5  4.8   Chloride 98 - 110 mmol/L 103  103  104   CO2 20 - 32 mmol/L 28  28  26    Calcium 8.6 - 10.4 mg/dL 9.7  9.4  9.3   Total Protein 6.1 - 8.1 g/dL 6.5  6.8  6.4   Total Bilirubin 0.2 - 1.2 mg/dL 0.4  0.3  0.4   AST 10 - 35 U/L 16  16  17    ALT 6 - 29 U/L 14  16  16        Encounter Diagnoses  Name Primary?   Epigastric pain Yes   Heartburn    Hx of colonic polyps     Assessment and Plan Chronic heartburn from GERD, some breakthrough symptoms initially reported, but it is really described more as an epigastric burning.  It is possible that is an evolution of her reflux and a somewhat atypical location of GERD pain, especially since it is relatively brief and of a burning quality.  However, she also says that sometimes the pain is more sharp and might double her over.  This might be consistent with biliary colic, though it only lasts a few minutes.  Last LFTs normal. She is due for colon polyp surveillance     Plan:  Surveillance colonoscopy and diagnostic upper endoscopy. She was agreeable after discussion of procedure and risks.  The benefits and risks of the planned procedure were described in detail with the patient or (when appropriate) their health care proxy.  Risks were outlined as including, but not limited to, bleeding,  infection, perforation, adverse medication reaction leading to cardiac or pulmonary decompensation, pancreatitis (if ERCP).  The limitation of incomplete mucosal visualization was also discussed.  No guarantees or warranties were given.  No changes in medicine at this point, we will wait and see what endoscopic findings there are.  She reports having a lot going on these days with some personal and family appointments and commitments, so getting to visits and testing has been a challenge for her.  I talked about a possible right upper quadrant ultrasound to look for gallstones.  After some discussion, we decided to hold off until after the procedures unless she has an escalation of upper abdominal pain in the interim.  Thank you for the courtesy of this consult.  Please call me with any questions or concerns.  Charlie Pitter III  CC: Referring provider noted above

## 2023-10-20 NOTE — Patient Instructions (Addendum)
You have been scheduled for a colonoscopy/endo. Please follow written instructions given to you at your visit today.   Please pick up your prep supplies at the pharmacy within the next 1-3 days.  If you use inhalers (even only as needed), please bring them with you on the day of your procedure.  DO NOT TAKE 7 DAYS PRIOR TO TEST- Trulicity (dulaglutide) Ozempic, Wegovy (semaglutide) Mounjaro (tirzepatide) Bydureon Bcise (exanatide extended release)  DO NOT TAKE 1 DAY PRIOR TO YOUR TEST Rybelsus (semaglutide) Adlyxin (lixisenatide) Victoza (liraglutide) Byetta (exanatide) ___________________________________________________________________________  Bonita Quin will receive your bowel preparation through [facility], which ensures the lowest copay and home delivery, with outreach via text or call from an [phone] number. Please respond promptly to avoid rescheduling. If you are interested in alternative options or have any questions please contact them at [phone]  Your Provider Has Sent Your Bowel Prep Regimen To [facility] What to expect. [facility] will contact you to verify your information and collect your copay, if applicable. Enjoy the comfort of your home while we deliver your prescription to you, free of any shipping charges. Fast, FREE delivery or shipping. [facility] accepts all major insurance benefits and applies discounts & coupons  Have additional questions? [facility]'s patient care team is always here to help.  Chat: [email or web address] Call: [phone] Email: [email or web address] NCPDP: [id number] How will we contact you? Welcome Phone call  a Welcome text and a Checkout link in a text Texts you receive from [phone] Are Not Spam.   *To set up delivery, you must complete the checkout process via link or speak to one of our patient care representatives. If we are unable to reach you, your prescription may be  delayed.  _______________________________________________________  If your blood pressure at your visit was 140/90 or greater, please contact your primary care physician to follow up on this.  _______________________________________________________  If you are age 74 or older, your body mass index should be between 23-30. Your Body mass index is 28.04 kg/m. If this is out of the aforementioned range listed, please consider follow up with your Primary Care Provider.  If you are age 44 or younger, your body mass index should be between 19-25. Your Body mass index is 28.04 kg/m. If this is out of the aformentioned range listed, please consider follow up with your Primary Care Provider.   ________________________________________________________  The [facility] providers would like to encourage you to use [facility] to communicate with providers for non-urgent requests or questions.  Due to long hold times on the telephone, sending your provider a message by [facility] may be a faster and more efficient way to get a response.  Please allow 48 business hours for a response.  Please remember that this is for non-urgent requests.  _______________________________________________________ It was a pleasure to see you today!  Thank you for trusting me with your gastrointestinal care!

## 2023-10-28 ENCOUNTER — Other Ambulatory Visit: Payer: Self-pay

## 2023-10-28 ENCOUNTER — Encounter: Payer: Self-pay | Admitting: Orthopedic Surgery

## 2023-10-28 ENCOUNTER — Ambulatory Visit: Payer: Medicare Other | Admitting: Orthopedic Surgery

## 2023-10-28 DIAGNOSIS — M19012 Primary osteoarthritis, left shoulder: Secondary | ICD-10-CM

## 2023-10-28 MED ORDER — BUPIVACAINE HCL 0.5 % IJ SOLN
9.0000 mL | INTRAMUSCULAR | Status: AC | PRN
Start: 2023-10-28 — End: 2023-10-28
  Administered 2023-10-28: 9 mL via INTRA_ARTICULAR

## 2023-10-28 MED ORDER — LIDOCAINE HCL 1 % IJ SOLN
5.0000 mL | INTRAMUSCULAR | Status: AC | PRN
Start: 2023-10-28 — End: 2023-10-28
  Administered 2023-10-28: 5 mL

## 2023-10-28 MED ORDER — METHYLPREDNISOLONE ACETATE 40 MG/ML IJ SUSP
40.0000 mg | INTRAMUSCULAR | Status: AC | PRN
Start: 2023-10-28 — End: 2023-10-28
  Administered 2023-10-28: 40 mg via INTRA_ARTICULAR

## 2023-10-28 NOTE — Progress Notes (Signed)
Office Visit Note   Patient: Theresa Pena           Date of Birth: 06/14/1951           MRN: [id number] Visit Date: 10/28/2023 Requested by: Sharon Seller, NP [address] [place]. [place],  [place] [address] PCP: Sharon Seller, NP  Subjective: Chief Complaint  Patient presents with   Left Shoulder - Pain    HPI: Theresa Pena is a 73 y.o. female who presents to the office reporting left shoulder pain.  Last cortisone injection 04/15/2023 gave very good relief for the last 6 months.  Patient continues to clean houses.  Does describe pain and limited range of motion.  Takes Aleve..                ROS: All systems reviewed are negative as they relate to the chief complaint within the history of present illness.  Patient denies fevers or chills.  Assessment & Plan: Visit Diagnoses:  1. Primary osteoarthritis, left shoulder     Plan: Impression is left shoulder arthritis doing well with cortisone injections.  Repeat cortisone injection performed today.  We will see her back as needed.  Follow-Up Instructions: No follow-ups on file.   Orders:  Orders Placed This Encounter  Procedures   US Guided Needle Placement - No Linked Charges   No orders of the defined types were placed in this encounter.     Procedures: Large Joint Inj: L glenohumeral on 10/28/2023 12:31 PM Indications: diagnostic evaluation and pain Details: 18 G 1.5 in needle, posterior approach  Arthrogram: No  Medications: 9 mL bupivacaine 0.5 %; 40 mg methylPREDNISolone acetate 40 MG/ML; 5 mL lidocaine 1 % Outcome: tolerated well, no immediate complications Procedure, treatment alternatives, risks and benefits explained, specific risks discussed. Consent was given by the patient. Immediately prior to procedure a time out was called to verify the correct patient, procedure, equipment, support staff and site/side marked as required. Patient was prepped and draped in the usual sterile fashion.        Clinical Data: No additional findings.  Objective: Vital Signs: There were no vitals taken for this visit.  Physical Exam:  Constitutional: Patient appears well-developed HEENT:  Head: Normocephalic Eyes:EOM are normal Neck: Normal range of motion Cardiovascular: Normal rate Pulmonary/chest: Effort normal Neurologic: Patient is alert Skin: Skin is warm Psychiatric: Patient has normal mood and affect  Ortho Exam: Ortho exam demonstrates functional deltoid but restricted range of motion passively 15/85/95.  There is some coarse grinding with passive range of motion.  Specialty Comments:  No specialty comments available.  Imaging: No results found.   PMFS History: Patient Active Problem List   Diagnosis Date Noted   Mixed hyperlipidemia 09/23/2017   High risk medication use 09/23/2017   Insomnia 09/23/2017   Gastroesophageal reflux disease 07/22/2017   History of breast cancer in female 03/14/2015   Mood disorder (HCC) 02/24/2012   Past Medical History:  Diagnosis Date   Cancer (HCC) 2001   breast    Cataracts, bilateral    GERD (gastroesophageal reflux disease)    Hyperlipidemia     Family History  Problem Relation Age of Onset   Hyperlipidemia Mother    Alzheimer's disease Sister    Esophageal cancer Cousin 40   Colon cancer Neg Hx    Liver disease Neg Hx     Past Surgical History:  Procedure Laterality Date   BREAST SURGERY     RADIAL KERATOTOMY  Social History   Occupational History   Occupation: Financial trader  Tobacco Use   Smoking status: Former    Current packs/day: 0.00    Types: Cigarettes    Quit date: 10/04/1990    Years since quitting: 33.0   Smokeless tobacco: Never  Vaping Use   Vaping status: Never Used  Substance and Sexual Activity   Alcohol use: Yes    Alcohol/week: 8.0 standard drinks of alcohol    Types: 8 Glasses of wine per week    Comment: occ   Drug use: Never   Sexual activity: Not on file

## 2023-12-21 ENCOUNTER — Encounter: Payer: Self-pay | Admitting: Gastroenterology

## 2023-12-29 ENCOUNTER — Ambulatory Visit: Payer: Medicare Other | Admitting: Gastroenterology

## 2023-12-29 ENCOUNTER — Encounter: Payer: Self-pay | Admitting: Gastroenterology

## 2023-12-29 VITALS — BP 148/76 | HR 59 | Temp 98.0°F | Resp 13 | Ht 67.0 in | Wt 179.0 lb

## 2023-12-29 DIAGNOSIS — Z860101 Personal history of adenomatous and serrated colon polyps: Secondary | ICD-10-CM | POA: Diagnosis not present

## 2023-12-29 DIAGNOSIS — K648 Other hemorrhoids: Secondary | ICD-10-CM | POA: Diagnosis not present

## 2023-12-29 DIAGNOSIS — Z860100 Personal history of colon polyps, unspecified: Secondary | ICD-10-CM | POA: Diagnosis not present

## 2023-12-29 DIAGNOSIS — R1013 Epigastric pain: Secondary | ICD-10-CM | POA: Diagnosis not present

## 2023-12-29 DIAGNOSIS — D122 Benign neoplasm of ascending colon: Secondary | ICD-10-CM | POA: Diagnosis not present

## 2023-12-29 DIAGNOSIS — K209 Esophagitis, unspecified without bleeding: Secondary | ICD-10-CM | POA: Diagnosis not present

## 2023-12-29 DIAGNOSIS — D124 Benign neoplasm of descending colon: Secondary | ICD-10-CM | POA: Diagnosis not present

## 2023-12-29 DIAGNOSIS — K5730 Diverticulosis of large intestine without perforation or abscess without bleeding: Secondary | ICD-10-CM | POA: Diagnosis not present

## 2023-12-29 DIAGNOSIS — K449 Diaphragmatic hernia without obstruction or gangrene: Secondary | ICD-10-CM | POA: Diagnosis not present

## 2023-12-29 DIAGNOSIS — Z1211 Encounter for screening for malignant neoplasm of colon: Secondary | ICD-10-CM | POA: Diagnosis not present

## 2023-12-29 DIAGNOSIS — K319 Disease of stomach and duodenum, unspecified: Secondary | ICD-10-CM | POA: Diagnosis not present

## 2023-12-29 DIAGNOSIS — K208 Other esophagitis without bleeding: Secondary | ICD-10-CM | POA: Diagnosis not present

## 2023-12-29 DIAGNOSIS — K3189 Other diseases of stomach and duodenum: Secondary | ICD-10-CM | POA: Diagnosis not present

## 2023-12-29 MED ORDER — SODIUM CHLORIDE 0.9 % IV SOLN: 500.0000 mL | Freq: Once | INTRAVENOUS | Status: DC

## 2023-12-29 NOTE — Progress Notes (Signed)
 Drowsy, VSS, resps reg and even. Report to RN

## 2023-12-29 NOTE — Progress Notes (Signed)
 Called to room to assist during endoscopic procedure.  Patient ID and intended procedure confirmed with present staff. Received instructions for my participation in the procedure from the performing physician.

## 2023-12-29 NOTE — Progress Notes (Signed)
 Updated medical record.

## 2023-12-29 NOTE — Patient Instructions (Signed)
-   Resume previous diet - Continue present medications. - Await pathology results - Perform a RUQ ultrasound at appointment to be scheduled.  YOU HAD AN ENDOSCOPIC PROCEDURE TODAY AT THE [facility]:   Refer to the procedure report that was given to you for any specific questions about what was found during the examination.  If the procedure report does not answer your questions, please call your gastroenterologist to clarify.  If you requested that your care partner not be given the details of your procedure findings, then the procedure report has been included in a sealed envelope for you to review at your convenience later.  YOU SHOULD EXPECT: Some feelings of bloating in the abdomen. Passage of more gas than usual.  Walking can help get rid of the air that was put into your GI tract during the procedure and reduce the bloating. If you had a lower endoscopy (such as a colonoscopy or flexible sigmoidoscopy) you may notice spotting of blood in your stool or on the toilet paper. If you underwent a bowel prep for your procedure, you may not have a normal bowel movement for a few days.  Please Note:  You might notice some irritation and congestion in your nose or some drainage.  This is from the oxygen used during your procedure.  There is no need for concern and it should clear up in a day or so.  SYMPTOMS TO REPORT IMMEDIATELY:  Following lower endoscopy (colonoscopy or flexible sigmoidoscopy):  Excessive amounts of blood in the stool  Significant tenderness or worsening of abdominal pains  Swelling of the abdomen that is new, acute  Fever of 100F or higher  Following upper endoscopy (EGD)  Vomiting of blood or coffee ground material  New chest pain or pain under the shoulder blades  Painful or persistently difficult swallowing  New shortness of breath  Fever of 100F or higher  Black, tarry-looking stools  For urgent or emergent issues, a gastroenterologist can be reached at  any hour by calling [phone]. Do not use MyChart messaging for urgent concerns.    DIET:  We do recommend a small meal at first, but then you may proceed to your regular diet.  Drink plenty of fluids but you should avoid alcoholic beverages for 24 hours.  ACTIVITY:  You should plan to take it easy for the rest of today and you should NOT DRIVE or use heavy machinery until tomorrow (because of the sedation medicines used during the test).    FOLLOW UP: Our staff will call the number listed on your records the next business day following your procedure.  We will call around 7:15- 8:00 am to check on you and address any questions or concerns that you may have regarding the information given to you following your procedure. If we do not reach you, we will leave a message.     If any biopsies were taken you will be contacted by phone or by letter within the next 1-3 weeks.  Please call us at [phone] if you have not heard about the biopsies in 3 weeks.    SIGNATURES/CONFIDENTIALITY: You and/or your care partner have signed paperwork which will be entered into your electronic medical record.  These signatures attest to the fact that that the information above on your After Visit Summary has been reviewed and is understood.  Full responsibility of the confidentiality of this discharge information lies with you and/or your care-partner.

## 2023-12-29 NOTE — Op Note (Signed)
 [facility] Patient Name: Theresa Pena Procedure Date: 12/29/2023 8:59 AM MRN: [id number] Endoscopist: Sherilyn Cooter L. Myrtie Neither , MD, [id number] Age: 73 Referring MD:  Date of Birth: 11-14-1950 Gender: Female Account #: [id number] Procedure:                Upper GI endoscopy Indications:              Epigastric abdominal pain (clinical details in                            January 2025 office note. Pain is typically of a                            burning quality but episodically sharp)                           Uses H2 blocker, had side effects from PPI Medicines:                Monitored Anesthesia Care Procedure:                Pre-Anesthesia Assessment:                           - Prior to the procedure, a History and Physical                            was performed, and patient medications and                            allergies were reviewed. The patient's tolerance of                            previous anesthesia was also reviewed. The risks                            and benefits of the procedure and the sedation                            options and risks were discussed with the patient.                            All questions were answered, and informed consent                            was obtained. Prior Anticoagulants: The patient has                            taken no anticoagulant or antiplatelet agents. ASA                            Grade Assessment: II - A patient with mild systemic                            disease. After reviewing the risks and benefits,  the patient was deemed in satisfactory condition to                            undergo the procedure.                           After obtaining informed consent, the endoscope was                            passed under direct vision. Throughout the                            procedure, the patient's blood pressure, pulse, and                            oxygen saturations were  monitored continuously. The                            Olympus Scope SN [id number] was introduced through the                            mouth, and advanced to the second part of duodenum.                            The upper GI endoscopy was accomplished without                            difficulty. The patient tolerated the procedure                            well. Scope In: Scope Out: Findings:                 The larynx was normal.                           A 7 cm hiatal hernia was present. (In both axial                            and approx. transverse dimensions)                           Localized mild mucosal changes characterized by                            inflammation and nodularity were found at the                            gastroesophageal junction. Biopsies were taken with                            a cold forceps for histology.                           Patchy erythematous mucosa was found in the  prepyloric region of the stomach. Biopsies were                            taken with a cold forceps for histology. (Antrum                            and body in 1 jar to rule out H. pylori)                           The exam of the stomach was otherwise normal.                            Distended well with insufflation                           The examined duodenum was normal. Complications:            No immediate complications. Estimated Blood Loss:     Estimated blood loss was minimal. Impression:               - Normal larynx.                           - 7 cm hiatal hernia.                           - Inflamed and nodular mucosa in the                            gastroesophageal junction. Biopsied. (Most likely                            benign and related to hiatal hernia, biopsy to rule                            out neoplasia)                           - Erythematous mucosa in the prepyloric region of                            the  stomach. Biopsied.                           - Normal examined duodenum.                           The sharp pain could be occurring from the hiatal                            hernia, but right upper quadrant ultrasound                            warranted to rule out gallstones. Recommendation:           - Patient has a contact number available for  emergencies. The signs and symptoms of potential                            delayed complications were discussed with the                            patient. Return to normal activities tomorrow.                            Written discharge instructions were provided to the                            patient.                           - Resume previous diet.                           - Continue present medications.                           - Await pathology results.                           - Perform a RUQ ultrasound at appointment to be                            scheduled.                           - See the other procedure note for documentation of                            additional recommendations. Jaylee Lantry L. Myrtie Neither, MD 12/29/2023 10:15:13 AM This report has been signed electronically.

## 2023-12-29 NOTE — Op Note (Signed)
 [facility] Patient Name: Rye Dorado Procedure Date: 12/29/2023 9:02 AM MRN: [id number] Endoscopist: Sherilyn Cooter L. Myrtie Neither , MD, [id number] Age: 73 Referring MD:  Date of Birth: [birth year] Gender: Female Account #: [id number] Procedure:                Colonoscopy Indications:              Surveillance: Personal history of adenomatous                            polyps on last colonoscopy > 5 years ago                           two [facility] TA April 2018 Medicines:                Monitored Anesthesia Care Procedure:                Pre-Anesthesia Assessment:                           - Prior to the procedure, a History and Physical                            was performed, and patient medications and                            allergies were reviewed. The patient's tolerance of                            previous anesthesia was also reviewed. The risks                            and benefits of the procedure and the sedation                            options and risks were discussed with the patient.                            All questions were answered, and informed consent                            was obtained. Prior Anticoagulants: The patient has                            taken no anticoagulant or antiplatelet agents. ASA                            Grade Assessment: II - A patient with mild systemic                            disease. After reviewing the risks and benefits,                            the patient was deemed in satisfactory condition to  undergo the procedure.                           After obtaining informed consent, the colonoscope                            was passed under direct vision. Throughout the                            procedure, the patient's blood pressure, pulse, and                            oxygen saturations were monitored continuously. The                            Olympus Scope SN [id number] was introduced through  the                            anus and advanced to the the cecum, identified by                            appendiceal orifice and ileocecal valve. The                            colonoscopy was somewhat difficult due to a                            redundant colon. Successful completion of the                            procedure was aided by using manual pressure and                            straightening and shortening the scope to obtain                            bowel loop reduction. The patient tolerated the                            procedure well. The quality of the bowel                            preparation was good. The ileocecal valve,                            appendiceal orifice, and rectum were photographed. Scope In: 9:43:03 AM Scope Out: 10:03:03 AM Scope Withdrawal Time: 0 hours 16 minutes 1 second  Total Procedure Duration: 0 hours 20 minutes 0 seconds  Findings:                 The perianal and digital rectal examinations were                            normal.  Repeat examination of right colon under NBI                            performed.                           A diminutive polyp was found in the distal                            ascending colon. The polyp was semi-sessile. The                            polyp was removed with a cold biopsy forceps.                            Resection and retrieval were complete.                           A 15 mm polyp was found in the descending colon.                            The polyp was semi-sessile. The polyp was removed                            with a piecemeal technique using a cold snare.                            Resection and retrieval were complete.                           Diverticula were found in the left colon and right                            colon.                           Internal hemorrhoids were found.                           The exam was otherwise without  abnormality on                            direct and retroflexion views. Complications:            No immediate complications. Estimated Blood Loss:     Estimated blood loss was minimal. Impression:               - One diminutive polyp in the distal ascending                            colon, removed with a cold biopsy forceps. Resected                            and retrieved.                           -  One 15 mm polyp in the descending colon, removed                            piecemeal using a cold snare. Resected and                            retrieved.                           - Diverticulosis in the left colon and in the right                            colon.                           - Internal hemorrhoids.                           - The examination was otherwise normal on direct                            and retroflexion views. Recommendation:           - Patient has a contact number available for                            emergencies. The signs and symptoms of potential                            delayed complications were discussed with the                            patient. Return to normal activities tomorrow.                            Written discharge instructions were provided to the                            patient.                           - Resume previous diet.                           - Continue present medications.                           - Await pathology results.                           - Repeat colonoscopy is recommended for                            surveillance. The colonoscopy date will be                            determined after pathology results from today's  exam become available for review. Lloyde Ludlam L. Myrtie Neither, MD 12/29/2023 10:19:50 AM This report has been signed electronically.

## 2023-12-29 NOTE — Progress Notes (Signed)
 History and Physical:  This patient presents for endoscopic testing for: Encounter Diagnoses  Name Primary?   Epigastric pain Yes   Hx of colonic polyps     73 yo woman here for endoscopic evaluation of GI symptoms and colon polyp surveillance. TA polyp < 10mm x 03 January 2017 Clinic visit 1/61/25 reporting epigastric pain No significant clinical changes since then.  Patient is otherwise without complaints or active issues today.   Past Medical History: Past Medical History:  Diagnosis Date   Cancer (HCC) 2001   breast    Cataracts, bilateral    GERD (gastroesophageal reflux disease)    Hyperlipidemia      Past Surgical History: Past Surgical History:  Procedure Laterality Date   BREAST SURGERY     COLONOSCOPY     RADIAL KERATOTOMY      Allergies: Allergies  Allergen Reactions   Codeine Nausea Only    Also has dizziness   Omeprazole Other (See Comments)    GI upset     Outpatient Meds: Current Outpatient Medications  Medication Sig Dispense Refill   calcium carbonate (CALCIUM 600) 600 MG TABS tablet Take 1 tablet (600 mg total) by mouth 2 (two) times daily with a meal.     famotidine (PEPCID) 20 MG tablet Take 20 mg by mouth at bedtime.     fish oil-omega-3 fatty acids 1000 MG capsule Take 1 g by mouth daily.      JUBLIA 10 % SOLN Apply topically.     LORazepam (ATIVAN) 0.5 MG tablet Take 1 tablet (0.5 mg total) by mouth daily as needed for anxiety. 10 tablet 0   pantoprazole (PROTONIX) 40 MG tablet Take 1 tablet (40 mg total) by mouth daily. 30 tablet 3   rosuvastatin (CRESTOR) 5 MG tablet TAKE 1 TABLET(5 MG) BY MOUTH DAILY 90 tablet 3   triamcinolone cream (KENALOG) 0.1 % SMARTSIG:1 Application Topical 2-3 Times Daily     venlafaxine (EFFEXOR) 75 MG tablet TAKE 1 TABLET(75 MG) BY MOUTH TWICE DAILY 180 tablet 3   zolpidem (AMBIEN) 5 MG tablet TAKE 1/2 TABLET(2.5 MG) BY MOUTH AT BEDTIME AS NEEDED FOR SLEEP 30 tablet 2   Cholecalciferol (VITAMIN D3 PO) Take 500  Units by mouth daily.     Cyanocobalamin (VITAMIN B 12 PO) Take 1 tablet by mouth.     Misc Natural Products (GLUCOSAMINE CHONDROITIN ADV PO) Take 1 tablet by mouth 2 (two) times daily.     Multiple Vitamin (MULTIVITAMIN) tablet Take 1 tablet by mouth daily.     UNABLE TO FIND Med Name: Bone Up Calcium  supplement 333.3 mg , 1-2 times daily     Current Facility-Administered Medications  Medication Dose Route Frequency Provider Last Rate Last Admin   0.9 %  sodium chloride infusion  500 mL Intravenous Once Danis, Starr Lake III, MD          ___________________________________________________________________ Objective   Exam:  BP (!) 156/98   Pulse 72   Temp 98 F (36.7 C) (Temporal)   Ht 5\' 7"  (1.702 m)   Wt 179 lb (81.2 kg)   SpO2 100%   BMI 28.04 kg/m   CV: regular , S1/S2 Resp: clear to auscultation bilaterally, normal RR and effort noted GI: soft, no tenderness, with active bowel sounds.   Assessment: Encounter Diagnoses  Name Primary?   Epigastric pain Yes   Hx of colonic polyps      Plan: Colonoscopy EGD  The benefits and risks of the planned procedure(s)  were described in detail with the patient or (when appropriate) their health care proxy.  Risks were outlined as including, but not limited to, bleeding, infection, perforation, adverse medication reaction leading to cardiac or pulmonary decompensation, pancreatitis (if ERCP).  The limitation of incomplete mucosal visualization was also discussed.  No guarantees or warranties were given.  The patient is appropriate for an endoscopic procedure in the ambulatory setting.   - Amada Jupiter, MD

## 2023-12-30 ENCOUNTER — Telehealth: Payer: Self-pay

## 2023-12-30 DIAGNOSIS — R1013 Epigastric pain: Secondary | ICD-10-CM

## 2023-12-30 NOTE — Telephone Encounter (Signed)
  Follow up Call-     12/29/2023    8:38 AM  Call back number  Post procedure Call Back phone  # [phone]  Permission to leave phone message Yes     Patient questions:  Do you have a fever, pain , or abdominal swelling? No. Pain Score  0 *  Have you tolerated food without any problems? Yes.    Have you been able to return to your normal activities? Yes.    Do you have any questions about your discharge instructions: Diet   No. Medications  No. Follow up visit  No.  Do you have questions or concerns about your Care? No.  Actions: * If pain score is 4 or above: No action needed, pain <4.

## 2023-12-30 NOTE — Telephone Encounter (Signed)
 Left message on voicemail (OK per patient) to inform patient that she is scheduled for ultrasound at [facility] on 01-06-24 at 11am.  Advised patient to arrive at 10:30am and NPO after midnight.  I will also send message to patient via MyChart.

## 2023-12-30 NOTE — Telephone Encounter (Signed)
 Left message for patient to return call to our office to schedule RUQ ultrasound per Dr Myrtie Neither.  Will continue efforts.

## 2024-01-03 LAB — SURGICAL PATHOLOGY

## 2024-01-05 ENCOUNTER — Telehealth: Payer: Self-pay | Admitting: Gastroenterology

## 2024-01-05 DIAGNOSIS — L821 Other seborrheic keratosis: Secondary | ICD-10-CM | POA: Diagnosis not present

## 2024-01-05 DIAGNOSIS — D225 Melanocytic nevi of trunk: Secondary | ICD-10-CM | POA: Diagnosis not present

## 2024-01-05 DIAGNOSIS — L814 Other melanin hyperpigmentation: Secondary | ICD-10-CM | POA: Diagnosis not present

## 2024-01-05 DIAGNOSIS — L820 Inflamed seborrheic keratosis: Secondary | ICD-10-CM | POA: Diagnosis not present

## 2024-01-05 DIAGNOSIS — Z85828 Personal history of other malignant neoplasm of skin: Secondary | ICD-10-CM | POA: Diagnosis not present

## 2024-01-05 DIAGNOSIS — L538 Other specified erythematous conditions: Secondary | ICD-10-CM | POA: Diagnosis not present

## 2024-01-05 DIAGNOSIS — Z86006 Personal history of melanoma in-situ: Secondary | ICD-10-CM | POA: Diagnosis not present

## 2024-01-05 DIAGNOSIS — L570 Actinic keratosis: Secondary | ICD-10-CM | POA: Diagnosis not present

## 2024-01-05 DIAGNOSIS — Z08 Encounter for follow-up examination after completed treatment for malignant neoplasm: Secondary | ICD-10-CM | POA: Diagnosis not present

## 2024-01-05 DIAGNOSIS — L2989 Other pruritus: Secondary | ICD-10-CM | POA: Diagnosis not present

## 2024-01-05 DIAGNOSIS — S70362A Insect bite (nonvenomous), left thigh, initial encounter: Secondary | ICD-10-CM | POA: Diagnosis not present

## 2024-01-05 DIAGNOSIS — C4362 Malignant melanoma of left upper limb, including shoulder: Secondary | ICD-10-CM | POA: Diagnosis not present

## 2024-01-05 NOTE — Telephone Encounter (Signed)
 Patient returning phone call regarding recent lab results.

## 2024-01-05 NOTE — Telephone Encounter (Signed)
 See results note.

## 2024-01-06 ENCOUNTER — Ambulatory Visit (HOSPITAL_COMMUNITY)
Admission: RE | Admit: 2024-01-06 | Discharge: 2024-01-06 | Disposition: A | Discharge status: Home / Self Care | Source: Ambulatory Visit | Attending: Gastroenterology | Admitting: Gastroenterology

## 2024-01-06 DIAGNOSIS — R1013 Epigastric pain: Secondary | ICD-10-CM | POA: Insufficient documentation

## 2024-01-06 DIAGNOSIS — R109 Unspecified abdominal pain: Secondary | ICD-10-CM | POA: Diagnosis not present

## 2024-01-13 ENCOUNTER — Encounter: Payer: Self-pay | Admitting: Nurse Practitioner

## 2024-01-13 ENCOUNTER — Ambulatory Visit: Payer: Medicare Other | Admitting: Nurse Practitioner

## 2024-01-13 VITALS — BP 124/80 | HR 65 | Temp 97.6°F | Ht 67.0 in | Wt 176.0 lb

## 2024-01-13 DIAGNOSIS — K219 Gastro-esophageal reflux disease without esophagitis: Secondary | ICD-10-CM | POA: Diagnosis not present

## 2024-01-13 DIAGNOSIS — M8589 Other specified disorders of bone density and structure, multiple sites: Secondary | ICD-10-CM

## 2024-01-13 DIAGNOSIS — T4145XA Adverse effect of unspecified anesthetic, initial encounter: Secondary | ICD-10-CM | POA: Diagnosis not present

## 2024-01-13 DIAGNOSIS — G4700 Insomnia, unspecified: Secondary | ICD-10-CM | POA: Diagnosis not present

## 2024-01-13 DIAGNOSIS — F419 Anxiety disorder, unspecified: Secondary | ICD-10-CM | POA: Diagnosis not present

## 2024-01-13 DIAGNOSIS — E782 Mixed hyperlipidemia: Secondary | ICD-10-CM | POA: Diagnosis not present

## 2024-01-13 NOTE — Patient Instructions (Signed)
 1.) Visit your local pharmacy to update your tetanus vaccine

## 2024-01-13 NOTE — Progress Notes (Signed)
 Careteam: Patient Care Team: Sharon Seller, NP as PCP - General (Geriatric Medicine) Glenford Peers, OD as Referring Physician (Optometry) Cherlyn Roberts, MD as Consulting Physician (Dermatology)  PLACE OF SERVICE:  [facility]  Advanced Directive information    Allergies  Allergen Reactions   Codeine Nausea Only    Also has dizziness   Omeprazole Other (See Comments)    GI upset     Chief Complaint  Patient presents with   Medical Management of Chronic Issues    6 month follow-up. Discussed need for Td/tdap, plans to get at local pharmacy. Fasting if labs needed. Scored 2 on depression screening    Discussed the use of AI scribe software for clinical note transcription with the patient, who gave verbal consent to proceed.  History of Present Illness   Theresa Pena is a 73 year old female who presents for a six-month follow-up visit.  She is managing her anxiety using effexor taken twice daily, and reports no recent exacerbations or changes in symptoms.  Following a recent colonoscopy, she experienced prolonged effects from anesthesia, including confusion and difficulty with routine tasks. These cognitive issues have gradually improved over the past two weeks. She is concerned about a family history of dementia, as her father and grandfather had similar post-surgical cognitive issues.  A recent gallbladder scan incidentally revealed a fatty liver. she does not take Tylenol routinely.  Her GERD is controlled with Protonix and famotidine. No current issues with indigestion or acid reflux. A recent endoscopy noted slight irritation at the bottom of her esophagus. She has a family history of esophageal cancer, necessitating regular endoscopies. She has follow up scheduled with GI but not until next month  She is mindful of her overall health, including dietary modifications and medication use. She is cautious with Aleve due to its potential impact on kidney  function. Her sister, has recently moved to an independent living facility with home care services and is reportedly doing well, which has been a relief for her.     Review of Systems:  Review of Systems  Constitutional:  Negative for chills, fever and weight loss.  HENT:  Negative for tinnitus.   Respiratory:  Negative for cough, sputum production and shortness of breath.   Cardiovascular:  Negative for chest pain, palpitations and leg swelling.  Gastrointestinal:  Negative for abdominal pain, constipation, diarrhea and heartburn.  Genitourinary:  Negative for dysuria, frequency and urgency.  Musculoskeletal:  Negative for back pain, falls, joint pain and myalgias.  Skin: Negative.   Neurological:  Negative for dizziness and headaches.  Psychiatric/Behavioral:  Negative for depression and memory loss. The patient does not have insomnia.     Past Medical History:  Diagnosis Date   Cancer (HCC) 2001   breast    Cataracts, bilateral    GERD (gastroesophageal reflux disease)    Hyperlipidemia    Past Surgical History:  Procedure Laterality Date   BREAST SURGERY     COLONOSCOPY     RADIAL KERATOTOMY     Social History:   reports that she quit smoking about 33 years ago. Her smoking use included cigarettes. She has never used smokeless tobacco. She reports current alcohol use of about 8.0 standard drinks of alcohol per week. She reports that she does not use drugs.  Family History  Problem Relation Age of Onset   Hyperlipidemia Mother    Alzheimer's disease Sister    Esophageal cancer Cousin 54   Colon cancer Neg Hx  Liver disease Neg Hx    Colon polyps Neg Hx     Medications: Patient's Medications  New Prescriptions   No medications on file  Previous Medications   CALCIUM CARBONATE (CALCIUM 600) 600 MG TABS TABLET    Take 1 tablet (600 mg total) by mouth 2 (two) times daily with a meal.   CHOLECALCIFEROL (VITAMIN D3 PO)    Take 500 Units by mouth daily.    CYANOCOBALAMIN (VITAMIN B 12 PO)    Take 1 tablet by mouth.   FAMOTIDINE (PEPCID) 20 MG TABLET    Take 20 mg by mouth at bedtime.   FISH OIL-OMEGA-3 FATTY ACIDS 1000 MG CAPSULE    Take 1 g by mouth daily.    JUBLIA 10 % SOLN    Apply topically.   LORAZEPAM (ATIVAN) 0.5 MG TABLET    Take 1 tablet (0.5 mg total) by mouth daily as needed for anxiety.   MISC NATURAL PRODUCTS (GLUCOSAMINE CHONDROITIN ADV PO)    Take 1 tablet by mouth 2 (two) times daily.   MULTIPLE VITAMIN (MULTIVITAMIN) TABLET    Take 1 tablet by mouth daily.   PANTOPRAZOLE (PROTONIX) 40 MG TABLET    Take 1 tablet (40 mg total) by mouth daily.   ROSUVASTATIN (CRESTOR) 5 MG TABLET    TAKE 1 TABLET(5 MG) BY MOUTH DAILY   TRIAMCINOLONE CREAM (KENALOG) 0.1 %    SMARTSIG:1 Application Topical 2-3 Times Daily   UNABLE TO FIND    Med Name: Bone Up Calcium  supplement 333.3 mg , 1-2 times daily   VENLAFAXINE (EFFEXOR) 75 MG TABLET    TAKE 1 TABLET(75 MG) BY MOUTH TWICE DAILY   ZOLPIDEM (AMBIEN) 5 MG TABLET    TAKE 1/2 TABLET(2.5 MG) BY MOUTH AT BEDTIME AS NEEDED FOR SLEEP  Modified Medications   No medications on file  Discontinued Medications   No medications on file    Physical Exam:  Vitals:   01/13/24 1104  BP: 124/80  Pulse: 65  Temp: 97.6 F (36.4 C)  TempSrc: Temporal  SpO2: 96%  Weight: 176 lb (79.8 kg)  Height: 5\' 7"  (1.702 m)   Body mass index is 27.57 kg/m. Wt Readings from Last 3 Encounters:  01/13/24 176 lb (79.8 kg)  12/29/23 179 lb (81.2 kg)  10/20/23 179 lb (81.2 kg)    Physical Exam Constitutional:      General: She is not in acute distress.    Appearance: She is well-developed. She is not diaphoretic.  HENT:     Head: Normocephalic and atraumatic.     Mouth/Throat:     Pharynx: No oropharyngeal exudate.  Eyes:     Conjunctiva/sclera: Conjunctivae normal.     Pupils: Pupils are equal, round, and reactive to light.  Cardiovascular:     Rate and Rhythm: Normal rate and regular rhythm.     Heart  sounds: Normal heart sounds.  Pulmonary:     Effort: Pulmonary effort is normal.     Breath sounds: Normal breath sounds.  Abdominal:     General: Bowel sounds are normal.     Palpations: Abdomen is soft.  Musculoskeletal:     Cervical back: Normal range of motion and neck supple.     Right lower leg: No edema.     Left lower leg: No edema.  Skin:    General: Skin is warm and dry.  Neurological:     Mental Status: She is alert.  Psychiatric:  Mood and Affect: Mood normal.     Labs reviewed: Basic Metabolic Panel: Recent Labs    07/15/23 1122  NA 139  K 4.7  CL 103  CO2 28  GLUCOSE 90  BUN 16  CREATININE 0.79  CALCIUM 9.7   Liver Function Tests: Recent Labs    07/15/23 1122  AST 16  ALT 14  BILITOT 0.4  PROT 6.5   No results for input(s): "LIPASE", "AMYLASE" in the last 8760 hours. No results for input(s): "AMMONIA" in the last 8760 hours. CBC: Recent Labs    07/15/23 1122  WBC 5.6  NEUTROABS 2,761  HGB 12.7  HCT 38.6  MCV 94.8  PLT 229   Lipid Panel: Recent Labs    07/15/23 1122  CHOL 196  HDL 73  LDLCALC 104*  TRIG 98  CHOLHDL 2.7   TSH: No results for input(s): "TSH" in the last 8760 hours. A1C: No results found for: "HGBA1C"   Assessment/Plan 1. Gastroesophageal reflux disease without esophagitis (Primary) Stable at this time, continues to follow up with GI  2. Anxiety Improved since moving her [place] facility to help with her care Continues on effexor BID  3. Mixed hyperlipidemia -continues on crestor 5 mg daily - Lipid panel - COMPLETE METABOLIC PANEL WITHOUT GFR - CBC with Differential/Platelet  4. Insomnia, unspecified type -stable at this time, continue currne regimen  5. Osteopenia of multiple sites -Recommended to take calcium 600 mg twice daily with Vitamin D 2000 units daily and weight bearing activity 30 mins/5 days a week  6. Adverse effect of anesthetic, initial encounter -improving after slow  recovery from anesthesia from colonoscopy she does not think she will have another elective screening/procedure.    Return in about 6 months (around 07/14/2024) for routine follow up, labs at time of visit.  Janene Harvey. Biagio Borg [facility] [phone]

## 2024-01-14 LAB — COMPLETE METABOLIC PANEL WITHOUT GFR
AG Ratio: 1.6 (calc) (ref 1.0–2.5)
ALT: 16 U/L (ref 6–29)
AST: 16 U/L (ref 10–35)
Albumin: 4.2 g/dL (ref 3.6–5.1)
Alkaline phosphatase (APISO): 55 U/L (ref 37–153)
BUN: 14 mg/dL (ref 7–25)
CO2: 30 mmol/L (ref 20–32)
Calcium: 9.5 mg/dL (ref 8.6–10.4)
Chloride: 100 mmol/L (ref 98–110)
Creat: 0.67 mg/dL (ref 0.60–1.00)
Globulin: 2.6 g/dL (ref 1.9–3.7)
Glucose, Bld: 91 mg/dL (ref 65–99)
Potassium: 4.7 mmol/L (ref 3.5–5.3)
Sodium: 137 mmol/L (ref 135–146)
Total Bilirubin: 0.5 mg/dL (ref 0.2–1.2)
Total Protein: 6.8 g/dL (ref 6.1–8.1)

## 2024-01-14 LAB — CBC WITH DIFFERENTIAL/PLATELET
Absolute Lymphocytes: 2256 {cells}/uL (ref 850–3900)
Absolute Monocytes: 494 {cells}/uL (ref 200–950)
Basophils Absolute: 59 {cells}/uL (ref 0–200)
Basophils Relative: 0.9 %
Eosinophils Absolute: 98 {cells}/uL (ref 15–500)
Eosinophils Relative: 1.5 %
HCT: 41.5 % (ref 35.0–45.0)
Hemoglobin: 13.6 g/dL (ref 11.7–15.5)
MCH: 30.7 pg (ref 27.0–33.0)
MCHC: 32.8 g/dL (ref 32.0–36.0)
MCV: 93.7 fL (ref 80.0–100.0)
MPV: 12.4 fL (ref 7.5–12.5)
Monocytes Relative: 7.6 %
Neutro Abs: 3595 {cells}/uL (ref 1500–7800)
Neutrophils Relative %: 55.3 %
Platelets: 264 10*3/uL (ref 140–400)
RBC: 4.43 10*6/uL (ref 3.80–5.10)
RDW: 12.5 % (ref 11.0–15.0)
Total Lymphocyte: 34.7 %
WBC: 6.5 10*3/uL (ref 3.8–10.8)

## 2024-01-14 LAB — LIPID PANEL
Cholesterol: 207 mg/dL — ABNORMAL HIGH (ref <200)
HDL: 77 mg/dL (ref >=50)
LDL Cholesterol (Calc): 106 mg/dL — ABNORMAL HIGH
Non-HDL Cholesterol (Calc): 130 mg/dL — ABNORMAL HIGH (ref <130)
Total CHOL/HDL Ratio: 2.7 (calc) (ref <5.0)
Triglycerides: 126 mg/dL (ref <150)

## 2024-01-16 ENCOUNTER — Encounter: Payer: Self-pay | Admitting: Nurse Practitioner

## 2024-01-16 ENCOUNTER — Other Ambulatory Visit: Payer: Medicare Other

## 2024-01-19 ENCOUNTER — Encounter: Payer: Self-pay | Admitting: Nurse Practitioner

## 2024-01-19 ENCOUNTER — Telehealth: Payer: Self-pay | Admitting: *Deleted

## 2024-01-19 ENCOUNTER — Ambulatory Visit: Payer: Medicare Other | Admitting: Nurse Practitioner

## 2024-01-19 DIAGNOSIS — Z0000 Encounter for general adult medical examination without abnormal findings: Secondary | ICD-10-CM

## 2024-01-19 NOTE — Patient Instructions (Addendum)
  Ms. Shidler , Thank you for taking time to come for your Medicare Wellness Visit. I appreciate your ongoing commitment to your health goals. Please review the following plan we discussed and let me know if I can assist you in the future.   TDAP at local pharmacy   This is a list of the screening recommended for you and due dates:  Health Maintenance  Topic Date Due   DTaP/Tdap/Td vaccine (1 - Tdap) Never done   Flu Shot  05/04/2024   Medicare Annual Wellness Visit  01/18/2025   Mammogram  03/02/2025   Colon Cancer Screening  12/29/2026   Pneumonia Vaccine  Completed   DEXA scan (bone density measurement)  Completed   Hepatitis C Screening  Completed   Zoster (Shingles) Vaccine  Completed   HPV Vaccine  Aged Out   Meningitis B Vaccine  Aged Out   COVID-19 Vaccine  Discontinued

## 2024-01-19 NOTE — Progress Notes (Signed)
 Subjective:   Theresa Pena is a 73 y.o. female who presents for Medicare Annual (Subsequent) preventive examination.  Visit Complete: In person    Cardiac Risk Factors include: advanced age (>43men, >1 women)     Objective:    There were no vitals filed for this visit. There is no height or weight on file to calculate BMI.     01/19/2024    9:53 AM 01/06/2023   10:43 AM 06/03/2022    2:55 PM 11/27/2021   10:12 AM 09/30/2021   10:41 AM 08/21/2021    9:11 AM 05/22/2021   10:34 AM  Advanced Directives  Does Patient Have a Medical Advance Directive? Yes Yes Yes Yes Yes Yes Yes  Type of Estate agent of [place];Living will Healthcare Power of Attorney Living will;Healthcare Power of [employer] Power of Attorney Living will Living will Healthcare Power of [place];Living will  Does patient want to make changes to medical advance directive? No - Patient declined Yes (MAU/Ambulatory/Procedural Areas - Information given) No - Patient declined No - Patient declined No - Patient declined No - Patient declined No - Patient declined  Copy of Healthcare Power of Attorney in Chart? No - copy requested Yes - validated most recent copy scanned in chart (See row information) No - copy requested Yes - validated most recent copy scanned in chart (See row information)   No - copy requested    Current Medications (verified) Outpatient Encounter Medications as of 01/19/2024  Medication Sig   Cholecalciferol (VITAMIN D3 PO) Take 500 Units by mouth daily.   Cyanocobalamin (VITAMIN B 12 PO) Take 1 tablet by mouth.   famotidine (PEPCID) 20 MG tablet Take 20 mg by mouth at bedtime.   fish oil-omega-3 fatty acids 1000 MG capsule Take 1 g by mouth daily.    LORazepam (ATIVAN) 0.5 MG tablet Take 1 tablet (0.5 mg total) by mouth daily as needed for anxiety.   Misc Natural Products (GLUCOSAMINE CHONDROITIN ADV PO) Take 1 tablet by mouth 2 (two) times daily.   Multiple Vitamin  (MULTIVITAMIN) tablet Take 1 tablet by mouth daily.   pantoprazole (PROTONIX) 40 MG tablet Take 1 tablet (40 mg total) by mouth daily.   rosuvastatin (CRESTOR) 5 MG tablet TAKE 1 TABLET(5 MG) BY MOUTH DAILY   triamcinolone cream (KENALOG) 0.1 % SMARTSIG:1 Application Topical 2-3 Times Daily   UNABLE TO FIND Med Name: Bone Up Calcium  supplement 333.3 mg , 1-2 times daily   venlafaxine (EFFEXOR) 75 MG tablet TAKE 1 TABLET(75 MG) BY MOUTH TWICE DAILY   zolpidem (AMBIEN) 5 MG tablet TAKE 1/2 TABLET(2.5 MG) BY MOUTH AT BEDTIME AS NEEDED FOR SLEEP   No facility-administered encounter medications on file as of 01/19/2024.    Allergies (verified) Codeine and Omeprazole   History: Past Medical History:  Diagnosis Date   Cancer (HCC) 2001   breast    Cataracts, bilateral    GERD (gastroesophageal reflux disease)    Hyperlipidemia    Past Surgical History:  Procedure Laterality Date   BREAST SURGERY     COLONOSCOPY     RADIAL KERATOTOMY     Family History  Problem Relation Age of Onset   Hyperlipidemia Mother    Alzheimer's disease Sister    Esophageal cancer Cousin 67   Colon cancer Neg Hx    Liver disease Neg Hx    Colon polyps Neg Hx    Social History   Socioeconomic History   Marital status: Divorced  Spouse name: Not on file   Number of children: 0   Years of education: Not on file   Highest education level: Not on file  Occupational History   Occupation: house cleaner  Tobacco Use   Smoking status: Former    Current packs/day: 0.00    Types: Cigarettes    Quit date: 10/04/1990    Years since quitting: 33.3   Smokeless tobacco: Never  Vaping Use   Vaping status: Never Used  Substance and Sexual Activity   Alcohol use: Yes    Alcohol/week: 8.0 standard drinks of alcohol    Types: 8 Glasses of wine per week    Comment: occ   Drug use: Never   Sexual activity: Not on file  Other Topics Concern   Not on file  Social History Narrative   Diet:      Do you  drink/ eat things with caffeine? Coffee 2-3 cups /day      Marital status:  Divorced                             What year were you married ? 2004      Do you live in a house, apartment,assistred living, condo, trailer, etc.)? House      Is it one or more stories? 1      How many persons live in your home ? 1      Do you have any pets in your home ?(please list)  1 dog      Current or past profession: Residential cleaning      Do you exercise?                              Type & how often: Rarely      Do you have a living will? Yes      Do you have a DNR form?   No                    If not, do you want to discuss one? Yes      Do you have signed POA?HPOA forms?   Yes              If so, please bring to your        appointment      Social Drivers of Health   Financial Resource Strain: Low Risk  (09/23/2017)   Overall Financial Resource Strain (CARDIA)    Difficulty of Paying Living Expenses: Not hard at all  Food Insecurity: No Food Insecurity (01/19/2024)   Hunger Vital Sign    Worried About Running Out of Food in the Last Year: Never true    Ran Out of Food in the Last Year: Never true  Transportation Needs: No Transportation Needs (01/19/2024)   PRAPARE - Administrator, Civil Service (Medical): No    Lack of Transportation (Non-Medical): No  Physical Activity: Sufficiently Active (09/23/2017)   Exercise Vital Sign    Days of Exercise per Week: 4 days    Minutes of Exercise per Session: 60 min  Stress: No Stress Concern Present (09/23/2017)   [employer] of Occupational Health - Occupational Stress Questionnaire    Feeling of Stress : Only a little  Social Connections: Moderately Isolated (09/23/2017)   Social Connection and Isolation Panel [NHANES]    Frequency of Communication with Friends and  Family: Twice a week    Frequency of Social Gatherings with Friends and Family: Once a week    Attends Religious Services: Never    Database administrator  or Organizations: No    Attends Engineer, structural: Never    Marital Status: Divorced    Tobacco Counseling Counseling given: Not Answered   Clinical Intake:  Pre-visit preparation completed: Yes  Pain : No/denies pain     BMI - recorded: 27 Nutritional Status: BMI 25 -29 Overweight Nutritional Risks: None Diabetes: No  How often do you need to have someone help you when you read instructions, pamphlets, or other written materials from your doctor or pharmacy?: 1 - Never         Activities of Daily Living    01/19/2024    9:53 AM  In your present state of health, do you have any difficulty performing the following activities:  Hearing? 0  Vision? 1  Difficulty concentrating or making decisions? 0  Walking or climbing stairs? 0  Dressing or bathing? 0  Doing errands, shopping? 0  Preparing Food and eating ? N  Using the Toilet? N  In the past six months, have you accidently leaked urine? Y  Do you have problems with loss of bowel control? N  Managing your Medications? N  Managing your Finances? N  Housekeeping or managing your Housekeeping? N    Patient Care Team: Verma Gobble, NP as PCP - General (Geriatric Medicine) Dale Dubonnet, OD as Referring Physician (Optometry) Eduardo Grade, MD as Consulting Physician (Dermatology)  Indicate any recent Medical Services you may have received from other than [facility] providers in the past year (date may be approximate).     Assessment:   This is a routine wellness examination for [place].  Hearing/Vision screen Vision Screening - Comments:: In Process of Finding a New Eye Dr.  Last Exam: 01/2023   Goals Addressed   None    Depression Screen    01/19/2024    9:56 AM 01/13/2024   11:09 AM 01/06/2023   10:40 AM 06/04/2022   10:35 AM 11/27/2021   10:28 AM 08/21/2021    9:06 AM 05/22/2021   10:34 AM  PHQ 2/9 Scores  PHQ - 2 Score 0 0 0 0 0 0 0  PHQ- 9 Score  2         Fall Risk     01/19/2024    9:55 AM 07/15/2023   11:03 AM 01/06/2023   10:40 AM 06/04/2022   10:35 AM 11/27/2021   10:27 AM  Fall Risk   Falls in the past year? 0 0 0 0 0  Number falls in past yr: 0 0 0 0 0  Injury with Fall? 0 0 0 0 0  Risk for fall due to : No Fall Risks No Fall Risks No Fall Risks No Fall Risks No Fall Risks  Follow up Falls evaluation completed Falls evaluation completed Falls evaluation completed Falls evaluation completed Falls evaluation completed    MEDICARE RISK AT HOME: Medicare Risk at Home Any stairs in or around the home?: No Home free of loose throw rugs in walkways, pet beds, electrical cords, etc?: Yes Adequate lighting in your home to reduce risk of falls?: Yes Life alert?: No Use of a cane, walker or w/c?: No Grab bars in the bathroom?: Yes Shower chair or bench in shower?: No Elevated toilet seat or a handicapped toilet?: No  TIMED UP AND GO:  Was the test  performed?  No    Cognitive Function:    09/29/2018   10:10 AM 09/23/2017    9:41 AM 06/18/2016    3:03 PM  MMSE - Mini Mental State Exam  Orientation to time 5 5 5   Orientation to Place 5 5 5   Registration 3 3 3   Attention/ Calculation 5 5 5   Recall 3 2 2   Language- name 2 objects 2 2 2   Language- repeat 1 1 1   Language- follow 3 step command 3 3 3   Language- read & follow direction 1 1 1   Write a sentence 1 1 1   Copy design 1 1 1   Total score 30 29 29         01/19/2024    9:56 AM 01/06/2023   10:44 AM 08/21/2021    9:11 AM 10/19/2019   10:02 AM  6CIT Screen  What Year? 0 points 0 points 0 points 0 points  What month? 0 points 0 points 0 points 0 points  What time? 0 points 0 points 0 points 0 points  Count back from 20 0 points 0 points 0 points 0 points  Months in reverse 0 points 0 points 0 points 0 points  Repeat phrase 0 points 2 points 0 points 0 points  Total Score 0 points 2 points 0 points 0 points    Immunizations Immunization History  Administered Date(s) Administered    Influenza, High Dose Seasonal PF 08/18/2018   Influenza, Quadrivalent, Recombinant, Inj, Pf 08/13/2021   Influenza,inj,Quad PF,6+ Mos 06/18/2016, 09/23/2017   Influenza-Unspecified 09/01/2018, 08/10/2021   Moderna SARS-COV2 Booster Vaccination 08/01/2020   Moderna Sars-Covid-2 Vaccination 11/09/2019, 12/12/2019   Pneumococcal Conjugate-13 06/18/2016   Pneumococcal Polysaccharide-23 09/23/2017   Unspecified SARS-COV-2 Vaccination 11/09/2019, 12/12/2019, 08/01/2020   Zoster Recombinant(Shingrix) 08/13/2021, 11/04/2021   Zoster, Live 06/05/2015    TDAP status: Due, Education has been provided regarding the importance of this vaccine. Advised may receive this vaccine at local pharmacy or Health Dept. Aware to provide a copy of the vaccination record if obtained from local pharmacy or Health Dept. Verbalized acceptance and understanding.  Flu Vaccine status: Up to date  Pneumococcal vaccine status: Up to date  Covid-19 vaccine status: Information provided on how to obtain vaccines.   Qualifies for Shingles Vaccine? Yes   Zostavax completed No   Shingrix Completed?: Yes  Screening Tests Health Maintenance  Topic Date Due   DTaP/Tdap/Td (1 - Tdap) Never done   INFLUENZA VACCINE  05/04/2024   Medicare Annual Wellness (AWV)  01/18/2025   MAMMOGRAM  03/02/2025   Colonoscopy  12/29/2026   Pneumonia Vaccine 29+ Years old  Completed   DEXA SCAN  Completed   Hepatitis C Screening  Completed   Zoster Vaccines- Shingrix  Completed   HPV VACCINES  Aged Out   Meningococcal B Vaccine  Aged Out   COVID-19 Vaccine  Discontinued    Health Maintenance  Health Maintenance Due  Topic Date Due   DTaP/Tdap/Td (1 - Tdap) Never done    Colorectal cancer screening: Type of screening: Colonoscopy. Completed 12/2023. Repeat every 3 years  Mammogram status: Completed 03/03/2023. Repeat every year  Bone Density status: Completed 2024. Results reflect: Bone density results: NORMAL. Repeat every 5  years.  Lung Cancer Screening: (Low Dose CT Chest recommended if Age 67-80 years, 20 pack-year currently smoking OR have quit w/in 15years.) does not qualify.   Lung Cancer Screening Referral: na  Additional Screening:  Hepatitis C Screening: does qualify; Completed   Vision Screening: Recommended  annual ophthalmology exams for early detection of glaucoma and other disorders of the eye. Is the patient up to date with their annual eye exam?  Yes  Who is the provider or what is the name of the office in which the patient attends annual eye exams? [email or web address] If pt is not established with a provider, would they like to be referred to a provider to establish care? No .   Dental Screening: Recommended annual dental exams for proper oral hygiene   Community Resource Referral / Chronic Care Management: CRR required this visit?  No   CCM required this visit?  No     Plan:     I have personally reviewed and noted the following in the patient's chart:   Medical and social history Use of alcohol, tobacco or illicit drugs  Current medications and supplements including opioid prescriptions. Patient is not currently taking opioid prescriptions. Functional ability and status Nutritional status Physical activity Advanced directives List of other physicians Hospitalizations, surgeries, and ER visits in previous 12 months Vitals Screenings to include cognitive, depression, and falls Referrals and appointments  In addition, I have reviewed and discussed with patient certain preventive protocols, quality metrics, and best practice recommendations. A written personalized care plan for preventive services as well as general preventive health recommendations were provided to patient.     Verma Gobble, NP   01/19/2024   After Visit Summary: (MyChart) Due to this being a telephonic visit, the after visit summary with patients personalized plan was offered to patient via MyChart

## 2024-01-19 NOTE — Progress Notes (Signed)
  This service is provided via telemedicine  No vital signs collected/recorded due to the encounter was a telemedicine visit.   Location of patient (ex: home, work):  Home  Patient consents to a telephone visit:  Yes  Location of the provider (ex: office, home):  Office [place].   Name of any referring provider:  na  Names of all persons participating in the telemedicine service and their role in the encounter:  Theresa Pena, patient, Almira Armour Dellanira Dillow, CMA, [facility], NP   Time spent on call:  7:11

## 2024-01-19 NOTE — Telephone Encounter (Signed)
 Ms. lunabelle, [place]ley are scheduled for a virtual visit with your provider today.    Just as we do with appointments in the office, we must obtain your consent to participate.  Your consent will be active for this visit and any virtual visit you Sharyah Bostwick have with one of our providers in the next 365 days.    If you have a MyChart account, I can also send a copy of this consent to you electronically.  All virtual visits are billed to your insurance company just like a traditional visit in the office.  As this is a virtual visit, video technology does not allow for your provider to perform a traditional examination.  This Tailor Lucking limit your provider's ability to fully assess your condition.  If your provider identifies any concerns that need to be evaluated in person or the need to arrange testing such as labs, EKG, etc, we will make arrangements to do so.    Although advances in technology are sophisticated, we cannot ensure that it will always work on either your end or our end.  If the connection with a video visit is poor, we Okie Bogacz have to switch to a telephone visit.  With either a video or telephone visit, we are not always able to ensure that we have a secure connection.   I need to obtain your verbal consent now.   Are you willing to proceed with your visit today?   Mirza Kidney has provided verbal consent on 01/19/2024 for a virtual visit (video or telephone).   MayBethena Brothers, [place] 01/19/2024  10:05 AM

## 2024-01-25 ENCOUNTER — Ambulatory Visit: Admitting: Gastroenterology

## 2024-02-06 ENCOUNTER — Ambulatory Visit: Payer: Self-pay

## 2024-02-06 NOTE — Telephone Encounter (Signed)
 Called and spoke with patient. Scheduled a virtual appointment with Theresa Pena for tomorrow.

## 2024-02-06 NOTE — Telephone Encounter (Signed)
 Chief Complaint: covid positive Symptoms: sore throat, congestion, cough,  Frequency: 3-4 days Pertinent Negatives: Patient denies SOB Disposition: [] ED /[] Urgent Care (no appt availability in office) / [] Appointment(In office/virtual)/ []  [place] [facility]/ [x] Home Care/ [] Refused Recommended Disposition /[]  Mobile Bus/ []  Follow-up with PCP Additional Notes: Pt states she just returned from a cruise. Pt states that she started to feel ill about 3-4 days ago. Pt states that she is feeling better today than yesterday but states that she would like to have cough syrup rx'ed, declines appt at this time.    Copied from CRM [id number]. Topic: Clinical - Red Word Triage >> Feb 06, 2024 11:47 AM Arlie Benedict B wrote: [facility] that prompted transfer to Nurse Triage: Tested postive after being on a cruise, low grade fever wanting to speak with someone about a possible decongestant Reason for Disposition  [1] COVID-19 diagnosed by positive lab test (e.g., PCR, rapid self-test kit) AND [2] mild symptoms (e.g., cough, fever, others) AND [3] no complications or SOB  Answer Assessment - Initial Assessment Questions 1. COVID-19 DIAGNOSIS: "How do you know that you have COVID?" (e.g., positive lab test or self-test, diagnosed by doctor or NP/PA, symptoms after exposure).     Positive at-home covid test 2. COVID-19 EXPOSURE: "Was there any known exposure to COVID before the symptoms began?" CDC Definition of close contact: within 6 feet (2 meters) for a total of 15 minutes or more over a 24-hour period.      Was on cruise 3. ONSET: "When did the COVID-19 symptoms start?"      2 days ago 4. WORST SYMPTOM: "What is your worst symptom?" (e.g., cough, fever, shortness of breath, muscle aches)     Congestion and throat pain 5. COUGH: "Do you have a cough?" If Yes, ask: "How bad is the cough?"       Cough is a 9 today 6. FEVER: "Do you have a fever?" If Yes, ask: "What is your temperature, how was it  measured, and when did it start?"     99 7. RESPIRATORY STATUS: "Describe your breathing?" (e.g., normal; shortness of breath, wheezing, unable to speak)      denies 8. BETTER-SAME-WORSE: "Are you getting better, staying the same or getting worse compared to yesterday?"  If getting worse, ask, "In what way?"     Better today than yesterday  9. OTHER SYMPTOMS: "Do you have any other symptoms?"  (e.g., chills, fatigue, headache, loss of smell or taste, muscle pain, sore throat)     Sore throat, congestion,  10. HIGH RISK DISEASE: "Do you have any chronic medical problems?" (e.g., asthma, heart or lung disease, weak immune system, obesity, etc.)       denies 11. VACCINE: "Have you had the COVID-19 vaccine?" If Yes, ask: "Which one, how many shots, when did you get it?"       Denies covid vaccine this season 21. O2 SATURATION MONITOR:  "Do you use an oxygen saturation monitor (pulse oximeter) at home?" If Yes, ask "What is your reading (oxygen level) today?" "What is your usual oxygen saturation reading?" (e.g., 95%)       97  Protocols used: Coronavirus (COVID-19) Diagnosed or Suspected-A-AH

## 2024-02-07 ENCOUNTER — Telehealth: Payer: Self-pay | Admitting: *Deleted

## 2024-02-07 ENCOUNTER — Ambulatory Visit (INDEPENDENT_AMBULATORY_CARE_PROVIDER_SITE_OTHER): Admitting: Nurse Practitioner

## 2024-02-07 ENCOUNTER — Encounter: Payer: Self-pay | Admitting: Nurse Practitioner

## 2024-02-07 VITALS — HR 96 | Temp 98.8°F

## 2024-02-07 DIAGNOSIS — U071 COVID-19: Secondary | ICD-10-CM | POA: Diagnosis not present

## 2024-02-07 NOTE — Telephone Encounter (Signed)
 Ms. Theresa Pena, Theresa Pena are scheduled for a virtual visit with your provider today.    Just as we do with appointments in the office, we must obtain your consent to participate.  Your consent will be active for this visit and any virtual visit you Theresa Pena have with one of our providers in the next 365 days.    If you have a MyChart account, I can also send a copy of this consent to you electronically.  All virtual visits are billed to your insurance company just like a traditional visit in the office.  As this is a virtual visit, video technology does not allow for your provider to perform a traditional examination.  This Theresa Pena limit your provider's ability to fully assess your condition.  If your provider identifies any concerns that need to be evaluated in person or the need to arrange testing such as labs, EKG, etc, we will make arrangements to do so.    Although advances in technology are sophisticated, we cannot ensure that it will always work on either your end or our end.  If the connection with a video visit is poor, we Theresa Pena have to switch to a telephone visit.  With either a video or telephone visit, we are not always able to ensure that we have a secure connection.   I need to obtain your verbal consent now.   Are you willing to proceed with your visit today?   Theresa Pena has provided verbal consent on 02/07/2024 for a virtual visit (video or telephone).   Theresa Pena, [place] 02/07/2024  9:08 AM

## 2024-02-07 NOTE — Patient Instructions (Addendum)
 Netipot or saline wash daily Flonase  1 spray into both nares twice daily as needed  Plain nasal saline spray throughout the day as needed May use tylenol  325 mg 2 tablets every 6-8 hours as needed aches and pains or sore throat humidifier in the home to help with the dry air Mucinex DM by mouth twice daily as needed for cough and chest congestion with full glass of water  Keep well hydrated Proper nutrition  Avoid forcefully blowing nose Vit c 1000 mg twice daily Vit d 2000 units daily

## 2024-02-07 NOTE — Progress Notes (Signed)
 This service is provided via telemedicine  No vital signs collected/recorded due to the encounter was a telemedicine visit.   Location of patient (ex: home, work):  Home  Patient consents to a telephone visit:  Yes  Location of the provider (ex: office, home):  Office [place].   Name of any referring provider:  na  Names of all persons participating in the telemedicine service and their role in the encounter:  Iola [place], Patient, Rexie Catena, CMA, [facility], NP  Time spent on call:  7:11

## 2024-02-07 NOTE — Progress Notes (Signed)
 Careteam: Patient Care Team: Verma Gobble, NP as PCP - General (Geriatric Medicine) Dale Dubonnet, OD as Referring Physician (Optometry) Eduardo Grade, MD as Consulting Physician (Dermatology)  Advanced Directive information    Allergies  Allergen Reactions   Codeine Nausea Only    Also has dizziness   Omeprazole Other (See Comments)    GI upset     Chief Complaint  Patient presents with   Covid Positive    Tested Positive for Covid Yesterday. Congestion, Sore Throat.      Discussed the use of AI scribe software for clinical note transcription with the patient, who gave verbal consent to proceed.  History of Present Illness Theresa Pena is a 74 year old female who presents with symptoms following a positive COVID-19 test.  She tested positive for COVID-19 yesterday after experiencing symptoms 4 days ago. Initially, she felt very sick upon waking on Saturday with a sore throat, which has since improved. She also had a headache on Saturday and Sunday, but not today.  She returned from a family cruise on Sunday(3 days ago), during which she felt unwell and took cold medicine and Tylenol . She has not had a fever today, with the highest recorded temperature being 28F. She has experienced ear pain since Monday, and her ears remain tender. She also reports congestion and a cough, which is starting to produce phlegm.  Her current medications include Tylenol , zinc , vitamin C, and vitamin D. She has taken cough syrup in the past for COVID-19 symptoms. She has not taken Mucinex before and cannot take Sudafed due to its strength.  She mentions not sleeping well during the trip but has been able to sleep once she falls asleep.    Review of Systems:  Review of Systems  Constitutional:  Positive for malaise/fatigue. Negative for chills, fever and weight loss.  HENT:  Positive for congestion, ear pain and sore throat. Negative for tinnitus.   Respiratory:  Positive  for cough. Negative for sputum production and shortness of breath.   Cardiovascular:  Negative for chest pain, palpitations and leg swelling.  Gastrointestinal:  Negative for abdominal pain, constipation, diarrhea and heartburn.  Genitourinary:  Negative for dysuria, frequency and urgency.  Musculoskeletal:  Negative for back pain, falls, joint pain and myalgias.  Skin: Negative.   Neurological:  Positive for headaches (better). Negative for dizziness.  Psychiatric/Behavioral:  Negative for depression and memory loss. The patient does not have insomnia.     Past Medical History:  Diagnosis Date   Cancer (HCC) 2001   breast    Cataracts, bilateral    GERD (gastroesophageal reflux disease)    Hyperlipidemia    Past Surgical History:  Procedure Laterality Date   BREAST SURGERY     COLONOSCOPY     RADIAL KERATOTOMY     Social History:   reports that she quit smoking about 33 years ago. Her smoking use included cigarettes. She has never used smokeless tobacco. She reports current alcohol use of about 8.0 standard drinks of alcohol per week. She reports that she does not use drugs.  Family History  Problem Relation Age of Onset   Hyperlipidemia Mother    Alzheimer's disease Sister    Esophageal cancer Cousin 60   Colon cancer Neg Hx    Liver disease Neg Hx    Colon polyps Neg Hx     Medications: Patient's Medications  New Prescriptions   No medications on file  Previous Medications   CHOLECALCIFEROL (VITAMIN D3 PO)  Take 500 Units by mouth daily.   CYANOCOBALAMIN (VITAMIN B 12 PO)    Take 1 tablet by mouth.   FAMOTIDINE  (PEPCID ) 20 MG TABLET    Take 20 mg by mouth at bedtime.   FISH OIL-OMEGA-3 FATTY ACIDS 1000 MG CAPSULE    Take 1 g by mouth daily.    LORAZEPAM  (ATIVAN ) 0.5 MG TABLET    Take 1 tablet (0.5 mg total) by mouth daily as needed for anxiety.   MISC NATURAL PRODUCTS (GLUCOSAMINE CHONDROITIN ADV PO)    Take 1 tablet by mouth 2 (two) times daily.   MULTIPLE  VITAMIN (MULTIVITAMIN) TABLET    Take 1 tablet by mouth daily.   PANTOPRAZOLE  (PROTONIX ) 40 MG TABLET    Take 1 tablet (40 mg total) by mouth daily.   ROSUVASTATIN  (CRESTOR ) 5 MG TABLET    TAKE 1 TABLET(5 MG) BY MOUTH DAILY   TRIAMCINOLONE CREAM (KENALOG) 0.1 %    SMARTSIG:1 Application Topical 2-3 Times Daily   UNABLE TO FIND    Med Name: Bone Up Calcium   supplement 333.3 mg , 1-2 times daily   VENLAFAXINE  (EFFEXOR ) 75 MG TABLET    TAKE 1 TABLET(75 MG) BY MOUTH TWICE DAILY   ZOLPIDEM  (AMBIEN ) 5 MG TABLET    TAKE 1/2 TABLET(2.5 MG) BY MOUTH AT BEDTIME AS NEEDED FOR SLEEP  Modified Medications   No medications on file  Discontinued Medications   No medications on file    Physical Exam:  Vitals:   02/07/24 0838  Pulse: 96  Temp: 98.8 F (37.1 C)   There is no height or weight on file to calculate BMI. Wt Readings from Last 3 Encounters:  01/13/24 176 lb (79.8 kg)  12/29/23 179 lb (81.2 kg)  10/20/23 179 lb (81.2 kg)    Physical Exam Constitutional:      Appearance: Normal appearance.  Pulmonary:     Effort: Pulmonary effort is normal.  Neurological:     Mental Status: She is alert and oriented to person, place, and time. Mental status is at baseline.  Psychiatric:        Mood and Affect: Mood normal.     Labs reviewed: Basic Metabolic Panel: Recent Labs    07/15/23 1122 01/13/24 1147  NA 139 137  K 4.7 4.7  CL 103 100  CO2 28 30  GLUCOSE 90 91  BUN 16 14  CREATININE 0.79 0.67  CALCIUM  9.7 9.5   Liver Function Tests: Recent Labs    07/15/23 1122 01/13/24 1147  AST 16 16  ALT 14 16  BILITOT 0.4 0.5  PROT 6.5 6.8   No results for input(s): "LIPASE", "AMYLASE" in the last 8760 hours. No results for input(s): "AMMONIA" in the last 8760 hours. CBC: Recent Labs    07/15/23 1122 01/13/24 1147  WBC 5.6 6.5  NEUTROABS 2,761 3,595  HGB 12.7 13.6  HCT 38.6 41.5  MCV 94.8 93.7  PLT 229 264   Lipid Panel: Recent Labs    07/15/23 1122 01/13/24 1147   CHOL 196 207*  HDL 73 77  LDLCALC 104* 106*  TRIG 98 126  CHOLHDL 2.7 2.7   TSH: No results for input(s): "TSH" in the last 8760 hours. A1C: No results found for: "HGBA1C"   Assessment/Plan 1. COVID-19 (Primary) Symptoms doing better today.  Netipot or saline wash daily Flonase  1 spray into both nares twice daily as needed  Plain nasal saline spray throughout the day as needed May use tylenol  325 mg 2 tablets  every 6-8 hours as needed aches and pains or sore throat humidifier in the home to help with the dry air Mucinex DM by mouth twice daily as needed for cough and chest congestion with full glass of water  Keep well hydrated Proper nutrition  Avoid forcefully blowing nose Vit c 1000 mg twice daily Vit d 2000 units daily    Kainen Struckman K. Denney Fisherman  [facility] [phone]    Virtual Visit via video  I connected with patient on 02/07/24 at  9:00 AM EDT by mychart and verified that I am speaking with the correct person using two identifiers.  Location: Patient: home  Provider: [facility]   I discussed the limitations, risks, security and privacy concerns of performing an evaluation and management service by telephone and the availability of in person appointments. I also discussed with the patient that there may be a patient responsible charge related to this service. The patient expressed understanding and agreed to proceed.   I discussed the assessment and treatment plan with the patient. The patient was provided an opportunity to ask questions and all were answered. The patient agreed with the plan and demonstrated an understanding of the instructions.   The patient was advised to call back or seek an in-person evaluation if the symptoms worsen or if the condition fails to improve as anticipated.  I provided 15 minutes of non-face-to-face time during this encounter.  Sarah Baez K. Denney Fisherman Avs printed and mailed

## 2024-02-13 ENCOUNTER — Ambulatory Visit: Admitting: Adult Health

## 2024-02-13 ENCOUNTER — Ambulatory Visit: Payer: Self-pay | Admitting: Nurse Practitioner

## 2024-02-13 ENCOUNTER — Encounter: Payer: Self-pay | Admitting: Adult Health

## 2024-02-13 VITALS — BP 122/76 | HR 80 | Temp 96.6°F | Resp 18 | Ht 67.0 in | Wt 173.0 lb

## 2024-02-13 DIAGNOSIS — K219 Gastro-esophageal reflux disease without esophagitis: Secondary | ICD-10-CM

## 2024-02-13 DIAGNOSIS — F419 Anxiety disorder, unspecified: Secondary | ICD-10-CM

## 2024-02-13 DIAGNOSIS — H6690 Otitis media, unspecified, unspecified ear: Secondary | ICD-10-CM

## 2024-02-13 MED ORDER — CEFDINIR 300 MG PO CAPS
300.0000 mg | ORAL_CAPSULE | Freq: Two times a day (BID) | ORAL | 0 refills | Status: AC
Start: 2024-02-13 — End: 2024-02-18

## 2024-02-13 NOTE — Telephone Encounter (Signed)
 Constant ear congestion since last Sunday  Symptoms: Intermittent pain  Disposition:  [x] Appointment (In office)  Additional Notes: Pt was seen in office on 02/07/24 by Gilbert Lab, NP, and was positive for COVID. Pt states her ears have been clogged since flying back on an airplane one week ago. Pt scheduled for an appointment today in office. This RN educated pt on new-worsening symptoms and when to call back/seek emergent care. Pt verbalized understanding and agrees to plan.    Copied from CRM [id number]. Topic: Clinical - Red Word Triage >> Feb 13, 2024 11:29 AM Danelle Dunning F wrote: [facility] that prompted transfer to Nurse Triage:   New ear pain in both ears; head cold possibly; Covid test now reads negative Reason for Disposition  Earache  Answer Assessment - Initial Assessment Questions Constant ear congestion since last Sunday  Symptoms: Intermittent pain  Protocols used: Sinus Pain or Congestion-A-AH

## 2024-02-13 NOTE — Progress Notes (Unsigned)
 [facility] clinic  Provider:  Inge Mangle DNP  Code Status:  Full Code   Goals of Care:     01/19/2024    9:53 AM  Advanced Directives  Does Patient Have a Medical Advance Directive? Yes  Type of Estate agent of [place];Living will  Does patient want to make changes to medical advance directive? No - Patient declined  Copy of Healthcare Power of Attorney in Chart? No - copy requested     Chief Complaint  Patient presents with   Ear Pain    earache    HPI: Patient is a 73 y.o. female seen today for an acute visit for Discussed the use of AI scribe software for clinical note transcription with the patient, who gave verbal consent to proceed.     Past Medical History:  Diagnosis Date   Cancer (HCC) 2001   breast    Cataracts, bilateral    GERD (gastroesophageal reflux disease)    Hyperlipidemia     Past Surgical History:  Procedure Laterality Date   BREAST SURGERY     COLONOSCOPY     RADIAL KERATOTOMY      Allergies  Allergen Reactions   Codeine Nausea Only    Also has dizziness   Omeprazole Other (See Comments)    GI upset     Outpatient Encounter Medications as of 02/13/2024  Medication Sig   Cholecalciferol (VITAMIN D3 PO) Take 500 Units by mouth daily.   Cyanocobalamin (VITAMIN B 12 PO) Take 1 tablet by mouth.   famotidine  (PEPCID ) 20 MG tablet Take 20 mg by mouth at bedtime.   fish oil-omega-3 fatty acids 1000 MG capsule Take 1 g by mouth daily.    LORazepam  (ATIVAN ) 0.5 MG tablet Take 1 tablet (0.5 mg total) by mouth daily as needed for anxiety.   Misc Natural Products (GLUCOSAMINE CHONDROITIN ADV PO) Take 1 tablet by mouth 2 (two) times daily.   Multiple Vitamin (MULTIVITAMIN) tablet Take 1 tablet by mouth daily.   pantoprazole  (PROTONIX ) 40 MG tablet Take 1 tablet (40 mg total) by mouth daily.   rosuvastatin  (CRESTOR ) 5 MG tablet TAKE 1 TABLET(5 MG) BY MOUTH DAILY   triamcinolone cream (KENALOG) 0.1 % SMARTSIG:1  Application Topical 2-3 Times Daily   UNABLE TO FIND Med Name: Bone Up Calcium   supplement 333.3 mg , 1-2 times daily   venlafaxine  (EFFEXOR ) 75 MG tablet TAKE 1 TABLET(75 MG) BY MOUTH TWICE DAILY   zolpidem  (AMBIEN ) 5 MG tablet TAKE 1/2 TABLET(2.5 MG) BY MOUTH AT BEDTIME AS NEEDED FOR SLEEP   No facility-administered encounter medications on file as of 02/13/2024.    Review of Systems:  Review of Systems  Constitutional:  Negative for appetite change, chills, fatigue and fever.  HENT:  Positive for congestion and ear pain. Negative for ear discharge, hearing loss, rhinorrhea, sore throat and tinnitus.   Eyes: Negative.   Respiratory:  Negative for cough, shortness of breath and wheezing.   Cardiovascular:  Negative for chest pain, palpitations and leg swelling.  Gastrointestinal:  Negative for abdominal pain, constipation, diarrhea, nausea and vomiting.  Genitourinary:  Negative for dysuria.  Musculoskeletal:  Negative for arthralgias, back pain and myalgias.  Skin:  Negative for color change, rash and wound.  Neurological:  Negative for dizziness, weakness and headaches.  Psychiatric/Behavioral:  Negative for behavioral problems. The patient is not nervous/anxious.     Health Maintenance  Topic Date Due   DTaP/Tdap/Td (1 - Tdap) Never done   INFLUENZA VACCINE  05/04/2024   Medicare Annual Wellness (AWV)  01/18/2025   MAMMOGRAM  03/02/2025   Colonoscopy  12/29/2026   Pneumonia Vaccine 22+ Years old  Completed   DEXA SCAN  Completed   Hepatitis C Screening  Completed   Zoster Vaccines- Shingrix  Completed   HPV VACCINES  Aged Out   Meningococcal B Vaccine  Aged Out   COVID-19 Vaccine  Discontinued    Physical Exam: Vitals:   02/13/24 1447  BP: 122/76  Pulse: 80  Resp: 18  Temp: (!) 96.6 F (35.9 C)  SpO2: 97%  Weight: 173 lb (78.5 kg)  Height: 5\' 7"  (1.702 m)   Body mass index is 27.1 kg/m. Physical Exam Constitutional:      Appearance: Normal appearance.  HENT:      Head: Normocephalic and atraumatic.     Ears:     Comments: Right ear with erythema    Nose: Nose normal.     Mouth/Throat:     Mouth: Mucous membranes are moist.  Eyes:     Conjunctiva/sclera: Conjunctivae normal.  Cardiovascular:     Rate and Rhythm: Normal rate and regular rhythm.  Pulmonary:     Effort: Pulmonary effort is normal.     Breath sounds: Normal breath sounds.  Abdominal:     General: Bowel sounds are normal.     Palpations: Abdomen is soft.  Musculoskeletal:        General: Normal range of motion.     Cervical back: Normal range of motion.  Skin:    General: Skin is warm and dry.  Neurological:     General: No focal deficit present.     Mental Status: She is alert and oriented to person, place, and time.  Psychiatric:        Mood and Affect: Mood normal.        Behavior: Behavior normal.        Thought Content: Thought content normal.        Judgment: Judgment normal.     Labs reviewed: Basic Metabolic Panel: Recent Labs    07/15/23 1122 01/13/24 1147  NA 139 137  K 4.7 4.7  CL 103 100  CO2 28 30  GLUCOSE 90 91  BUN 16 14  CREATININE 0.79 0.67  CALCIUM  9.7 9.5   Liver Function Tests: Recent Labs    07/15/23 1122 01/13/24 1147  AST 16 16  ALT 14 16  BILITOT 0.4 0.5  PROT 6.5 6.8   No results for input(s): "LIPASE", "AMYLASE" in the last 8760 hours. No results for input(s): "AMMONIA" in the last 8760 hours. CBC: Recent Labs    07/15/23 1122 01/13/24 1147  WBC 5.6 6.5  NEUTROABS 2,761 3,595  HGB 12.7 13.6  HCT 38.6 41.5  MCV 94.8 93.7  PLT 229 264   Lipid Panel: Recent Labs    07/15/23 1122 01/13/24 1147  CHOL 196 207*  HDL 73 77  LDLCALC 104* 106*  TRIG 98 126  CHOLHDL 2.7 2.7   No results found for: "HGBA1C"  Procedures since last visit: No results found.  Assessment/Plan     Labs/tests ordered:  None   No follow-ups on file.  Deshan Hemmelgarn Medina-Vargas, NP

## 2024-03-08 DIAGNOSIS — Z1231 Encounter for screening mammogram for malignant neoplasm of breast: Secondary | ICD-10-CM | POA: Diagnosis not present

## 2024-03-08 LAB — HM MAMMOGRAPHY

## 2024-03-13 ENCOUNTER — Encounter: Payer: Self-pay | Admitting: Nurse Practitioner

## 2024-03-15 ENCOUNTER — Ambulatory Visit: Admitting: Gastroenterology

## 2024-03-15 ENCOUNTER — Encounter: Payer: Self-pay | Admitting: Gastroenterology

## 2024-03-15 VITALS — BP 116/72 | HR 72 | Ht 67.0 in | Wt 175.0 lb

## 2024-03-15 DIAGNOSIS — K2100 Gastro-esophageal reflux disease with esophagitis, without bleeding: Secondary | ICD-10-CM

## 2024-03-15 DIAGNOSIS — K449 Diaphragmatic hernia without obstruction or gangrene: Secondary | ICD-10-CM

## 2024-03-15 DIAGNOSIS — R1013 Epigastric pain: Secondary | ICD-10-CM

## 2024-03-15 DIAGNOSIS — Z860101 Personal history of adenomatous and serrated colon polyps: Secondary | ICD-10-CM

## 2024-03-15 NOTE — Patient Instructions (Signed)
 Please follow up as needed.  _______________________________________________________  If your blood pressure at your visit was 140/90 or greater, please contact your primary care physician to follow up on this.  _______________________________________________________  If you are age 74 or older, your body mass index should be between 23-30. Your Body mass index is 27.41 kg/m. If this is out of the aforementioned range listed, please consider follow up with your Primary Care Provider.  If you are age 46 or younger, your body mass index should be between 19-25. Your Body mass index is 27.41 kg/m. If this is out of the aformentioned range listed, please consider follow up with your Primary Care Provider.   ________________________________________________________  The [facility] providers would like to encourage you to use MYCHART to communicate with providers for non-urgent requests or questions.  Due to long hold times on the telephone, sending your provider a message by [facility] may be a faster and more efficient way to get a response.  Please allow 48 business hours for a response.  Please remember that this is for non-urgent requests.  _______________________________________________________

## 2024-03-15 NOTE — Progress Notes (Signed)
 [facility] Progress Note  Chief Complaint: GERD and hiatal hernia  Subjective  Prior history  Screening colonoscopy with Dr. Dominic Friendly April 2018 -2 subcentimeter tubular adenomas EGD with Dr. Dominic Friendly April 2018 for evaluation of persistent heartburn.  1 to 2 cm hiatal hernia, otherwise normal exam.  Office consult with Dr. Dominic Friendly January 2025 for polyp surveillance and also lower chest/epigastric burning and episodic sharp upper abdominal pain.  Ultrasound negative for gallstones.  Suggested possible fatty liver, LFTs and CBC normal Colonoscopy March 2025 removed a diminutive tubular adenoma and a 15 mm descending colon tubular adenoma (piecemeal)-3-year recall recommended EGD March 2025 with a 7 cm hiatal hernia, EG junction inflamed mucosa, biopsy showing GERD related inflammation without Barrett's or malignancy.  H. pylori negative  Patient takes H2 blocker and sees famotidine ) and reports side effects of prior trial of GI  ____________________  Theresa Pena reports doing well since I last saw her, with little in the way of heartburn or regurgitation.  Even the episodic sharp epigastric pain she was having has diminished.  Bowel movements regular without rectal bleeding.  She wanted to discuss long-term management of the hiatal hernia, reflux and questions regarding meds.   ROS: Cardiovascular:  no chest pain Respiratory: no dyspnea  The patient's Past Medical, Family and Social History were reviewed and are on file in the EMR. Past Medical History:  Diagnosis Date   Cancer (HCC) 2001   breast    Cataracts, bilateral    GERD (gastroesophageal reflux disease)    Hyperlipidemia     Past Surgical History:  Procedure Laterality Date   BREAST SURGERY     COLONOSCOPY     RADIAL KERATOTOMY       Objective:  Med list reviewed  Current Outpatient Medications:    Cholecalciferol (VITAMIN D3 PO), Take 500 Units by mouth daily., Disp: , Rfl:    Cyanocobalamin (VITAMIN B 12 PO),  Take 1 tablet by mouth., Disp: , Rfl:    famotidine  (PEPCID ) 20 MG tablet, Take 20 mg by mouth at bedtime., Disp: , Rfl:    fish oil-omega-3 fatty acids 1000 MG capsule, Take 1 g by mouth daily. , Disp: , Rfl:    LORazepam  (ATIVAN ) 0.5 MG tablet, Take 1 tablet (0.5 mg total) by mouth daily as needed for anxiety., Disp: 10 tablet, Rfl: 0   Misc Natural Products (GLUCOSAMINE CHONDROITIN ADV PO), Take 1 tablet by mouth 2 (two) times daily., Disp: , Rfl:    Multiple Vitamin (MULTIVITAMIN) tablet, Take 1 tablet by mouth daily., Disp: , Rfl:    rosuvastatin  (CRESTOR ) 5 MG tablet, TAKE 1 TABLET(5 MG) BY MOUTH DAILY, Disp: 90 tablet, Rfl: 3   triamcinolone cream (KENALOG) 0.1 %, SMARTSIG:1 Application Topical 2-3 Times Daily, Disp: , Rfl:    UNABLE TO FIND, Med Name: Bone Up Calcium   supplement 333.3 mg , 1-2 times daily, Disp: , Rfl:    venlafaxine  (EFFEXOR ) 75 MG tablet, TAKE 1 TABLET(75 MG) BY MOUTH TWICE DAILY, Disp: 180 tablet, Rfl: 3   zolpidem  (AMBIEN ) 5 MG tablet, TAKE 1/2 TABLET(2.5 MG) BY MOUTH AT BEDTIME AS NEEDED FOR SLEEP, Disp: 30 tablet, Rfl: 2   pantoprazole  (PROTONIX ) 40 MG tablet, Take 1 tablet (40 mg total) by mouth daily. (Patient not taking: Reported on 03/15/2024), Disp: 30 tablet, Rfl: 3   Vital signs in last 24 hrs: Vitals:   03/15/24 0826  BP: 116/72  Pulse: 72   Wt Readings from Last 3 Encounters:  03/15/24 175  lb (79.4 kg)  02/13/24 173 lb (78.5 kg)  01/13/24 176 lb (79.8 kg)    Physical Exam  Well-appearing No exam-entire visit spent in review of symptoms, results and plan   Labs:    Latest Ref Rng & Units 01/13/2024   11:47 AM 07/15/2023   11:22 AM 01/07/2023    3:25 PM  CBC  WBC 3.8 - 10.8 Thousand/uL 6.5  5.6  7.2   Hemoglobin 11.7 - 15.5 g/dL 30.8  65.7  84.6   Hematocrit 35.0 - 45.0 % 41.5  38.6  38.7   Platelets 140 - 400 Thousand/uL 264  229  241       Latest Ref Rng & Units 01/13/2024   11:47 AM 07/15/2023   11:22 AM 01/07/2023    3:25 PM  CMP   Glucose 65 - 99 mg/dL 91  90  88   BUN 7 - 25 mg/dL 14  16  15    Creatinine 0.60 - 1.00 mg/dL 9.62  9.52  8.41   Sodium 135 - 146 mmol/L 137  139  138   Potassium 3.5 - 5.3 mmol/L 4.7  4.7  4.5   Chloride 98 - 110 mmol/L 100  103  103   CO2 20 - 32 mmol/L 30  28  28    Calcium  8.6 - 10.4 mg/dL 9.5  9.7  9.4   Total Protein 6.1 - 8.1 g/dL 6.8  6.5  6.8   Total Bilirubin 0.2 - 1.2 mg/dL 0.5  0.4  0.3   AST 10 - 35 U/L 16  16  16    ALT 6 - 29 U/L 16  14  16       ___________________________________________ Radiologic studies:  CLINICAL DATA:  Abdominal pain   EXAM: ULTRASOUND ABDOMEN LIMITED RIGHT UPPER QUADRANT   COMPARISON:  None Available.   FINDINGS: Gallbladder:   No gallstones or wall thickening visualized. No sonographic Murphy sign noted by sonographer.   Common bile duct:   Diameter: 4 mm   Liver:   Increased echogenicity. No focal lesion. Portal vein is patent on color Doppler imaging with normal direction of blood flow towards the liver.   Other: None.   IMPRESSION: 1. Increased hepatic parenchymal echogenicity suggestive of steatosis. 2. No cholelithiasis or sonographic evidence for acute cholecystitis.     Electronically Signed   By: Jone Neither M.D.   On: 01/06/2024 11:38 ____________________________________________ Other: March 2025 endoscopic biopsy reports  1. Surgical [P], random gastric :      - MILD REACTIVE GASTROPATHY.      - NEGATIVE FOR H. PYLORI ON H&E STAIN      - NO INTESTINAL METAPLASIA, DYSPLASIA, OR MALIGNANCY.       2. Surgical [P], GE junction :      - ACUTE NONSPECIFIC ESOPHAGITIS      - NEGATIVE FOR INTESTINAL METAPLASIA, DYSPLASIA OR MALIGNANCY       3. Surgical [P], colon, ascending, polyp (1) :      - TUBULAR ADENOMA.      - NO HIGH GRADE DYSPLASIA OR MALIGNANCY.       4. Surgical [P], colon, descending, polyp (1) :      - TUBULAR ADENOMA.      - NO HIGH GRADE DYSPLASIA OR MALIGNANCY.       Diagnosis Note : -  Special stain PAS is negative for fungal organisms.Special      stains for CMV and HSV are also negative.Controls are adequate.   _____________________________________________  Encounter Diagnoses  Name Primary?   Gastroesophageal reflux disease with esophagitis without hemorrhage Yes   Hiatal hernia    Epigastric pain    Hx of adenomatous colonic polyps     Assessment and plan  GERD with mild esophagitis at the EG junction, biopsies benign.  She has intermittent symptoms and is not sure she wants to be on long-term acid suppression medicine. My opinion is that with the esophagitis seen, she could progress to a stricture at some point unless she is taking acid suppression on some regular basis.  Since she has had side effects from PPI, I recommended famotidine  20 mg once daily and she felt comfortable with that.  Hiatal hernia contributing to her GERD, no Cameron erosions/anemia. It could still be causing some of the episodic epigastric pain she has had.  We talked some about hiatal hernia also using diagram of the anatomy or demonstration. She does not appear to need consideration of surgery at this point, we discussed how that could be an option later on if she has escalating symptoms that are either not responsive to medicines where she does not wish to take long-term antacids.  3-year recall for colon polyps recently discovered.  She will see me as needed    Kerby Pearson III

## 2024-03-23 DIAGNOSIS — R928 Other abnormal and inconclusive findings on diagnostic imaging of breast: Secondary | ICD-10-CM | POA: Diagnosis not present

## 2024-03-23 DIAGNOSIS — R590 Localized enlarged lymph nodes: Secondary | ICD-10-CM | POA: Diagnosis not present

## 2024-03-26 ENCOUNTER — Encounter: Payer: Self-pay | Admitting: Nurse Practitioner

## 2024-03-30 ENCOUNTER — Other Ambulatory Visit: Payer: Self-pay

## 2024-03-30 DIAGNOSIS — N6332 Unspecified lump in axillary tail of the left breast: Secondary | ICD-10-CM | POA: Diagnosis not present

## 2024-03-30 DIAGNOSIS — I899 Noninfective disorder of lymphatic vessels and lymph nodes, unspecified: Secondary | ICD-10-CM | POA: Diagnosis not present

## 2024-04-02 LAB — SURGICAL PATHOLOGY

## 2024-04-12 ENCOUNTER — Ambulatory Visit: Payer: Self-pay | Admitting: Nurse Practitioner

## 2024-05-21 ENCOUNTER — Other Ambulatory Visit: Payer: Self-pay | Admitting: Nurse Practitioner

## 2024-05-21 DIAGNOSIS — F39 Unspecified mood [affective] disorder: Secondary | ICD-10-CM

## 2024-05-21 DIAGNOSIS — E782 Mixed hyperlipidemia: Secondary | ICD-10-CM

## 2024-06-19 ENCOUNTER — Other Ambulatory Visit: Payer: Self-pay | Admitting: Nurse Practitioner

## 2024-06-19 DIAGNOSIS — G4700 Insomnia, unspecified: Secondary | ICD-10-CM

## 2024-06-19 NOTE — Telephone Encounter (Signed)
 Patient has request for refill on Ambien .  Patient last refill was 05/22/2024. Patient has contract on file dated 11/22/2023. Patient has upcoming appointment 07/27/2024. Updated contract/Sign contract was added to appointment notes. Medication pend and sent to PCP Arch, Jessica K, NP) for approval.

## 2024-06-28 DIAGNOSIS — R928 Other abnormal and inconclusive findings on diagnostic imaging of breast: Secondary | ICD-10-CM | POA: Diagnosis not present

## 2024-07-06 ENCOUNTER — Other Ambulatory Visit: Payer: Self-pay

## 2024-07-06 DIAGNOSIS — M7989 Other specified soft tissue disorders: Secondary | ICD-10-CM | POA: Diagnosis not present

## 2024-07-06 DIAGNOSIS — R590 Localized enlarged lymph nodes: Secondary | ICD-10-CM | POA: Diagnosis not present

## 2024-07-09 LAB — SURGICAL PATHOLOGY

## 2024-07-20 DIAGNOSIS — H5203 Hypermetropia, bilateral: Secondary | ICD-10-CM | POA: Diagnosis not present

## 2024-07-20 DIAGNOSIS — H43813 Vitreous degeneration, bilateral: Secondary | ICD-10-CM | POA: Diagnosis not present

## 2024-07-20 DIAGNOSIS — H2513 Age-related nuclear cataract, bilateral: Secondary | ICD-10-CM | POA: Diagnosis not present

## 2024-07-20 DIAGNOSIS — H25013 Cortical age-related cataract, bilateral: Secondary | ICD-10-CM | POA: Diagnosis not present

## 2024-07-20 DIAGNOSIS — H04123 Dry eye syndrome of bilateral lacrimal glands: Secondary | ICD-10-CM | POA: Diagnosis not present

## 2024-07-20 DIAGNOSIS — H52203 Unspecified astigmatism, bilateral: Secondary | ICD-10-CM | POA: Diagnosis not present

## 2024-07-27 ENCOUNTER — Encounter: Payer: Self-pay | Admitting: Nurse Practitioner

## 2024-07-27 ENCOUNTER — Ambulatory Visit: Admitting: Nurse Practitioner

## 2024-07-27 VITALS — BP 122/76 | HR 65 | Temp 97.8°F | Resp 19 | Ht 67.0 in | Wt 173.8 lb

## 2024-07-27 DIAGNOSIS — F39 Unspecified mood [affective] disorder: Secondary | ICD-10-CM

## 2024-07-27 DIAGNOSIS — M8589 Other specified disorders of bone density and structure, multiple sites: Secondary | ICD-10-CM

## 2024-07-27 DIAGNOSIS — G4700 Insomnia, unspecified: Secondary | ICD-10-CM

## 2024-07-27 DIAGNOSIS — Z23 Encounter for immunization: Secondary | ICD-10-CM

## 2024-07-27 DIAGNOSIS — E782 Mixed hyperlipidemia: Secondary | ICD-10-CM

## 2024-07-27 DIAGNOSIS — F419 Anxiety disorder, unspecified: Secondary | ICD-10-CM

## 2024-07-27 DIAGNOSIS — K219 Gastro-esophageal reflux disease without esophagitis: Secondary | ICD-10-CM

## 2024-07-27 LAB — LIPID PANEL
Cholesterol: 205 mg/dL — ABNORMAL HIGH (ref <200)
HDL: 75 mg/dL (ref >=50)
LDL Cholesterol (Calc): 107 mg/dL — ABNORMAL HIGH
Non-HDL Cholesterol (Calc): 130 mg/dL — ABNORMAL HIGH (ref <130)
Total CHOL/HDL Ratio: 2.7 (calc) (ref <5.0)
Triglycerides: 124 mg/dL (ref <150)

## 2024-07-27 LAB — CBC WITH DIFFERENTIAL/PLATELET
Absolute Lymphocytes: 2246 {cells}/uL (ref 850–3900)
Absolute Monocytes: 553 {cells}/uL (ref 200–950)
Basophils Absolute: 29 {cells}/uL (ref 0–200)
Basophils Relative: 0.5 %
Eosinophils Absolute: 91 {cells}/uL (ref 15–500)
Eosinophils Relative: 1.6 %
HCT: 41 % (ref 35.0–45.0)
Hemoglobin: 13.6 g/dL (ref 11.7–15.5)
MCH: 30.8 pg (ref 27.0–33.0)
MCHC: 33.2 g/dL (ref 32.0–36.0)
MCV: 93 fL (ref 80.0–100.0)
MPV: 12.4 fL (ref 7.5–12.5)
Monocytes Relative: 9.7 %
Neutro Abs: 2782 {cells}/uL (ref 1500–7800)
Neutrophils Relative %: 48.8 %
Platelets: 242 Thousand/uL (ref 140–400)
RBC: 4.41 Million/uL (ref 3.80–5.10)
RDW: 12.6 % (ref 11.0–15.0)
Total Lymphocyte: 39.4 %
WBC: 5.7 Thousand/uL (ref 3.8–10.8)

## 2024-07-27 LAB — COMPREHENSIVE METABOLIC PANEL WITH GFR
AG Ratio: 1.7 (calc) (ref 1.0–2.5)
ALT: 14 U/L (ref 6–29)
AST: 18 U/L (ref 10–35)
Albumin: 4.3 g/dL (ref 3.6–5.1)
Alkaline phosphatase (APISO): 60 U/L (ref 37–153)
BUN: 13 mg/dL (ref 7–25)
CO2: 28 mmol/L (ref 20–32)
Calcium: 9.6 mg/dL (ref 8.6–10.4)
Chloride: 102 mmol/L (ref 98–110)
Creat: 0.78 mg/dL (ref 0.60–1.00)
Globulin: 2.5 g/dL (ref 1.9–3.7)
Glucose, Bld: 92 mg/dL (ref 65–99)
Potassium: 4.6 mmol/L (ref 3.5–5.3)
Sodium: 138 mmol/L (ref 135–146)
Total Bilirubin: 0.5 mg/dL (ref 0.2–1.2)
Total Protein: 6.8 g/dL (ref 6.1–8.1)
eGFR: 80 mL/min/1.73m2 (ref >=60)

## 2024-07-27 NOTE — Progress Notes (Signed)
 Careteam: Patient Care Team: Caro Harlene POUR, NP as PCP - General (Geriatric Medicine) Paul Barrio, OD as Referring Physician (Optometry) Ivin Kocher, MD as Consulting Physician (Dermatology)  PLACE OF SERVICE:  [facility]  Advanced Directive information Does Patient Have a Medical Advance Directive?: Yes, Type of Advance Directive: Healthcare Power of [place];Living will, Does patient want to make changes to medical advance directive?: No - Patient declined  Allergies  Allergen Reactions   Codeine Nausea Only    Also has dizziness   Omeprazole Other (See Comments)    GI upset     Chief Complaint  Patient presents with   Medical Management of Chronic Issues    6 month follow-up, labs, and sign treatment agreement for Ambien  and Ativan  (pended). Discussed need for flu vaccine and td/tdap, [facility] verified.     HPI:  Discussed the use of AI scribe software for clinical note transcription with the patient, who gave verbal consent to proceed.  History of Present Illness Theresa Pena is a 73 year old female who presents for a six-month follow-up  She underwent a diagnostic mammogram and biopsy following an abnormal finding on a routine mammogram. The initial biopsy was inconclusive, leading to a second biopsy after a three-month interval, which showed no concerning results and to continue mammograms yearly   She had COVID-19 in early May and continues to experience residual symptoms, including ear congestion. She attributes some of these symptoms to allergies, which have been different this year.  Her anxiety has improved with Effexor  75 mg daily. She uses Ativan  rarely, sometimes taking half a tablet in anticipation of stress, particularly when around her sister Theresa Pena (who has dementia and she is her primary person). She still has six tablets left from the original prescription of ten.  She experiences sleep disturbances, often waking up at 3 AM. She uses  Ambien , sometimes taking more than prescribed, especially after her cousin's unexpected death, which has added stress to her life. She goes to bed around 10:30 or 11 PM but finds it difficult to return to sleep if she wakes up.  She experiences indigestion. The first few bites of food can be painful, but the discomfort is not prolonged enough to warrant medication. She uses Protonix  and famotidine  occasionally, trying to minimize her use to avoid dependency.    Review of Systems:  Review of Systems  Constitutional:  Negative for chills, fever and weight loss.  HENT:  Negative for tinnitus.   Respiratory:  Negative for cough, sputum production and shortness of breath.   Cardiovascular:  Negative for chest pain, palpitations and leg swelling.  Gastrointestinal:  Positive for heartburn. Negative for abdominal pain, constipation and diarrhea.  Genitourinary:  Negative for dysuria, frequency and urgency.  Musculoskeletal:  Negative for back pain, falls, joint pain and myalgias.  Skin: Negative.   Neurological:  Negative for dizziness and headaches.  Psychiatric/Behavioral:  Negative for depression and memory loss. The patient is nervous/anxious and has insomnia.     Past Medical History:  Diagnosis Date   Cancer (HCC) 2001   breast    Cataracts, bilateral    GERD (gastroesophageal reflux disease)    Hyperlipidemia    Lymph nodes enlarged    Past Surgical History:  Procedure Laterality Date   BREAST SURGERY     COLONOSCOPY     RADIAL KERATOTOMY     Social History:   reports that she quit smoking about 33 years ago. Her smoking use included cigarettes. She has  never used smokeless tobacco. She reports current alcohol use of about 8.0 standard drinks of alcohol per week. She reports that she does not use drugs.  Family History  Problem Relation Age of Onset   Hyperlipidemia Mother    Alzheimer's disease Sister    Esophageal cancer Cousin 56   Colon cancer Neg Hx    Liver disease  Neg Hx    Colon polyps Neg Hx     Medications: Patient's Medications  New Prescriptions   No medications on file  Previous Medications   CHOLECALCIFEROL (VITAMIN D3 PO)    Take 500 Units by mouth daily.   CYANOCOBALAMIN (VITAMIN B 12 PO)    Take 1 tablet by mouth.   FAMOTIDINE  (PEPCID ) 20 MG TABLET    Take 20 mg by mouth at bedtime.   FISH OIL-OMEGA-3 FATTY ACIDS 1000 MG CAPSULE    Take 1 g by mouth daily.    LORAZEPAM  (ATIVAN ) 0.5 MG TABLET    Take 1 tablet (0.5 mg total) by mouth daily as needed for anxiety.   MISC NATURAL PRODUCTS (GLUCOSAMINE CHONDROITIN ADV PO)    Take 1 tablet by mouth 2 (two) times daily.   MULTIPLE VITAMIN (MULTIVITAMIN) TABLET    Take 1 tablet by mouth daily.   PANTOPRAZOLE  (PROTONIX ) 40 MG TABLET    Take 1 tablet (40 mg total) by mouth daily.   ROSUVASTATIN  (CRESTOR ) 5 MG TABLET    TAKE 1 TABLET(5 MG) BY MOUTH DAILY   TRIAMCINOLONE CREAM (KENALOG) 0.1 %    SMARTSIG:1 Application Topical 2-3 Times Daily   UNABLE TO FIND    Med Name: Bone Up Calcium   supplement 333.3 mg , 1-2 times daily   VENLAFAXINE  (EFFEXOR ) 75 MG TABLET    TAKE 1 TABLET(75 MG) BY MOUTH TWICE DAILY   ZOLPIDEM  (AMBIEN ) 5 MG TABLET    TAKE 1/2 TABLET(2.5 MG) BY MOUTH AT BEDTIME AS NEEDED FOR SLEEP  Modified Medications   No medications on file  Discontinued Medications   No medications on file    Physical Exam:  Vitals:   07/27/24 1108  BP: 122/76  Pulse: 65  Resp: 19  Temp: 97.8 F (36.6 C)  SpO2: 99%  Weight: 173 lb 12.8 oz (78.8 kg)  Height: 5' 7 (1.702 m)   Body mass index is 27.22 kg/m. Wt Readings from Last 3 Encounters:  07/27/24 173 lb 12.8 oz (78.8 kg)  03/15/24 175 lb (79.4 kg)  02/13/24 173 lb (78.5 kg)    Physical Exam Constitutional:      General: She is not in acute distress.    Appearance: She is well-developed. She is not diaphoretic.  HENT:     Head: Normocephalic and atraumatic.     Mouth/Throat:     Pharynx: No oropharyngeal exudate.  Eyes:      Conjunctiva/sclera: Conjunctivae normal.     Pupils: Pupils are equal, round, and reactive to light.  Cardiovascular:     Rate and Rhythm: Normal rate and regular rhythm.     Heart sounds: Normal heart sounds.  Pulmonary:     Effort: Pulmonary effort is normal.     Breath sounds: Normal breath sounds.  Abdominal:     General: Bowel sounds are normal.     Palpations: Abdomen is soft.  Musculoskeletal:     Cervical back: Normal range of motion and neck supple.     Right lower leg: No edema.     Left lower leg: No edema.  Skin:  General: Skin is warm and dry.  Neurological:     Mental Status: She is alert.  Psychiatric:        Mood and Affect: Mood normal.     Labs reviewed: Basic Metabolic Panel: Recent Labs    01/13/24 1147  NA 137  K 4.7  CL 100  CO2 30  GLUCOSE 91  BUN 14  CREATININE 0.67  CALCIUM  9.5   Liver Function Tests: Recent Labs    01/13/24 1147  AST 16  ALT 16  BILITOT 0.5  PROT 6.8   No results for input(s): LIPASE, AMYLASE in the last 8760 hours. No results for input(s): AMMONIA in the last 8760 hours. CBC: Recent Labs    01/13/24 1147  WBC 6.5  NEUTROABS 3,595  HGB 13.6  HCT 41.5  MCV 93.7  PLT 264   Lipid Panel: Recent Labs    01/13/24 1147  CHOL 207*  HDL 77  LDLCALC 106*  TRIG 126  CHOLHDL 2.7   TSH: No results for input(s): TSH in the last 8760 hours. A1C: No results found for: HGBA1C   Assessment/Plan Gastroesophageal reflux disease without esophagitis Assessment & Plan: Continues protonix  with pepcid  PRN with lifestyle modifications   Orders: -     Comprehensive metabolic panel with GFR -     CBC with Differential/Platelet  Insomnia, unspecified type Assessment & Plan: Ongoing, continue lifestyle modification and Ambien  if needed.    Mood disorder Assessment & Plan: Continue on effexor  75 mg daily with lifestyle modifications    Mixed hyperlipidemia Assessment & Plan: Continues on crestor   and lifestyle modification   Orders: -     Lipid panel -     Comprehensive metabolic panel with GFR -     CBC with Differential/Platelet  Anxiety Assessment & Plan: Ongoing but better controlled. Continue current regimen. She uses ativan  sparingly.    Osteopenia of multiple sites Assessment & Plan: Continue vit d and weigh bearing exercises    Immunization due -     Flu vaccine HIGH DOSE PF(Fluzone Trivalent)   Return in about 6 months (around 01/25/2025) for routine follow up.  Theresa Pena K. Caro BODILY [facility] [phone]

## 2024-07-28 ENCOUNTER — Ambulatory Visit: Payer: Self-pay | Admitting: Nurse Practitioner

## 2024-07-28 DIAGNOSIS — F419 Anxiety disorder, unspecified: Secondary | ICD-10-CM | POA: Insufficient documentation

## 2024-07-28 DIAGNOSIS — M8589 Other specified disorders of bone density and structure, multiple sites: Secondary | ICD-10-CM | POA: Insufficient documentation

## 2024-07-28 NOTE — Assessment & Plan Note (Signed)
 Continues on crestor  and lifestyle modification

## 2024-07-28 NOTE — Assessment & Plan Note (Signed)
 Continues protonix  with pepcid  PRN with lifestyle modifications

## 2024-07-28 NOTE — Assessment & Plan Note (Signed)
 Ongoing but better controlled. Continue current regimen. She uses ativan  sparingly.

## 2024-07-28 NOTE — Assessment & Plan Note (Signed)
 Ongoing, continue lifestyle modification and Ambien  if needed.

## 2024-07-28 NOTE — Assessment & Plan Note (Signed)
 Continue on effexor  75 mg daily with lifestyle modifications

## 2024-07-28 NOTE — Assessment & Plan Note (Signed)
 Continue vit d and weigh bearing exercises

## 2024-08-06 ENCOUNTER — Encounter: Payer: Self-pay | Admitting: Radiology

## 2024-09-12 ENCOUNTER — Other Ambulatory Visit: Payer: Self-pay | Admitting: Nurse Practitioner

## 2025-01-24 ENCOUNTER — Ambulatory Visit: Payer: Self-pay | Admitting: Nurse Practitioner

## 2025-01-25 ENCOUNTER — Ambulatory Visit: Payer: Self-pay | Admitting: Nurse Practitioner
